# Patient Record
Sex: Female | Born: 1960
Health system: Southern US, Community
[De-identification: ages and names within clinical notes are randomized; demographics above are authoritative.]

## PROBLEM LIST (undated history)

## (undated) DIAGNOSIS — N951 Menopausal and female climacteric states: Secondary | ICD-10-CM

## (undated) DIAGNOSIS — M1990 Unspecified osteoarthritis, unspecified site: Secondary | ICD-10-CM

## (undated) DIAGNOSIS — E559 Vitamin D deficiency, unspecified: Secondary | ICD-10-CM

## (undated) DIAGNOSIS — R0609 Other forms of dyspnea: Secondary | ICD-10-CM

## (undated) DIAGNOSIS — G43909 Migraine, unspecified, not intractable, without status migrainosus: Secondary | ICD-10-CM

## (undated) DIAGNOSIS — J309 Allergic rhinitis, unspecified: Secondary | ICD-10-CM

## (undated) DIAGNOSIS — R002 Palpitations: Secondary | ICD-10-CM

## (undated) DIAGNOSIS — E669 Obesity, unspecified: Secondary | ICD-10-CM

## (undated) DIAGNOSIS — R5381 Other malaise: Secondary | ICD-10-CM

## (undated) DIAGNOSIS — D239 Other benign neoplasm of skin, unspecified: Secondary | ICD-10-CM

## (undated) DIAGNOSIS — N200 Calculus of kidney: Secondary | ICD-10-CM

## (undated) DIAGNOSIS — M76899 Other specified enthesopathies of unspecified lower limb, excluding foot: Secondary | ICD-10-CM

## (undated) DIAGNOSIS — K219 Gastro-esophageal reflux disease without esophagitis: Secondary | ICD-10-CM

## (undated) DIAGNOSIS — R42 Dizziness and giddiness: Secondary | ICD-10-CM

## (undated) DIAGNOSIS — R5383 Other fatigue: Secondary | ICD-10-CM

## (undated) DIAGNOSIS — R0989 Other specified symptoms and signs involving the circulatory and respiratory systems: Secondary | ICD-10-CM

## (undated) DIAGNOSIS — I10 Essential (primary) hypertension: Secondary | ICD-10-CM

## (undated) DIAGNOSIS — G5600 Carpal tunnel syndrome, unspecified upper limb: Secondary | ICD-10-CM

## (undated) DIAGNOSIS — M5030 Other cervical disc degeneration, unspecified cervical region: Secondary | ICD-10-CM

## (undated) DIAGNOSIS — N926 Irregular menstruation, unspecified: Secondary | ICD-10-CM

## (undated) DIAGNOSIS — N83209 Unspecified ovarian cyst, unspecified side: Secondary | ICD-10-CM

## (undated) DIAGNOSIS — Z8673 Personal history of transient ischemic attack (TIA), and cerebral infarction without residual deficits: Secondary | ICD-10-CM

## (undated) DIAGNOSIS — T7840XA Allergy, unspecified, initial encounter: Secondary | ICD-10-CM

## (undated) HISTORY — DX: Other forms of dyspnea: R06.09

## (undated) HISTORY — DX: Palpitations: R00.2

## (undated) HISTORY — DX: Carpal tunnel syndrome, unspecified upper limb: G56.00

## (undated) HISTORY — DX: Other cervical disc degeneration, unspecified cervical region: M50.30

## (undated) HISTORY — DX: Obesity, unspecified: E66.9

## (undated) HISTORY — DX: Other specified enthesopathies of unspecified lower limb, excluding foot: M76.899

## (undated) HISTORY — DX: Irregular menstruation, unspecified: N92.6

## (undated) HISTORY — DX: Menopausal and female climacteric states: N95.1

## (undated) HISTORY — DX: Dizziness and giddiness: R42

## (undated) HISTORY — DX: Other benign neoplasm of skin, unspecified: D23.9

## (undated) HISTORY — DX: Personal history of transient ischemic attack (TIA), and cerebral infarction without residual deficits: Z86.73

## (undated) HISTORY — DX: Calculus of kidney: N20.0

## (undated) HISTORY — DX: Unspecified ovarian cyst, unspecified side: N83.209

## (undated) HISTORY — DX: Other specified symptoms and signs involving the circulatory and respiratory systems: R09.89

## (undated) HISTORY — DX: Essential (primary) hypertension: I10

## (undated) HISTORY — DX: Allergy, unspecified, initial encounter: T78.40XA

## (undated) HISTORY — PX: TOOTH EXTRACTION: SUR596

## (undated) HISTORY — DX: Other fatigue: R53.83

## (undated) HISTORY — DX: Migraine, unspecified, not intractable, without status migrainosus: G43.909

## (undated) HISTORY — DX: Vitamin D deficiency, unspecified: E55.9

## (undated) HISTORY — DX: Gastro-esophageal reflux disease without esophagitis: K21.9

## (undated) HISTORY — DX: Unspecified osteoarthritis, unspecified site: M19.90

## (undated) HISTORY — DX: Allergic rhinitis, unspecified: J30.9

## (undated) HISTORY — DX: Other malaise: R53.81

---

## 1987-08-04 HISTORY — PX: TUBAL LIGATION: SHX77

## 2004-09-11 ENCOUNTER — Ambulatory Visit: Payer: Self-pay | Admitting: Internal Medicine

## 2006-02-10 ENCOUNTER — Ambulatory Visit: Payer: Self-pay | Admitting: Internal Medicine

## 2006-05-28 ENCOUNTER — Ambulatory Visit: Payer: Self-pay | Admitting: Internal Medicine

## 2007-01-26 ENCOUNTER — Ambulatory Visit: Payer: Self-pay | Admitting: Internal Medicine

## 2007-08-18 ENCOUNTER — Ambulatory Visit (HOSPITAL_COMMUNITY): Admission: RE | Admit: 2007-08-18 | Discharge: 2007-08-18 | Payer: Self-pay | Admitting: Urology

## 2008-10-30 ENCOUNTER — Ambulatory Visit: Payer: Self-pay | Admitting: Internal Medicine

## 2010-01-29 ENCOUNTER — Ambulatory Visit: Payer: Self-pay | Admitting: Internal Medicine

## 2010-11-05 ENCOUNTER — Emergency Department: Payer: Self-pay | Admitting: Unknown Physician Specialty

## 2010-11-20 ENCOUNTER — Observation Stay: Payer: Self-pay | Admitting: Internal Medicine

## 2011-06-03 ENCOUNTER — Ambulatory Visit: Payer: Self-pay | Admitting: Gastroenterology

## 2011-06-04 ENCOUNTER — Ambulatory Visit: Payer: Self-pay | Admitting: Family Medicine

## 2012-06-16 ENCOUNTER — Emergency Department: Payer: Self-pay | Admitting: Emergency Medicine

## 2012-06-16 LAB — URINALYSIS, COMPLETE
Bilirubin,UR: NEGATIVE
Ketone: NEGATIVE
Ph: 7 (ref 4.5–8.0)
Protein: NEGATIVE
Specific Gravity: 1.004 (ref 1.003–1.030)
Squamous Epithelial: 2

## 2012-06-16 LAB — CBC
HCT: 41.8 % (ref 35.0–47.0)
MCV: 85 fL (ref 80–100)
Platelet: 274 10*3/uL (ref 150–440)
RBC: 4.94 10*6/uL (ref 3.80–5.20)
RDW: 14.1 % (ref 11.5–14.5)
WBC: 7.2 10*3/uL (ref 3.6–11.0)

## 2012-06-16 LAB — BASIC METABOLIC PANEL
BUN: 9 mg/dL (ref 7–18)
Chloride: 107 mmol/L (ref 98–107)
EGFR (Non-African Amer.): >60
Glucose: 96 mg/dL (ref 65–99)
Potassium: 3.5 mmol/L (ref 3.5–5.1)
Sodium: 141 mmol/L (ref 136–145)

## 2012-06-16 LAB — CK TOTAL AND CKMB (NOT AT ARMC): CK-MB: 1.6 ng/mL (ref 0.5–3.6)

## 2012-06-16 LAB — TSH: Thyroid Stimulating Horm: 1.34 u[IU]/mL

## 2012-07-05 ENCOUNTER — Observation Stay: Payer: Self-pay | Admitting: Internal Medicine

## 2012-07-05 LAB — URINALYSIS, COMPLETE
Ketone: NEGATIVE
Nitrite: NEGATIVE
RBC,UR: 1 /HPF (ref 0–5)
Specific Gravity: 1.001 (ref 1.003–1.030)
WBC UR: 1 /HPF (ref 0–5)

## 2012-07-05 LAB — BASIC METABOLIC PANEL
Anion Gap: 7 (ref 7–16)
Calcium, Total: 8.7 mg/dL (ref 8.5–10.1)
Chloride: 106 mmol/L (ref 98–107)
Co2: 24 mmol/L (ref 21–32)
EGFR (Non-African Amer.): >60
Glucose: 87 mg/dL (ref 65–99)
Osmolality: 273 (ref 275–301)
Potassium: 3.5 mmol/L (ref 3.5–5.1)
Sodium: 137 mmol/L (ref 136–145)

## 2012-07-05 LAB — CK TOTAL AND CKMB (NOT AT ARMC)
CK, Total: 54 U/L (ref 21–215)
CK-MB: 1 ng/mL (ref 0.5–3.6)

## 2012-07-05 LAB — HEPATIC FUNCTION PANEL A (ARMC)
Albumin: 3.6 g/dL (ref 3.4–5.0)
Bilirubin, Direct: <0.1 mg/dL (ref 0.00–0.20)
Bilirubin,Total: 0.4 mg/dL (ref 0.2–1.0)
SGOT(AST): 20 U/L (ref 15–37)
Total Protein: 7.4 g/dL (ref 6.4–8.2)

## 2012-07-05 LAB — CBC
HCT: 39.7 % (ref 35.0–47.0)
HGB: 13.3 g/dL (ref 12.0–16.0)
MCH: 28.5 pg (ref 26.0–34.0)
MCHC: 33.5 g/dL (ref 32.0–36.0)
MCV: 85 fL (ref 80–100)

## 2012-07-05 LAB — TROPONIN I: Troponin-I: <0.02 ng/mL

## 2012-07-06 DIAGNOSIS — I6789 Other cerebrovascular disease: Secondary | ICD-10-CM

## 2012-07-06 LAB — BASIC METABOLIC PANEL
Anion Gap: 5 — ABNORMAL LOW (ref 7–16)
Calcium, Total: 8.5 mg/dL (ref 8.5–10.1)
Chloride: 108 mmol/L — ABNORMAL HIGH (ref 98–107)
EGFR (Non-African Amer.): >60
Glucose: 96 mg/dL (ref 65–99)
Osmolality: 278 (ref 275–301)
Potassium: 4 mmol/L (ref 3.5–5.1)

## 2012-07-06 LAB — CBC WITH DIFFERENTIAL/PLATELET
Basophil #: 0.1 10*3/uL (ref 0.0–0.1)
Basophil %: 1 %
Eosinophil #: 0.3 10*3/uL (ref 0.0–0.7)
Lymphocyte %: 24.7 %
MCH: 28.2 pg (ref 26.0–34.0)
MCHC: 33.2 g/dL (ref 32.0–36.0)
Monocyte #: 0.6 x10 3/mm (ref 0.2–0.9)
Neutrophil #: 4 10*3/uL (ref 1.4–6.5)
Neutrophil %: 60.3 %
Platelet: 220 10*3/uL (ref 150–440)
RDW: 14.1 % (ref 11.5–14.5)
WBC: 6.7 10*3/uL (ref 3.6–11.0)

## 2012-07-06 LAB — LIPASE, BLOOD: Lipase: 79 U/L (ref 73–393)

## 2012-07-06 LAB — TROPONIN I: Troponin-I: <0.02 ng/mL

## 2012-07-07 LAB — SEDIMENTATION RATE: Erythrocyte Sed Rate: 11 mm/hr (ref 0–20)

## 2012-07-14 ENCOUNTER — Encounter: Payer: Self-pay | Admitting: *Deleted

## 2012-07-15 ENCOUNTER — Ambulatory Visit (INDEPENDENT_AMBULATORY_CARE_PROVIDER_SITE_OTHER): Payer: BC Managed Care – PPO | Admitting: Cardiovascular Disease

## 2012-07-15 ENCOUNTER — Encounter: Payer: Self-pay | Admitting: Cardiovascular Disease

## 2012-07-15 VITALS — BP 160/100 | HR 63 | Ht 61.0 in | Wt 202.8 lb

## 2012-07-15 DIAGNOSIS — I10 Essential (primary) hypertension: Secondary | ICD-10-CM

## 2012-07-15 DIAGNOSIS — R002 Palpitations: Secondary | ICD-10-CM | POA: Insufficient documentation

## 2012-07-15 DIAGNOSIS — R0789 Other chest pain: Secondary | ICD-10-CM

## 2012-07-15 DIAGNOSIS — R0602 Shortness of breath: Secondary | ICD-10-CM

## 2012-07-15 DIAGNOSIS — R000 Tachycardia, unspecified: Secondary | ICD-10-CM

## 2012-07-15 MED ORDER — AMLODIPINE BESYLATE 5 MG PO TABS: 5.0000 mg | ORAL_TABLET | Freq: Every day | ORAL | Status: DC

## 2012-07-15 NOTE — Progress Notes (Signed)
HPI  This is a pleasant 51 year old Caucasian female who was referred by Dr. Carlynn Purl for evaluation of palpitations and hypertension. The patient overall has been healthy throughout her life up until April of last year when she presented to [facility] with right-sided weakness slurred speech. Neurologic evaluation was unremarkable and was discharged home. She improved but had recurrent symptoms 1 week later with suspected TIA. However, neurologic evaluation was unremarkable. She was seen by Dr. Clelia Croft and was diagnosed with basal migraine. Recently, she started having problems with elevated blood pressure. She had no previous history of hypertension. This came on suddenly with blood pressure readings as high as 210 systolic on some occasions. She was started on bisoprolol- hydrochlorothiazide. She was hospitalized recently at [facility] for recurrent symptoms of weakness. She was noted to be bradycardic and thus, bisoprolol was discontinued. She was noted to have normal blood pressure during her hospitalization. She had an MRI of the brain done which was normal. An echocardiogram showed normal LV systolic function without significant structural abnormalities. Since hospital discharge, the patient noted the increased palpitations. She had one prolonged episode of palpitation while she was at church. She felt that her heart was going extremely fast and felt dizzy. She is not aware of any previous history of cardiac arrhythmia.  Allergies  Allergen Reactions  . Codeine      Current Outpatient Prescriptions on File Prior to Visit  Medication Sig Dispense Refill  . aspirin 81 MG tablet Take 81 mg by mouth as needed.       . Cholecalciferol (VITAMIN D) 2000 UNITS tablet Take 2,000 Units by mouth as needed.       . cyclobenzaprine (FLEXERIL) 10 MG tablet Take 10 mg by mouth as needed.       . hydrochlorothiazide (HYDRODIURIL) 12.5 MG tablet Take 12.5 mg by mouth daily.      Marland Kitchen ibuprofen (ADVIL,MOTRIN) 200 MG tablet  Take 200 mg by mouth every 6 (six) hours as needed.      . nortriptyline (PAMELOR) 10 MG capsule Take 10 mg by mouth at bedtime.      Marland Kitchen omeprazole (PRILOSEC) 20 MG capsule Take 20 mg by mouth daily.      . sodium chloride (OCEAN) 0.65 % nasal spray Place 1 spray into the nose as needed.      Marland Kitchen amLODipine (NORVASC) 5 MG tablet Take 1 tablet (5 mg total) by mouth daily.  30 tablet  6     Past Medical History  Diagnosis Date  . Degeneration of cervical intervertebral disc   . Calculus of kidney   . Migraine, unspecified, without mention of intractable migraine without mention of status migrainosus   . Other dyspnea and respiratory abnormality   . Unspecified vitamin D deficiency   . Migraine with aura, without mention of intractable migraine without mention of status migrainosus   . Enthesopathy of hip region   . Other malaise and fatigue   . Allergic rhinitis, cause unspecified   . Irregular menstrual cycle   . Esophageal reflux   . Benign neoplasm of skin, site unspecified   . Carpal tunnel syndrome   . Symptomatic menopausal or female climacteric states   . Obesity, unspecified   . Transient ischemic attack (TIA), and cerebral infarction without residual deficits   . Palpitations   . Essential hypertension, malignant   . Dizziness and giddiness      Past Surgical History  Procedure Date  . Tubal ligation   . Tooth  extraction      Family History  Problem Relation Age of Onset  . Asthma Mother   . Heart disease Mother   . Hyperlipidemia Mother   . Asthma Son   . Heart attack Father      History   Social History  . Marital Status: Married    Spouse Name: N/A    Number of Children: N/A  . Years of Education: N/A   Occupational History  . Not on file.   Social History Main Topics  . Smoking status: Never Smoker   . Smokeless tobacco: Not on file  . Alcohol Use: No  . Drug Use: No  . Sexually Active:    Other Topics Concern  . Not on file   Social  History Narrative  . No narrative on file     ROS Constitutional: Negative for fever, chills, diaphoresis, activity change, appetite change . HENT: Negative for hearing loss, nosebleeds, congestion, sore throat, facial swelling, drooling, trouble swallowing, neck pain, voice change, sinus pressure and tinnitus.  Eyes: Negative for photophobia, pain, discharge and visual disturbance.  Respiratory: Negative for apnea, cough, chest tightness and wheezing.  Cardiovascular: Negative for chest pain and leg swelling.  Gastrointestinal: Negative for nausea, vomiting, abdominal pain, diarrhea, constipation, blood in stool and abdominal distention.  Genitourinary: Negative for dysuria, urgency, frequency, hematuria and decreased urine volume.  Musculoskeletal: Negative for myalgias, back pain, joint swelling, arthralgias and gait problem.  Skin: Negative for color change, pallor, rash and wound.  Neurological: Negative for dizziness, tremors, seizures, syncope, speech difficulty, weakness, light-headedness, numbness and headaches.  Psychiatric/Behavioral: Negative for suicidal ideas, hallucinations, behavioral problems and agitation. The patient is not nervous/anxious.    PHYSICAL EXAM   BP 160/100  Pulse 63  Ht 5\' 1"  (1.549 m)  Wt 202 lb 12 oz (91.967 kg)  BMI 38.31 kg/m2  Constitutional: She is oriented to person, place, and time. She appears well-developed and well-nourished. No distress.  HENT: No nasal discharge.  Head: Normocephalic and atraumatic.  Eyes: Pupils are equal and round. Right eye exhibits no discharge. Left eye exhibits no discharge.  Neck: Normal range of motion. Neck supple. No JVD present. No thyromegaly present.  Cardiovascular: Normal rate, regular rhythm, normal heart sounds. Exam reveals no gallop and no friction rub. No murmur heard.  Pulmonary/Chest: Effort normal and breath sounds normal. No stridor. No respiratory distress. She has no wheezes. She has no rales.  She exhibits no tenderness.  Abdominal: Soft. Bowel sounds are normal. She exhibits no distension. There is no tenderness. There is no rebound and no guarding.  Musculoskeletal: Normal range of motion. She exhibits no edema and no tenderness.  Neurological: She is alert and oriented to person, place, and time. Coordination normal.  Skin: Skin is warm and dry. No rash noted. She is not diaphoretic. No erythema. No pallor.  Psychiatric: She has a normal mood and affect. Her behavior is normal. Judgment and thought content normal.      EKG: Sinus  Rhythm  WITHIN NORMAL LIMITS   ASSESSMENT AND PLAN

## 2012-07-15 NOTE — Patient Instructions (Addendum)
Start Amlodipine 5 mg once daily.   Your physician has requested that you have a renal artery duplex. During this test, an ultrasound is used to evaluate blood flow to the kidneys. Allow one hour for this exam. Do not eat after midnight the day before and avoid carbonated beverages. Take your medications as you usually do.  Your physician has recommended that you wear a holter monitor. Holter monitors are medical devices that record the heart's electrical activity. Doctors most often use these monitors to diagnose arrhythmias. Arrhythmias are problems with the speed or rhythm of the heartbeat. The monitor is a small, portable device. You can wear one while you do your normal daily activities. This is usually used to diagnose what is causing palpitations/syncope (passing out).  Labs today.   Follow up after tests.

## 2012-07-15 NOTE — Assessment & Plan Note (Signed)
The patient is having increased palpitations especially after stopping Bisoprolol recently due to bradycardia. She had one prolonged episode of tachycardia of unclear etiology. Recent labs were unremarkable. Echocardiogram was normal. I recommend further evaluation with a 48-hour Holter monitor.

## 2012-07-15 NOTE — Assessment & Plan Note (Signed)
This is an unusual presentation overall with labile hypertension and occasionally very high readings. She does not have family history of hypertension at this age. I definitely think we should evaluate her for secondary hypertension. I will request Aldosterone/Renin ratio. I will also obtain a renal artery duplex ultrasound to rule out fibromuscular dysplasia. Her blood pressure is significantly elevated and thus I will add amlodipine 5 mg once daily. If she continues to have significant fluctuations of her blood pressure, the next step would be to evaluate her for pheochromocytoma with 24 hour urine collection.

## 2012-07-25 ENCOUNTER — Encounter: Payer: Self-pay | Admitting: Cardiovascular Disease

## 2012-07-28 ENCOUNTER — Telehealth: Payer: Self-pay | Admitting: *Deleted

## 2012-07-28 ENCOUNTER — Encounter (INDEPENDENT_AMBULATORY_CARE_PROVIDER_SITE_OTHER): Payer: BC Managed Care – PPO

## 2012-07-28 DIAGNOSIS — I10 Essential (primary) hypertension: Secondary | ICD-10-CM

## 2012-07-28 NOTE — Telephone Encounter (Signed)
LMTCB to inform pt that she was to have 48 hour holter monitor placed on 07/28/12 after echo. Pt left before having monitor placed. Called to see when pt can come by [facility] in [place] to have monitor placed.

## 2012-08-01 ENCOUNTER — Other Ambulatory Visit: Payer: Self-pay | Admitting: *Deleted

## 2012-08-01 ENCOUNTER — Encounter (INDEPENDENT_AMBULATORY_CARE_PROVIDER_SITE_OTHER): Payer: BC Managed Care – PPO

## 2012-08-01 DIAGNOSIS — R002 Palpitations: Secondary | ICD-10-CM

## 2012-08-01 NOTE — Progress Notes (Unsigned)
Placed 48 hour holter montior 08/01/2012 to be taken off on 08/05/2011 for Dr. Kirke Corin. Palpitations.

## 2012-08-02 ENCOUNTER — Other Ambulatory Visit: Payer: Self-pay

## 2012-08-02 DIAGNOSIS — N261 Atrophy of kidney (terminal): Secondary | ICD-10-CM

## 2012-08-05 ENCOUNTER — Ambulatory Visit (HOSPITAL_COMMUNITY)
Admission: RE | Admit: 2012-08-05 | Discharge: 2012-08-05 | Disposition: A | Discharge status: Home / Self Care | Payer: BC Managed Care – PPO | Source: Ambulatory Visit | Attending: Cardiovascular Disease | Admitting: Cardiovascular Disease

## 2012-08-05 DIAGNOSIS — N269 Renal sclerosis, unspecified: Secondary | ICD-10-CM | POA: Insufficient documentation

## 2012-08-05 DIAGNOSIS — N261 Atrophy of kidney (terminal): Secondary | ICD-10-CM

## 2012-08-12 ENCOUNTER — Ambulatory Visit: Payer: Self-pay | Admitting: Family Medicine

## 2012-08-15 ENCOUNTER — Ambulatory Visit (INDEPENDENT_AMBULATORY_CARE_PROVIDER_SITE_OTHER): Payer: BC Managed Care – PPO | Admitting: Cardiovascular Disease

## 2012-08-15 ENCOUNTER — Encounter: Payer: Self-pay | Admitting: Cardiovascular Disease

## 2012-08-15 VITALS — BP 132/80 | HR 65 | Ht 62.0 in | Wt 203.0 lb

## 2012-08-15 DIAGNOSIS — R002 Palpitations: Secondary | ICD-10-CM

## 2012-08-15 DIAGNOSIS — I10 Essential (primary) hypertension: Secondary | ICD-10-CM

## 2012-08-15 NOTE — Patient Instructions (Addendum)
Continue same medications.   Follow up as needed.  

## 2012-08-16 ENCOUNTER — Encounter: Payer: Self-pay | Admitting: Cardiovascular Disease

## 2012-08-16 NOTE — Progress Notes (Signed)
HPI  This is a pleasant 52 year old Caucasian female who is here today for a followup visit regarding palpitations and hypertension. The patient overall has been healthy throughout her life up until April of last year when she presented to [facility] with right-sided weakness slurred speech. Neurologic evaluation was unremarkable and was discharged home. She improved but had recurrent symptoms 1 week later with suspected TIA. However, neurologic evaluation was unremarkable. She was seen by Dr. Sherryll Burger and was diagnosed with basal migraine. Recently, she started having problems with elevated blood pressure. She had no previous history of hypertension. This came on suddenly with blood pressure readings as high as 210 systolic on some occasions. She was started on bisoprolol- hydrochlorothiazide. She was hospitalized recently at [facility] for recurrent symptoms of weakness. She was noted to be bradycardic and thus, bisoprolol was discontinued. She was noted to have normal blood pressure during her hospitalization. She had an MRI of the brain done which was normal. An echocardiogram showed normal LV systolic function without significant structural abnormalities. Since hospital discharge, the patient noted the increased palpitations.  Thus, a Holter monitor was done which showed only some PACs and PVCs. During her last visit, her blood pressure was elevated. I added amlodipine 5 mg once daily. Since then, her blood pressure has been reasonably controlled. The patient seems to be doing well at this time. Renal artery duplex ultrasound showed no evidence of renal artery stenosis.  Allergies  Allergen Reactions  . Codeine      Current Outpatient Prescriptions on File Prior to Visit  Medication Sig Dispense Refill  . amLODipine (NORVASC) 5 MG tablet Take 1 tablet (5 mg total) by mouth daily.  30 tablet  6  . aspirin 81 MG tablet Take 81 mg by mouth as needed.       . Cholecalciferol (VITAMIN D) 2000 UNITS tablet Take  2,000 Units by mouth as needed.       . cyclobenzaprine (FLEXERIL) 10 MG tablet Take 10 mg by mouth as needed.       . hydrochlorothiazide (HYDRODIURIL) 12.5 MG tablet Take 12.5 mg by mouth daily.      Marland Kitchen ibuprofen (ADVIL,MOTRIN) 200 MG tablet Take 200 mg by mouth every 6 (six) hours as needed.      . nortriptyline (PAMELOR) 10 MG capsule Take 10 mg by mouth at bedtime.      Marland Kitchen omeprazole (PRILOSEC) 20 MG capsule Take 20 mg by mouth daily.      . sodium chloride (OCEAN) 0.65 % nasal spray Place 1 spray into the nose as needed.         Past Medical History  Diagnosis Date  . Degeneration of cervical intervertebral disc   . Calculus of kidney   . Migraine, unspecified, without mention of intractable migraine without mention of status migrainosus   . Other dyspnea and respiratory abnormality   . Unspecified vitamin D deficiency   . Migraine with aura, without mention of intractable migraine without mention of status migrainosus   . Enthesopathy of hip region   . Other malaise and fatigue   . Allergic rhinitis, cause unspecified   . Irregular menstrual cycle   . Esophageal reflux   . Benign neoplasm of skin, site unspecified   . Carpal tunnel syndrome   . Symptomatic menopausal or female climacteric states   . Obesity, unspecified   . Transient ischemic attack (TIA), and cerebral infarction without residual deficits   . Palpitations   . Essential hypertension, malignant   .  Dizziness and giddiness   . Ovarian cyst      Past Surgical History  Procedure Date  . Tubal ligation   . Tooth extraction      Family History  Problem Relation Age of Onset  . Asthma Mother   . Heart disease Mother   . Hyperlipidemia Mother   . Asthma Son   . Heart attack Father      History   Social History  . Marital Status: Married    Spouse Name: N/A    Number of Children: N/A  . Years of Education: N/A   Occupational History  . Not on file.   Social History Main Topics  . Smoking  status: Never Smoker   . Smokeless tobacco: Not on file  . Alcohol Use: No  . Drug Use: No  . Sexually Active:    Other Topics Concern  . Not on file   Social History Narrative  . No narrative on file        PHYSICAL EXAM   BP 132/80  Pulse 65  Ht 5\' 2"  (1.575 m)  Wt 203 lb (92.08 kg)  BMI 37.13 kg/m2  Constitutional: She is oriented to person, place, and time. She appears well-developed and well-nourished. No distress.  HENT: No nasal discharge.  Head: Normocephalic and atraumatic.  Eyes: Pupils are equal and round. Right eye exhibits no discharge. Left eye exhibits no discharge.  Neck: Normal range of motion. Neck supple. No JVD present. No thyromegaly present.  Cardiovascular: Normal rate, regular rhythm, normal heart sounds. Exam reveals no gallop and no friction rub. No murmur heard.  Pulmonary/Chest: Effort normal and breath sounds normal. No stridor. No respiratory distress. She has no wheezes. She has no rales. She exhibits no tenderness.  Abdominal: Soft. Bowel sounds are normal. She exhibits no distension. There is no tenderness. There is no rebound and no guarding.  Musculoskeletal: Normal range of motion. She exhibits no edema and no tenderness.  Neurological: She is alert and oriented to person, place, and time. Coordination normal.  Skin: Skin is warm and dry. No rash noted. She is not diaphoretic. No erythema. No pallor.  Psychiatric: She has a normal mood and affect. Her behavior is normal. Judgment and thought content normal.      ASSESSMENT AND PLAN

## 2012-08-16 NOTE — Assessment & Plan Note (Signed)
Workup for secondary hypertension has been negative. There was no evidence of renal artery stenosis. Aldosterone to renin ratio was normal. Her blood pressure is now reasonably controlled on amlodipine and small dose hydrochlorothiazide. I recommend continuing both medications.

## 2012-08-16 NOTE — Assessment & Plan Note (Signed)
Holter monitor showed only mild PACs and PVCs. Echocardiogram showed no evidence of structural heart abnormalities.  Followup as needed.

## 2013-01-12 ENCOUNTER — Emergency Department: Payer: Self-pay | Admitting: Emergency Medicine

## 2013-01-12 LAB — CBC
HGB: 13.3 g/dL (ref 12.0–16.0)
MCHC: 33.5 g/dL (ref 32.0–36.0)
MCV: 83 fL (ref 80–100)
RDW: 13.6 % (ref 11.5–14.5)
WBC: 9 10*3/uL (ref 3.6–11.0)

## 2013-01-12 LAB — URINALYSIS, COMPLETE
Bilirubin,UR: NEGATIVE
Ketone: NEGATIVE
Nitrite: NEGATIVE
RBC,UR: 3598 /HPF (ref 0–5)
Specific Gravity: 1.021 (ref 1.003–1.030)
Squamous Epithelial: 5
WBC UR: 54 /HPF (ref 0–5)

## 2013-01-12 LAB — COMPREHENSIVE METABOLIC PANEL
Albumin: 3.5 g/dL (ref 3.4–5.0)
Alkaline Phosphatase: 68 U/L (ref 50–136)
Anion Gap: 10 (ref 7–16)
BUN: 14 mg/dL (ref 7–18)
Calcium, Total: 9.1 mg/dL (ref 8.5–10.1)
Chloride: 105 mmol/L (ref 98–107)
Co2: 23 mmol/L (ref 21–32)
Glucose: 159 mg/dL — ABNORMAL HIGH (ref 65–99)
Osmolality: 280 (ref 275–301)
Potassium: 3.1 mmol/L — ABNORMAL LOW (ref 3.5–5.1)
SGOT(AST): 19 U/L (ref 15–37)
SGPT (ALT): 24 U/L (ref 12–78)
Sodium: 138 mmol/L (ref 136–145)

## 2013-01-12 LAB — LIPASE, BLOOD: Lipase: 88 U/L (ref 73–393)

## 2013-01-18 ENCOUNTER — Observation Stay: Payer: Self-pay | Admitting: Urology

## 2013-01-18 LAB — BASIC METABOLIC PANEL
Anion Gap: 5 — ABNORMAL LOW (ref 7–16)
BUN: 15 mg/dL (ref 7–18)
Calcium, Total: 9 mg/dL (ref 8.5–10.1)
Chloride: 105 mmol/L (ref 98–107)
Co2: 28 mmol/L (ref 21–32)
EGFR (African American): 48 — ABNORMAL LOW
Glucose: 88 mg/dL (ref 65–99)
Osmolality: 276 (ref 275–301)
Potassium: 3.8 mmol/L (ref 3.5–5.1)

## 2013-05-05 ENCOUNTER — Emergency Department (HOSPITAL_COMMUNITY)
Admission: EM | Admit: 2013-05-05 | Discharge: 2013-05-05 | Disposition: A | Discharge status: Home / Self Care | Payer: BC Managed Care – PPO | Attending: Emergency Medicine | Admitting: Emergency Medicine

## 2013-05-05 ENCOUNTER — Encounter (HOSPITAL_COMMUNITY): Payer: Self-pay | Admitting: Emergency Medicine

## 2013-05-05 ENCOUNTER — Emergency Department (HOSPITAL_COMMUNITY): Payer: BC Managed Care – PPO

## 2013-05-05 DIAGNOSIS — Z8709 Personal history of other diseases of the respiratory system: Secondary | ICD-10-CM | POA: Insufficient documentation

## 2013-05-05 DIAGNOSIS — Z8679 Personal history of other diseases of the circulatory system: Secondary | ICD-10-CM | POA: Insufficient documentation

## 2013-05-05 DIAGNOSIS — Z8742 Personal history of other diseases of the female genital tract: Secondary | ICD-10-CM | POA: Insufficient documentation

## 2013-05-05 DIAGNOSIS — Z87442 Personal history of urinary calculi: Secondary | ICD-10-CM | POA: Insufficient documentation

## 2013-05-05 DIAGNOSIS — G43909 Migraine, unspecified, not intractable, without status migrainosus: Secondary | ICD-10-CM | POA: Insufficient documentation

## 2013-05-05 DIAGNOSIS — E669 Obesity, unspecified: Secondary | ICD-10-CM | POA: Insufficient documentation

## 2013-05-05 DIAGNOSIS — R5381 Other malaise: Secondary | ICD-10-CM | POA: Insufficient documentation

## 2013-05-05 DIAGNOSIS — R209 Unspecified disturbances of skin sensation: Secondary | ICD-10-CM

## 2013-05-05 DIAGNOSIS — Z8669 Personal history of other diseases of the nervous system and sense organs: Secondary | ICD-10-CM | POA: Insufficient documentation

## 2013-05-05 DIAGNOSIS — R51 Headache: Secondary | ICD-10-CM

## 2013-05-05 DIAGNOSIS — K219 Gastro-esophageal reflux disease without esophagitis: Secondary | ICD-10-CM | POA: Insufficient documentation

## 2013-05-05 DIAGNOSIS — Z7982 Long term (current) use of aspirin: Secondary | ICD-10-CM | POA: Insufficient documentation

## 2013-05-05 DIAGNOSIS — I10 Essential (primary) hypertension: Secondary | ICD-10-CM

## 2013-05-05 DIAGNOSIS — Z79899 Other long term (current) drug therapy: Secondary | ICD-10-CM | POA: Insufficient documentation

## 2013-05-05 DIAGNOSIS — Z862 Personal history of diseases of the blood and blood-forming organs and certain disorders involving the immune mechanism: Secondary | ICD-10-CM | POA: Insufficient documentation

## 2013-05-05 DIAGNOSIS — Z8673 Personal history of transient ischemic attack (TIA), and cerebral infarction without residual deficits: Secondary | ICD-10-CM | POA: Insufficient documentation

## 2013-05-05 DIAGNOSIS — Z8739 Personal history of other diseases of the musculoskeletal system and connective tissue: Secondary | ICD-10-CM | POA: Insufficient documentation

## 2013-05-05 DIAGNOSIS — Z8639 Personal history of other endocrine, nutritional and metabolic disease: Secondary | ICD-10-CM | POA: Insufficient documentation

## 2013-05-05 DIAGNOSIS — R079 Chest pain, unspecified: Secondary | ICD-10-CM | POA: Insufficient documentation

## 2013-05-05 LAB — CBC WITH DIFFERENTIAL/PLATELET
Basophils Relative: 1 % (ref 0–1)
Eosinophils Absolute: 0.3 10*3/uL (ref 0.0–0.7)
HCT: 38 % (ref 36.0–46.0)
Hemoglobin: 13.1 g/dL (ref 12.0–15.0)
Lymphocytes Relative: 32 % (ref 12–46)
Lymphs Abs: 2.2 10*3/uL (ref 0.7–4.0)
MCH: 28.5 pg (ref 26.0–34.0)
MCHC: 34.5 g/dL (ref 30.0–36.0)
Monocytes Relative: 9 % (ref 3–12)
Neutrophils Relative %: 54 % (ref 43–77)
Platelets: 276 10*3/uL (ref 150–400)
RBC: 4.6 MIL/uL (ref 3.87–5.11)

## 2013-05-05 LAB — URINE MICROSCOPIC-ADD ON

## 2013-05-05 LAB — URINALYSIS, ROUTINE W REFLEX MICROSCOPIC
Glucose, UA: NEGATIVE mg/dL
Nitrite: NEGATIVE
Specific Gravity, Urine: 1.005 (ref 1.005–1.030)
pH: 7.5 (ref 5.0–8.0)

## 2013-05-05 LAB — GLUCOSE, CAPILLARY: Glucose-Capillary: 80 mg/dL (ref 70–99)

## 2013-05-05 LAB — BASIC METABOLIC PANEL
BUN: 10 mg/dL (ref 6–23)
CO2: 25 mEq/L (ref 19–32)
Chloride: 103 mEq/L (ref 96–112)
GFR calc non Af Amer: >90 mL/min (ref >90)
Glucose, Bld: 86 mg/dL (ref 70–99)
Potassium: 3.6 mEq/L (ref 3.5–5.1)
Sodium: 137 mEq/L (ref 135–145)

## 2013-05-05 LAB — PROTIME-INR
INR: 0.98 (ref 0.00–1.49)
Prothrombin Time: 12.8 seconds (ref 11.6–15.2)

## 2013-05-05 MED ORDER — ASPIRIN 81 MG PO CHEW
324.0000 mg | CHEWABLE_TABLET | Freq: Once | ORAL | Status: AC
  Administered 2013-05-05: 324 mg via ORAL
  Filled 2013-05-05: qty 4

## 2013-05-05 MED ORDER — PROCHLORPERAZINE EDISYLATE 5 MG/ML IJ SOLN
10.0000 mg | Freq: Four times a day (QID) | INTRAMUSCULAR | Status: DC | PRN
  Administered 2013-05-05: 10 mg via INTRAVENOUS
  Filled 2013-05-05: qty 2

## 2013-05-05 MED ORDER — DIPHENHYDRAMINE HCL 50 MG/ML IJ SOLN
25.0000 mg | Freq: Once | INTRAMUSCULAR | Status: AC
  Administered 2013-05-05: 25 mg via INTRAVENOUS
  Filled 2013-05-05: qty 1

## 2013-05-05 MED ORDER — GADOBENATE DIMEGLUMINE 529 MG/ML IV SOLN
20.0000 mL | Freq: Once | INTRAVENOUS | Status: AC
  Administered 2013-05-05: 20 mL via INTRAVENOUS

## 2013-05-05 NOTE — ED Notes (Signed)
Pt remains alert and oriented x's 3.  Pt st's she is started to have a headache.  Skin warm and dry color appropriate.  Family remains at bedside.

## 2013-05-05 NOTE — ED Provider Notes (Signed)
CSN: [id number]     Arrival date & time 05/05/13  1218 History   First MD Initiated Contact with Patient 05/05/13 1222     No chief complaint on file.  (Consider location/radiation/quality/duration/timing/severity/associated sxs/prior Treatment) HPI Comments: She presents to the ER for evaluation of possible code stroke. Patient had sudden onset of right sided arm and leg numbness and weakness at 8 AM. She presented first to her primary care physician who called EMS to bring her to the emergency department. She presents to the ER by EMS. They report that she did have right-sided deficits on examination initially, but during transit her symptoms have not resolved. Patient is no longer feeling any numbness or tingling, weakness in her extremities. She is now feeling a slight discomfort in the center of her chest however. No shortness of breath.   Past Medical History  Diagnosis Date  . Degeneration of cervical intervertebral disc   . Calculus of kidney   . Migraine, unspecified, without mention of intractable migraine without mention of status migrainosus   . Other dyspnea and respiratory abnormality   . Unspecified vitamin D deficiency   . Migraine with aura, without mention of intractable migraine without mention of status migrainosus   . Enthesopathy of hip region   . Other malaise and fatigue   . Allergic rhinitis, cause unspecified   . Irregular menstrual cycle   . Esophageal reflux   . Benign neoplasm of skin, site unspecified   . Carpal tunnel syndrome   . Symptomatic menopausal or female climacteric states   . Obesity, unspecified   . Transient ischemic attack (TIA), and cerebral infarction without residual deficits   . Palpitations   . Essential hypertension, malignant   . Dizziness and giddiness   . Ovarian cyst    Past Surgical History  Procedure Laterality Date  . Tubal ligation    . Tooth extraction     Family History  Problem Relation Age of Onset  . Asthma Mother    . Heart disease Mother   . Hyperlipidemia Mother   . Asthma Son   . Heart attack Father    History  Substance Use Topics  . Smoking status: Never Smoker   . Smokeless tobacco: Not on file  . Alcohol Use: No   OB History   Grav Para Term Preterm Abortions TAB SAB Ect Mult Living                 Review of Systems  Respiratory: Negative for shortness of breath.   Cardiovascular: Positive for chest pain.  Neurological: Positive for weakness and numbness.  All other systems reviewed and are negative.    Allergies  Codeine  Home Medications   Current Outpatient Rx  Name  Route  Sig  Dispense  Refill  . amLODipine (NORVASC) 5 MG tablet   Oral   Take 1 tablet (5 mg total) by mouth daily.   30 tablet   6   . aspirin 81 MG tablet   Oral   Take 81 mg by mouth as needed.          . Cholecalciferol (VITAMIN D) 2000 UNITS tablet   Oral   Take 2,000 Units by mouth as needed.          . cyclobenzaprine (FLEXERIL) 10 MG tablet   Oral   Take 10 mg by mouth as needed.          . hydrochlorothiazide (HYDRODIURIL) 12.5 MG tablet   Oral  Take 12.5 mg by mouth daily.         Marland Kitchen ibuprofen (ADVIL,MOTRIN) 200 MG tablet   Oral   Take 200 mg by mouth every 6 (six) hours as needed.         . nortriptyline (PAMELOR) 10 MG capsule   Oral   Take 10 mg by mouth at bedtime.         Marland Kitchen omeprazole (PRILOSEC) 20 MG capsule   Oral   Take 20 mg by mouth daily.         . sodium chloride (OCEAN) 0.65 % nasal spray   Nasal   Place 1 spray into the nose as needed.          There were no vitals taken for this visit. Physical Exam  Constitutional: She is oriented to person, place, and time. She appears well-developed and well-nourished. No distress.  HENT:  Head: Normocephalic and atraumatic.  Right Ear: Hearing normal.  Left Ear: Hearing normal.  Nose: Nose normal.  Mouth/Throat: Oropharynx is clear and moist and mucous membranes are normal.  Eyes: Conjunctivae and  EOM are normal. Pupils are equal, round, and reactive to light.  Neck: Normal range of motion. Neck supple.  Cardiovascular: Regular rhythm, S1 normal and S2 normal.  Exam reveals no gallop and no friction rub.   No murmur heard. Pulmonary/Chest: Effort normal and breath sounds normal. No respiratory distress. She exhibits no tenderness.  Abdominal: Soft. Normal appearance and bowel sounds are normal. There is no hepatosplenomegaly. There is no tenderness. There is no rebound, no guarding, no tenderness at McBurney's point and negative Murphy's sign. No hernia.  Musculoskeletal: Normal range of motion.  Neurological: She is alert and oriented to person, place, and time. She has normal strength. No cranial nerve deficit or sensory deficit. Coordination normal. GCS eye subscore is 4. GCS verbal subscore is 5. GCS motor subscore is 6.  Skin: Skin is warm, dry and intact. No rash noted. No cyanosis.  Psychiatric: She has a normal mood and affect. Her speech is normal and behavior is normal. Thought content normal.    ED Course  Procedures (including critical care time)  EKG:  Date: 05/05/2013  Rate: 67  Rhythm: normal sinus rhythm  QRS Axis: normal  Intervals: normal  ST/T Wave abnormalities: normal  Conduction Disutrbances: none  Narrative Interpretation: unremarkable     Labs Review Labs Reviewed  URINALYSIS, ROUTINE W REFLEX MICROSCOPIC - Abnormal; Notable for the following:    Hgb urine dipstick SMALL (*)    All other components within normal limits  URINE MICROSCOPIC-ADD ON - Abnormal; Notable for the following:    Squamous Epithelial / LPF FEW (*)    All other components within normal limits  CBC WITH DIFFERENTIAL  BASIC METABOLIC PANEL  TROPONIN I  PROTIME-INR  GLUCOSE, CAPILLARY   Imaging Review Dg Chest 2 View  05/05/2013   CLINICAL DATA:  Chest pain, tightness, dizziness and weakness.  EXAM: CHEST - 2 VIEW  COMPARISON:  None  FINDINGS: The heart size and mediastinal  contours are within normal limits. Mild interstitial prominence may reflect chronic disease. There is no evidence of pulmonary edema, consolidation, pneumothorax, nodule or pleural fluid. The visualized skeletal structures are unremarkable.  IMPRESSION: No active disease. Mild interstitial prominence present which may reflect chronic lung disease.   Electronically Signed   By: Irish Lack M.D.   On: 05/05/2013 13:08   Ct Head (brain) Wo Contrast  05/05/2013   CLINICAL DATA:  And code stroke. Right-sided weakness and stumbling to the right.  EXAM: CT HEAD WITHOUT CONTRAST  TECHNIQUE: Contiguous axial images were obtained from the base of the skull through the vertex without intravenous contrast.  COMPARISON:  None available.  FINDINGS: No acute cortical infarct, hemorrhage, or mass lesion is present. The ventricles are of normal size. No significant extra-axial fluid collection is evident. The paranasal sinuses and mastoid air cells are clear. The osseous skull is intact.  IMPRESSION: Negative CT of the head.  These results were called by telephone at the time of interpretation on 05/05/2013 at 12:35 PM to Dr. Amada Jupiter, who verbally acknowledged these results.   Electronically Signed   By: Gennette Pac M.D.   On: 05/05/2013 12:37   Mr Maxine Glenn Head Wo Contrast  05/05/2013   CLINICAL DATA:  Right-sided weakness. Gait abnormality. Stroke risk factors include hypertension.  EXAM: MRI HEAD WITHOUT AND WITH CONTRAST  MRA HEAD WITHOUT CONTRAST  MRA NECK WITHOUT AND WITH CONTRAST  TECHNIQUE: Multiplanar, multiecho pulse sequences of the brain and surrounding structures were obtained without and with intravenous contrast. Angiographic images of the Circle of Willis were obtained using MRA technique without intravenous contrast. Angiographic images of the neck were obtained using MRA technique without and with intravenous contrast. Carotid stenosis measurements (when applicable) are obtained utilizing NASCET  criteria, using the distal internal carotid diameter as the denominator.  CONTRAST:  20mL MULTIHANCE GADOBENATE DIMEGLUMINE 529 MG/ML IV SOLN  COMPARISON:  CT head 05/05/2013.  FINDINGS: MRI HEAD FINDINGS  No evidence for acute infarction, hemorrhage, mass lesion, hydrocephalus, or extra-axial fluid. There is no atrophy or white matter disease. Flow voids are maintained. No focus of chronic hemorrhage. Normal-appearing calvarium, skull base, and upper cervical region. Pituitary and cerebellar tonsils unremarkable. Post infusion, no abnormal enhancement of the brain or meninges. Major dural venous sinuses patent. Negative orbits, sinuses, and mastoids.  MRA HEAD FINDINGS  The internal carotid arteries are widely patent. The basilar artery is widely patent with both vertebrals contributing to its formation. There is no intracranial stenosis observed.  Two aneurysmal outpouchings are seen from the left internal carotid artery. In the inferior cavernous segment a wide neck aneurysm measures 3 mm and points inferiorly. At the origin of the left posterior communicating artery, a 2 mm outpouching is present which could represent an infundibulum but aneurysm is not excluded.  MRA NECK FINDINGS  Conventional branching great vessels from the arch. No proximal stenosis. Transverse arch unremarkable.  Internal carotid artery bifurcations widely patent. No evidence for cervical ICA fibromuscular dysplasia or dissection.  Both vertebral arteries widely patent through the neck. Tortuosity of the proximal vertebral segments results in some signal dropout but there is no ostial stenosis.  IMPRESSION: MRI HEAD IMPRESSION  No acute stroke or hemorrhage. No abnormal postcontrast enhancement. No intracranial abnormality is detected to explain the patient's symptoms.  MRA HEAD IMPRESSION  No intracranial stenosis or dissection. 3 mm inferior cavernous LICA aneurysm. 2 mm left PCom ICA aneurysm versus infundibulum.  MRA NECK IMPRESSION   Unremarkable MRA extracranial circulation.   Electronically Signed   By: Davonna Belling M.D.   On: 05/05/2013 15:51   Mr Angiogram Neck W Wo Contrast  05/05/2013   CLINICAL DATA:  Right-sided weakness. Gait abnormality. Stroke risk factors include hypertension.  EXAM: MRI HEAD WITHOUT AND WITH CONTRAST  MRA HEAD WITHOUT CONTRAST  MRA NECK WITHOUT AND WITH CONTRAST  TECHNIQUE: Multiplanar, multiecho pulse sequences of the brain and surrounding structures were obtained  without and with intravenous contrast. Angiographic images of the Circle of Willis were obtained using MRA technique without intravenous contrast. Angiographic images of the neck were obtained using MRA technique without and with intravenous contrast. Carotid stenosis measurements (when applicable) are obtained utilizing NASCET criteria, using the distal internal carotid diameter as the denominator.  CONTRAST:  20mL MULTIHANCE GADOBENATE DIMEGLUMINE 529 MG/ML IV SOLN  COMPARISON:  CT head 05/05/2013.  FINDINGS: MRI HEAD FINDINGS  No evidence for acute infarction, hemorrhage, mass lesion, hydrocephalus, or extra-axial fluid. There is no atrophy or white matter disease. Flow voids are maintained. No focus of chronic hemorrhage. Normal-appearing calvarium, skull base, and upper cervical region. Pituitary and cerebellar tonsils unremarkable. Post infusion, no abnormal enhancement of the brain or meninges. Major dural venous sinuses patent. Negative orbits, sinuses, and mastoids.  MRA HEAD FINDINGS  The internal carotid arteries are widely patent. The basilar artery is widely patent with both vertebrals contributing to its formation. There is no intracranial stenosis observed.  Two aneurysmal outpouchings are seen from the left internal carotid artery. In the inferior cavernous segment a wide neck aneurysm measures 3 mm and points inferiorly. At the origin of the left posterior communicating artery, a 2 mm outpouching is present which could represent an  infundibulum but aneurysm is not excluded.  MRA NECK FINDINGS  Conventional branching great vessels from the arch. No proximal stenosis. Transverse arch unremarkable.  Internal carotid artery bifurcations widely patent. No evidence for cervical ICA fibromuscular dysplasia or dissection.  Both vertebral arteries widely patent through the neck. Tortuosity of the proximal vertebral segments results in some signal dropout but there is no ostial stenosis.  IMPRESSION: MRI HEAD IMPRESSION  No acute stroke or hemorrhage. No abnormal postcontrast enhancement. No intracranial abnormality is detected to explain the patient's symptoms.  MRA HEAD IMPRESSION  No intracranial stenosis or dissection. 3 mm inferior cavernous LICA aneurysm. 2 mm left PCom ICA aneurysm versus infundibulum.  MRA NECK IMPRESSION  Unremarkable MRA extracranial circulation.   Electronically Signed   By: Davonna Belling M.D.   On: 05/05/2013 15:51   Mr Laqueta Jean ZO Contrast  05/05/2013   CLINICAL DATA:  Right-sided weakness. Gait abnormality. Stroke risk factors include hypertension.  EXAM: MRI HEAD WITHOUT AND WITH CONTRAST  MRA HEAD WITHOUT CONTRAST  MRA NECK WITHOUT AND WITH CONTRAST  TECHNIQUE: Multiplanar, multiecho pulse sequences of the brain and surrounding structures were obtained without and with intravenous contrast. Angiographic images of the Circle of Willis were obtained using MRA technique without intravenous contrast. Angiographic images of the neck were obtained using MRA technique without and with intravenous contrast. Carotid stenosis measurements (when applicable) are obtained utilizing NASCET criteria, using the distal internal carotid diameter as the denominator.  CONTRAST:  20mL MULTIHANCE GADOBENATE DIMEGLUMINE 529 MG/ML IV SOLN  COMPARISON:  CT head 05/05/2013.  FINDINGS: MRI HEAD FINDINGS  No evidence for acute infarction, hemorrhage, mass lesion, hydrocephalus, or extra-axial fluid. There is no atrophy or white matter disease.  Flow voids are maintained. No focus of chronic hemorrhage. Normal-appearing calvarium, skull base, and upper cervical region. Pituitary and cerebellar tonsils unremarkable. Post infusion, no abnormal enhancement of the brain or meninges. Major dural venous sinuses patent. Negative orbits, sinuses, and mastoids.  MRA HEAD FINDINGS  The internal carotid arteries are widely patent. The basilar artery is widely patent with both vertebrals contributing to its formation. There is no intracranial stenosis observed.  Two aneurysmal outpouchings are seen from the left internal carotid artery. In the inferior  cavernous segment a wide neck aneurysm measures 3 mm and points inferiorly. At the origin of the left posterior communicating artery, a 2 mm outpouching is present which could represent an infundibulum but aneurysm is not excluded.  MRA NECK FINDINGS  Conventional branching great vessels from the arch. No proximal stenosis. Transverse arch unremarkable.  Internal carotid artery bifurcations widely patent. No evidence for cervical ICA fibromuscular dysplasia or dissection.  Both vertebral arteries widely patent through the neck. Tortuosity of the proximal vertebral segments results in some signal dropout but there is no ostial stenosis.  IMPRESSION: MRI HEAD IMPRESSION  No acute stroke or hemorrhage. No abnormal postcontrast enhancement. No intracranial abnormality is detected to explain the patient's symptoms.  MRA HEAD IMPRESSION  No intracranial stenosis or dissection. 3 mm inferior cavernous LICA aneurysm. 2 mm left PCom ICA aneurysm versus infundibulum.  MRA NECK IMPRESSION  Unremarkable MRA extracranial circulation.   Electronically Signed   By: Davonna Belling M.D.   On: 05/05/2013 15:51    MDM  No diagnosis found.  Patient arrived as a Code Stroke, but has rapidly resolving symtoms are consistent with TIA. Patient has had multiple TIAs in the past. At arrival to the ER examination reveals equal grip strength,  equal leg strength and she no longer has the sensation of numbness, tingling or weakness in the right side. Sent for immediate CT, but Code Stroke was canceled based on her rapidly improving symptoms.  Workup performed in conjunction with neurology. Patient has been seen for similar episodes in the past and is felt to be a complex migraine. MRI and MRA was performed to evaluate for vascular abnormalities and stroke. Patient does have left internal carotid artery aneurysm. Awaiting call back from neurology for further input. Case signed out to Doctor Rennis Chris to followup with neurology recommendations for treatment and disposition.  Gilda Crease, MD 05/08/13 1316

## 2013-05-05 NOTE — ED Notes (Signed)
Dr. Blinda Leatherwood in talking with pt

## 2013-05-05 NOTE — ED Notes (Signed)
Pt  Here from Md office with c/o right sided weakness. Pt awoke with a h/a took 2 Excedrin migraine . Pt then went to MD office where EMS was called after right side weakness and numbness , right side weakness is better upon arrival to the ED

## 2013-05-05 NOTE — ED Notes (Signed)
Patient transported to MRI 

## 2013-05-05 NOTE — Consult Note (Signed)
Neurology Consultation Reason for Consult: Right-sided weakness Referring Physician: Pollina, C  CC: Right-sided weakness  History is obtained from: Patient  HPI: Monique Daniels is a 52 y.o. female who presents with her fourth episode of transient right-sided weakness and numbness. She has had several these episodes over the past few years, associated with headaches. This morning, she awoke around 5 AM with severe headache and then later run a.m. noticed right-sided weakness and numbness. She also had tingling throughout her right side. Progressively got worse and therefore she sought treatment in emergency room. It has been gradually improving since that time. Though some symptoms are still present at the time of my initial exam.     ROS: A 14 point ROS was performed and is negative except as noted in the HPI.  Past Medical History  Diagnosis Date  . Degeneration of cervical intervertebral disc   . Calculus of kidney   . Migraine, unspecified, without mention of intractable migraine without mention of status migrainosus   . Other dyspnea and respiratory abnormality   . Unspecified vitamin D deficiency   . Migraine with aura, without mention of intractable migraine without mention of status migrainosus   . Enthesopathy of hip region   . Other malaise and fatigue   . Allergic rhinitis, cause unspecified   . Irregular menstrual cycle   . Esophageal reflux   . Benign neoplasm of skin, site unspecified   . Carpal tunnel syndrome   . Symptomatic menopausal or female climacteric states   . Obesity, unspecified   . Transient ischemic attack (TIA), and cerebral infarction without residual deficits(V12.54)   . Palpitations   . Essential hypertension, malignant   . Dizziness and giddiness   . Ovarian cyst     Family History: Migraines  Social History: Tob: None  Exam: Current vital signs: BP 129/73  Pulse 76  Temp(Src) 97.6 F (36.4 C) (Oral)  Resp 17  SpO2 96%  LMP  05/04/2013 Vital signs in last 24 hours: Temp:  [97.6 F (36.4 C)-98 F (36.7 C)] 97.6 F (36.4 C) (10/03 1710) Pulse Rate:  [61-76] 76 (10/03 1400) Resp:  [17-20] 17 (10/03 1710) BP: (129-166)/(73-85) 129/73 mmHg (10/03 1710) SpO2:  [96 %-100 %] 96 % (10/03 1710)  General: In bed, NAD CV: Regular rate and rhythm Mental Status: Patient is awake, alert, oriented to person, place, month, year, and situation. Immediate and remote memory are intact. Patient is able to give a clear and coherent history. No signs of aphasia or neglect Cranial Nerves: II: Visual Fields are full. Pupils are equal, round, and reactive to light.  Discs are difficult to visualize. III,IV, VI: EOMI without ptosis or diploplia.  V: Facial sensation is decreased on right to pin VII: Facial movement is symmetric.  VIII: hearing is intact to voice X: Uvula elevates symmetrically XI: Shoulder shrug is symmetric. XII: tongue is midline without atrophy or fasciculations.  Motor: Tone is normal. Bulk is normal. 5/5 strength was present thorughout.  Sensory: Sensation is decreased on the right to pin.  Deep Tendon Reflexes: 2+ and symmetric in the biceps and patellae.  Plantars: Toes are downgoing bilaterally.  Cerebellar: FNF and HKS are intact bilaterally Gait: Not assessed due to acute nature of evaluation and multiple medical monitors in ED setting.   I have reviewed labs in epic and the results pertinent to this consultation are: Normal creatinine  I have reviewed the images obtained: MRI brain, MRA head and neck. She does not have any  severe stenosis that may be concerned for recurrent hypoperfusion TIAs, and a normal MRI of her brain  Impression: 52 year old female with a history of recurrent episodes of right-sided numbness and weakness in the setting of headache. The presence of positive symptoms (tingling) and progression of symptoms coupled with the stereotypy of the episodes make me feel that  competent migraine is much more likely than TIA. Because of stereotyped episodes, an MRA was done to rule out a severe stenosis that could be contributing to recurrent similar presentations and this was not seen.  She does have some small aneurysms which could be followed up with repeat imaging.  Recommendations: 1) favor continuing ASA 2) repeat imaging in 6 months for stability of aneurysms. 3) no further neurodiagnostic testing at this time.   Ritta Slot, MD [facility] [phone]  If 7pm- 7am, please page neurology on call at [phone].

## 2013-05-05 NOTE — ED Notes (Signed)
Pt returned from MRI.  Pt alert and oriented x's 3, skin warm and dry color appropriate.  Family at bedside.

## 2013-05-05 NOTE — ED Notes (Signed)
Pt denies headache, no complaints voiced at this time.

## 2013-05-05 NOTE — ED Provider Notes (Signed)
At 16 10 PM patient complained of right-sided headache. She is alert awake Glasgow comma score 15. No facial asymmetry gait normal Romberg normal pronator drift normal. I discussed her case and MRI scan with Dr.Kirk Luisa Hart states that the patient followup with [facility] as outpatient. Patient is aware of cerebral aneurysms. Not felt to be triggering factor of today's symptoms. Dr. Amada Jupiter feels that she is suffering from complex migraine headaches. She should have a repeat scan to 6 months to check progressive aneurysms which can be arranged ago for [facility] by her primary care physician. Her headache was treated with Compazine and Benadryl, ordered by Dr.Pollina at 5:30 PM she feels much improved ready to go home.  Doug Sou, MD 05/05/13 1730

## 2013-05-05 NOTE — ED Notes (Signed)
Pt remains alert and orientec

## 2013-05-05 NOTE — Code Documentation (Signed)
52yo female arriving to [facility] at 1218 via [facility].  Patient woke up this morning around 5:30am with a headache and nausea.  She took her BP medication and Exedrin for the headache.  At 0800 patient developed right sided weakness that got progressively worse over the morning.  Patient also reports tingling in the right arm.  Patient went to work before going to the doctor where EMS was called.  En route to the ED, EMS reports that symptoms cleared.  EDP exam at 1220, stroke team arrived at 1217, neurologist arrived at 1235, patient arrived to CT at 1221, phlebotomist arrival at 1230.  NIHSS 1 on arrival for decreased sensation on the right side.  Patient has a h/o HTN, TIAs, and migraines.  Patient reports that this presentation is not typical of her migraines.  Neurologist to bedside to assess, will order MRI.  Patient agreeable with plan of care.  No acute stroke intervention at this time.  Bedside handoff completed with ED RN Lequita Halt.

## 2013-05-12 ENCOUNTER — Ambulatory Visit (INDEPENDENT_AMBULATORY_CARE_PROVIDER_SITE_OTHER): Payer: BC Managed Care – PPO | Admitting: Neurology

## 2013-05-12 ENCOUNTER — Encounter: Payer: Self-pay | Admitting: Neurology

## 2013-05-12 VITALS — BP 135/83 | HR 66 | Ht 62.0 in | Wt 215.0 lb

## 2013-05-12 DIAGNOSIS — G43409 Hemiplegic migraine, not intractable, without status migrainosus: Secondary | ICD-10-CM

## 2013-05-12 DIAGNOSIS — I671 Cerebral aneurysm, nonruptured: Secondary | ICD-10-CM | POA: Insufficient documentation

## 2013-05-12 MED ORDER — DICLOFENAC POTASSIUM(MIGRAINE) 50 MG PO PACK: 50.0000 mg | PACK | Freq: Once | ORAL | Status: DC | PRN

## 2013-05-12 MED ORDER — NORTRIPTYLINE HCL 10 MG PO CAPS: 10.0000 mg | ORAL_CAPSULE | Freq: Every day | ORAL | Status: DC

## 2013-05-12 NOTE — Patient Instructions (Signed)
Overall you are doing fairly well but I do want to suggest a few things today:   Remember to drink plenty of fluid, eat healthy meals and do not skip any meals. Try to eat protein with a every meal and eat a healthy snack such as fruit or nuts in between meals. Try to keep a regular sleep-wake schedule and try to exercise daily, particularly in the form of walking, 20-30 minutes a day, if you can.   As far as your medications are concerned, I would like to suggest starting a medication called Pamelor. Please take 10mg  nightly. Discontinue the Elavil.   We will also start a medication called Cambia. They are 50mg  packets. Please take one packet as needed for breakthrough headaches. Please limit it to 2 to 3 times per week.   As far as diagnostic testing: We will plan to repeat the MRA in 6 months  I would like to see you back in 6 months, sooner if we need to. Please call us with any interim questions, concerns, problems, updates or refill requests.   Please also call us for any test results so we can go over those with you on the phone.  My clinical assistant and will answer any of your questions and relay your messages to me and also relay most of my messages to you.   Our phone number is [phone]. We also have an after hours call service for urgent matters and there is a physician on-call for urgent questions. For any emergencies you know to call 911 or go to the nearest emergency room

## 2013-05-12 NOTE — Progress Notes (Signed)
[facility]    Provider:  Dr Hosie Poisson Referring Provider: Alba Cory, MD Primary Care Physician:  Tommy Rainwater, MD  CC:  Headache, dizziness, transient R sided weakness  HPI:  Monique Daniels is a 52 y.o. female here as a referral from Dr. Carlynn Purl for headache evaluation  Started 2 years ago,per patient diagnosed with "basilar" migraine, no vertigo, gets weakness on her R side, slurred speech. These episodes are occuring more frequently in the past few weeks. Last episode around 1 week ago. Has been trying tramadol and excedrin without much benefit. Stopped Elavil due to inability to tolerate as she was having severe fatigue. Typically having one every other month but this month having had 3 to 4 already. No new changes in life this month. +N/V, +photo and phonophobia. HA can last hours to all day. No visual changes with headache.   Patient recently evaluated in the ED for these symptoms. Had MRI/A. Imaging was reviewed, unremarkable except for 3 mm inferior cavernous LICA aneurysm and 2 mm left PCom ICA aneurysm versus infundibulum  Sister has migraines.  Patient has history of kidney stones.  Neuro Consult from ED Visit 10/03: Monique Daniels is a 52 y.o. female who presents with her fourth episode of transient right-sided weakness and numbness. She has had several these episodes over the past few years, associated with headaches. This morning, she awoke around 5 AM with severe headache and then later run a.m. noticed right-sided weakness and numbness. She also had tingling throughout her right side. Progressively got worse and therefore she sought treatment in emergency room. It has been gradually improving since that time. Though some symptoms are still present at the time of my initial exam.  Reviewed notes, labs and imaging from outside physicians, which showed positive for weight gain fatigue headache snoring Review of Systems: Out of a complete 14  system review, the patient complains of only the following symptoms, and all other reviewed systems are negative. Positive for fatigue weight gain headache  History   Social History  . Marital Status: Married    Spouse Name: N/A    Number of Children: N/A  . Years of Education: N/A   Occupational History  . Not on file.   Social History Main Topics  . Smoking status: Never Smoker   . Smokeless tobacco: Not on file  . Alcohol Use: No  . Drug Use: No  . Sexual Activity:    Other Topics Concern  . Not on file   Social History Narrative  . No narrative on file    Family History  Problem Relation Age of Onset  . Asthma Mother   . Heart disease Mother   . Hyperlipidemia Mother   . Asthma Son   . Heart attack Father     Past Medical History  Diagnosis Date  . Degeneration of cervical intervertebral disc   . Calculus of kidney   . Migraine, unspecified, without mention of intractable migraine without mention of status migrainosus   . Other dyspnea and respiratory abnormality   . Unspecified vitamin D deficiency   . Migraine with aura, without mention of intractable migraine without mention of status migrainosus   . Enthesopathy of hip region   . Other malaise and fatigue   . Allergic rhinitis, cause unspecified   . Irregular menstrual cycle   . Esophageal reflux   . Benign neoplasm of skin, site unspecified   . Carpal tunnel syndrome   . Symptomatic menopausal or female  climacteric states   . Obesity, unspecified   . Transient ischemic attack (TIA), and cerebral infarction without residual deficits(V12.54)   . Palpitations   . Essential hypertension, malignant   . Dizziness and giddiness   . Ovarian cyst     Past Surgical History  Procedure Laterality Date  . Tubal ligation    . Tooth extraction      Current Outpatient Prescriptions  Medication Sig Dispense Refill  . amLODipine (NORVASC) 2.5 MG tablet Take 2.5 mg by mouth daily.      Marland Kitchen aspirin 81 MG  tablet Take 81 mg by mouth as needed (TIA).       Marland Kitchen aspirin-acetaminophen-caffeine (EXCEDRIN MIGRAINE) 250-250-65 MG per tablet Take 2 tablets by mouth every 6 (six) hours as needed for pain.      . Cholecalciferol (VITAMIN D) 2000 UNITS tablet Take 2,000 Units by mouth as needed (energy).       . cyclobenzaprine (FLEXERIL) 10 MG tablet Take 10 mg by mouth daily as needed for muscle spasms.       . hydrochlorothiazide (HYDRODIURIL) 12.5 MG tablet Take 12.5 mg by mouth daily.      Marland Kitchen ibuprofen (ADVIL,MOTRIN) 200 MG tablet Take 400 mg by mouth every 6 (six) hours as needed for pain or headache.       . nortriptyline (PAMELOR) 10 MG capsule Take 10 mg by mouth at bedtime.      Marland Kitchen omeprazole (PRILOSEC) 20 MG capsule Take 20 mg by mouth daily.      . sodium chloride (OCEAN) 0.65 % nasal spray Place 1 spray into both nostrils daily as needed for congestion (allergies).       Marland Kitchen amitriptyline (ELAVIL) 50 MG tablet Take 25 mg by mouth at bedtime.       No current facility-administered medications for this visit.    Allergies as of 05/12/2013 - Review Complete 05/12/2013  Allergen Reaction Noted  . Codeine Other (See Comments) 07/14/2012    Vitals: BP 135/83  Pulse 66  Ht 5\' 2"  (1.575 m)  Wt 215 lb (97.523 kg)  BMI 39.31 kg/m2  LMP 05/04/2013 Last Weight:  Wt Readings from Last 1 Encounters:  05/12/13 215 lb (97.523 kg)   Last Height:   Ht Readings from Last 1 Encounters:  05/12/13 5\' 2"  (1.575 m)     Physical exam: Exam: Gen: NAD, conversant Eyes: anicteric sclerae, moist conjunctivae HENT: Atraumatic, oropharynx clear Neck: Trachea midline; supple,  Lungs: CTA, no wheezing, rales, rhonic                          CV: RRR, no MRG Abdomen: Soft, non-tender;  Extremities: No peripheral edema  Skin: Normal temperature, no rash,  Psych: Appropriate affect, pleasant  Neuro: MS: AA&Ox3, appropriately interactive, normal affect   Speech: fluent w/o paraphasic error  Memory: good  recent and remote recall  CN: PERRL, EOMI no nystagmus, no ptosis, sensation intact to LT V1-V3 bilat, face symmetric, no weakness, hearing grossly intact, palate elevates symmetrically, shoulder shrug 5/5 bilat,  tongue protrudes midline, no fasiculations noted.  Motor: normal bulk and tone Strength: 5/5  In all extremities  Coord: rapid alternating and point-to-point (FNF, HTS) movements intact.  Reflexes: symmetrical, bilat downgoing toes  Sens: LT intact in all extremities  Gait: posture, stance, stride and arm-swing normal. Tandem gait intact. Able to walk on heels and toes. Romberg absent.   Assessment:  After physical and neurologic examination, review of laboratory  studies, imaging, neurophysiology testing and pre-existing records, assessment will be reviewed on the problem list.  Plan:  Treatment plan and additional workup will be reviewed under Problem List.  1)Hemiplegic migraine 2)Cerebral aneurysm  History and symptoms are consistent with a hemiplegic migraine. Based on history, otherwise normal physical exam and MRI less likely to represent recurrent TIA or other processes such as CADASIL or Call-Fleming syndrome. Patient unable to tolerate Elavil, not a candidate for Topamax. Will switch to Pamelor 10mg  nightly. Can titrate up in the future as needed. Will try cambia prn for breakthrough headaches. If not tolerating or working would consider anti-emetic. Will repeat MRA in 6 months to check for aneurysm stability.

## 2013-05-24 ENCOUNTER — Telehealth: Payer: Self-pay | Admitting: *Deleted

## 2013-05-25 NOTE — Telephone Encounter (Signed)
Called for more clarifications on questions for doctor, lt VM message for return call back

## 2013-05-25 NOTE — Telephone Encounter (Signed)
Returned patient's call, questions answered.

## 2013-05-26 ENCOUNTER — Telehealth: Payer: Self-pay | Admitting: *Deleted

## 2013-05-26 NOTE — Telephone Encounter (Signed)
I returned call to pt.  She has not had any improvement with her medication, pamelor. She has not been able to get cambia, due to needing PA.  I set her samples up front for this.  She asked about location of aneurysms.  She is not mychart,  I faxed report to her work per her rerquest [id number], [phone].   I would relay to Dr. Hosie Poisson about her migraines, and having headaches, top back of head.

## 2013-07-02 ENCOUNTER — Telehealth: Payer: Self-pay

## 2013-07-02 NOTE — Telephone Encounter (Signed)
[employer] sent [place] a letter saying they have approved our request for coverage on Cambia effective until 06/12/2014 Case # [id number]

## 2013-08-16 ENCOUNTER — Ambulatory Visit: Payer: Self-pay | Admitting: Family Medicine

## 2013-09-25 ENCOUNTER — Other Ambulatory Visit: Payer: Self-pay | Admitting: Neurosurgery

## 2013-09-25 ENCOUNTER — Other Ambulatory Visit (HOSPITAL_COMMUNITY): Payer: Self-pay | Admitting: Interventional Radiology

## 2013-09-25 ENCOUNTER — Other Ambulatory Visit (HOSPITAL_COMMUNITY): Payer: Self-pay | Admitting: Neurosurgery

## 2013-09-25 DIAGNOSIS — I729 Aneurysm of unspecified site: Secondary | ICD-10-CM

## 2013-09-29 ENCOUNTER — Encounter (HOSPITAL_COMMUNITY): Payer: Self-pay | Admitting: Pharmacy Technician

## 2013-10-05 ENCOUNTER — Other Ambulatory Visit (HOSPITAL_COMMUNITY): Payer: Self-pay | Admitting: Neurosurgery

## 2013-10-05 ENCOUNTER — Ambulatory Visit (HOSPITAL_COMMUNITY)
Admission: RE | Admit: 2013-10-05 | Discharge: 2013-10-05 | Disposition: A | Discharge status: Home / Self Care | Payer: BC Managed Care – PPO | Source: Ambulatory Visit | Attending: Neurosurgery | Admitting: Neurosurgery

## 2013-10-05 DIAGNOSIS — I729 Aneurysm of unspecified site: Secondary | ICD-10-CM

## 2013-10-05 DIAGNOSIS — R209 Unspecified disturbances of skin sensation: Secondary | ICD-10-CM | POA: Insufficient documentation

## 2013-10-05 DIAGNOSIS — G43909 Migraine, unspecified, not intractable, without status migrainosus: Secondary | ICD-10-CM | POA: Insufficient documentation

## 2013-10-05 DIAGNOSIS — I10 Essential (primary) hypertension: Secondary | ICD-10-CM | POA: Insufficient documentation

## 2013-10-05 DIAGNOSIS — K219 Gastro-esophageal reflux disease without esophagitis: Secondary | ICD-10-CM | POA: Insufficient documentation

## 2013-10-05 DIAGNOSIS — R29898 Other symptoms and signs involving the musculoskeletal system: Secondary | ICD-10-CM | POA: Insufficient documentation

## 2013-10-05 DIAGNOSIS — Z8673 Personal history of transient ischemic attack (TIA), and cerebral infarction without residual deficits: Secondary | ICD-10-CM | POA: Insufficient documentation

## 2013-10-05 DIAGNOSIS — R4789 Other speech disturbances: Secondary | ICD-10-CM | POA: Insufficient documentation

## 2013-10-05 LAB — URINALYSIS, ROUTINE W REFLEX MICROSCOPIC
BILIRUBIN URINE: NEGATIVE
GLUCOSE, UA: NEGATIVE mg/dL
HGB URINE DIPSTICK: NEGATIVE
KETONES UR: NEGATIVE mg/dL
Leukocytes, UA: NEGATIVE
Nitrite: NEGATIVE
Protein, ur: NEGATIVE mg/dL
Specific Gravity, Urine: 1.015 (ref 1.005–1.030)
UROBILINOGEN UA: 0.2 mg/dL (ref 0.0–1.0)
pH: 6 (ref 5.0–8.0)

## 2013-10-05 LAB — BASIC METABOLIC PANEL
BUN: 9 mg/dL (ref 6–23)
CO2: 24 meq/L (ref 19–32)
CREATININE: 0.62 mg/dL (ref 0.50–1.10)
Calcium: 9 mg/dL (ref 8.4–10.5)
Chloride: 105 mEq/L (ref 96–112)
GFR calc non Af Amer: >90 mL/min (ref >90)
Glucose, Bld: 85 mg/dL (ref 70–99)
Potassium: 3.8 mEq/L (ref 3.7–5.3)
Sodium: 141 mEq/L (ref 137–147)

## 2013-10-05 LAB — CBC WITH DIFFERENTIAL/PLATELET
BASOS PCT: 1 % (ref 0–1)
Basophils Absolute: 0.1 10*3/uL (ref 0.0–0.1)
EOS PCT: 4 % (ref 0–5)
Eosinophils Absolute: 0.3 10*3/uL (ref 0.0–0.7)
HEMATOCRIT: 38.6 % (ref 36.0–46.0)
Hemoglobin: 13.1 g/dL (ref 12.0–15.0)
Lymphocytes Relative: 26 % (ref 12–46)
Lymphs Abs: 2 10*3/uL (ref 0.7–4.0)
MCH: 28.5 pg (ref 26.0–34.0)
MCHC: 33.9 g/dL (ref 30.0–36.0)
MCV: 83.9 fL (ref 78.0–100.0)
MONO ABS: 0.8 10*3/uL (ref 0.1–1.0)
Monocytes Relative: 10 % (ref 3–12)
Neutro Abs: 4.5 10*3/uL (ref 1.7–7.7)
Neutrophils Relative %: 59 % (ref 43–77)
PLATELETS: 318 10*3/uL (ref 150–400)
RBC: 4.6 MIL/uL (ref 3.87–5.11)
RDW: 13.5 % (ref 11.5–15.5)
WBC: 7.6 10*3/uL (ref 4.0–10.5)

## 2013-10-05 LAB — APTT: APTT: 32 s (ref 24–37)

## 2013-10-05 LAB — PROTIME-INR
INR: 0.96 (ref 0.00–1.49)
Prothrombin Time: 12.6 seconds (ref 11.6–15.2)

## 2013-10-05 MED ORDER — FENTANYL CITRATE 0.05 MG/ML IJ SOLN
INTRAMUSCULAR | Status: AC | PRN
  Administered 2013-10-05: 25 ug via INTRAVENOUS

## 2013-10-05 MED ORDER — MIDAZOLAM HCL 2 MG/2ML IJ SOLN
INTRAMUSCULAR | Status: AC
  Filled 2013-10-05: qty 2

## 2013-10-05 MED ORDER — IOHEXOL 300 MG/ML  SOLN
150.0000 mL | Freq: Once | INTRAMUSCULAR | Status: AC | PRN
  Administered 2013-10-05: 40 mL via INTRA_ARTERIAL

## 2013-10-05 MED ORDER — MIDAZOLAM HCL 2 MG/2ML IJ SOLN
INTRAMUSCULAR | Status: AC | PRN
  Administered 2013-10-05: 0.5 mg via INTRAVENOUS

## 2013-10-05 MED ORDER — HEPARIN SODIUM (PORCINE) 1000 UNIT/ML IJ SOLN
INTRAMUSCULAR | Status: AC | PRN
  Administered 2013-10-05: 2000 [IU] via INTRAVENOUS

## 2013-10-05 MED ORDER — FENTANYL CITRATE 0.05 MG/ML IJ SOLN
INTRAMUSCULAR | Status: DC
Start: 2013-10-05 — End: 2013-10-06
  Filled 2013-10-05: qty 2

## 2013-10-05 NOTE — H&P (Signed)
HISTORY OF PRESENT ILLNESS: 1.  aneurysm   Monique Daniels is a 53 year old woman seen for the first time in consultation in the office.  Her history begins a few years ago having experienced for episodes over this period of time during which she describes numbness on the right side of her mouth with slurred speech as well as some weakness of the right arm and right leg.  These episodes last for a proximally one hour, however afterward she does state that she has difficulty remembering and some vague cognitive complaints for several hours.  She also describes a separate episode which occurred probably 6 months ago during which she had "no control" of her neck for a few seconds, after which she felt nauseated and weak for a few hours.  She has been previously diagnosed with migraine headaches during which she gets photophobia as well as phonophobia.  She does not believe the above episodes are related to her migraines.  Because of these episodes, she has undergone workup including CT scan as well as MRI and MRA of the brain.  MRA demonstrated possible left-sided internal carotid artery aneurysms and she was therefore referred for further neurosurgical consultation.    PAST MEDICAL/SURGICAL HISTORY  (Detailed)  Disease/disorder Onset Date Management Date Comments    Kidney stone removal  CRR 09/18/2013 -    Tubal ligation  CRR 09/18/2013 -  GERD    CRR 09/18/2013 -  Hypertension      TIA    CRR 09/18/2013 -     PAST MEDICAL HISTORY, SURGICAL HISTORY, FAMILY HISTORY, SOCIAL HISTORY AND REVIEW OF SYSTEMS I have reviewed the patient's past medical, surgical, family and social history as well as the comprehensive review of systems as included on the [facility] history form dated 09/18/2013, which I have signed.  Family History  (Detailed)  Relationship Family Member Name Deceased Age at Death Condition Onset Age Cause of Death      Family history of Hypertension  N    SOCIAL HISTORY  (Detailed) Tobacco use reviewed. Preferred language is Unknown.   Smoking status: Never smoker.  SMOKING STATUS Use Status Type Smoking Status Usage Per Day Years Used Total Pack Years  no/never  Never smoker       HOME ENVIRONMENT/SAFETY The patient has not fallen in the last year.        MEDICATIONS(added, continued or stopped this visit):   Medication Dose Prescribed Else Ind Started Stopped  amlodipine 2.5 mg tablet 2.5 mg Y    Cambia 50 mg oral powder packet 50 mg Y    chorthalidone  BUCCAL  Y    escitalopram 10 mg tablet 10 mg Y    nortriptyline 10 mg capsule 10 mg Y    omeprazole 20 mg capsule,delayed release 20 mg Y     ALLERGIES:  Ingredient Reaction Medication Name Comment  CODEINE Nausea/Vomiting    TRAMADOL Sick    New allergies added during this encounter. Active list above. REVIEW OF SYSTEMS: Positive Items System Details  Constitutional Night sweats.  ENMT Wear glasses.  Respiratory Dyspnea.  Cardio Edema, HBP.  GI Nausea.  MS Neck pain, Arthritic manifestations.  GU Kidney stones.   Negative Items System Details  Constitutional Chills, fatigue, fever, malaise, weight gain and weight loss.  ENMT Ear drainage, hearing loss, nasal drainage, otalgia, sinus pressure and sore throat.  Eyes Eye discharge, eye pain and vision changes.  Respiratory Chronic cough, cough, known TB exposure and wheezing.  Cardio Chest pain, claudication and irregular heartbeat/palpitations.  GI Abdominal pain, blood in stool, change in stool pattern, constipation, decreased appetite, diarrhea, heartburn and vomiting.  Endocrine Cold intolerance, heat intolerance, polydipsia and polyphagia.  Neuro Dizziness, extremity weakness, gait disturbance, headache, memory impairment, numbness in extremities, seizures and tremors.  Psych Anxiety, depression and insomnia.  Integumentary Brittle hair, brittle nails, change in shape/size of mole(s), hair loss,  hirsutism, hives, pruritus, rash and skin lesion.  MS Back pain, joint pain, joint swelling and muscle weakness.  Hema/Lymph Easy bleeding, easy bruising and lymphadenopathy.  Allergic/Immuno Contact allergy, environmental allergies, food allergies and seasonal allergies.  Reproductive Breast discharge, breast lump(s), dysmenorrhea, dyspareunia, history of abnormal PAP smear and vaginal discharge.  GU Dysuria, hematuria, hot flashes, irregular menses, polyuria, urinary frequency, urinary incontinence and urinary retention.   Vitals Date Temp F BP Pulse Ht In Wt Lb BMI BSA Pain Score  09/18/2013  142/88 67 62 218.4 39.95  4/10     PHYSICAL EXAM General Level of Distress: no acute distress Overall Appearance: Normal    Neurological Orientation: normal Recent and Remote Memory: normal Attention Span and Concentration:   normal Language: Fluent, appropriate  Musculoskeletal Gait and Station: normal  Best boy  .Any abnormal findings will be noted below..   Right Left Deltoid: 5/5 5/5 Biceps: 5/5 5/5 Triceps: 5/5 5/5 Infraspinatus: 5/5 5/5 Wrist Extensor: 5/5 5/5 Grip: 5/5 5/5 Hip Flexor: 5/5 5/5 Knee Extensor: 5/5 5/5 Tib Anterior: 5/5 5/5 EHL: 5/5 5/5 Medial Gastroc: 5/5 5/5  Deep Tendon Reflexes  Right Left Biceps: normal normal Triceps: normal normal Brachiloradialis: normal normal Patellar: normal normal Achilles: normal normal  Sensory Sensation was tested at C2 to T1 and L1 to S1.  Cranial Nerves II. Optic Nerve/Visual Fields: bilateral normal III. Oculomotor: bilateral EOMI IV. Trochlear: bilateral EOMI V. Trigeminal: bilateral Facial sensation intact VI. Abducens: bilateral EOMI VII. Facial: bilateral normal VIII. Acoustic/Vestibular: bilateral Intact to finger rub IX. Glossopharyngeal: bilateral Palate elevates symetrically X. Vagus: bilateral Uvula midline XI. Spinal Accessory: bilateral Normal shoulder shrug XII. Hypoglossal: bilateral  Tongue midline  Motor and other Tests Rhomberg: positive Pronator drift: absent     Right Left Hoffman's: absent absent Clonus: absent absent Toe Walk: normal normal Heel Walk: normal normal    DIAGNOSTIC RESULTS MRA of the brain was reviewed demonstrating possible wide based horizontal cavernous left internal carotid artery aneurysm, although axial imaging is less conclusive for actual aneurysm.  There is also an outpouching of the distal supraclinoid left internal carotid artery which may represent small aneurysm versus infundibular origin of the posterior making artery.   IMPRESSION 53 year old woman with incidental discovery of possible left cavernous and supraclinoid internal carotid artery aneurysms, although the MRA is less than conclusive.  PLAN  - Diagnostic cerebral angiogram for definitive diagnosis and characterization of the aneurysm.  A comprehensive discussion was had with the patient and her son totaling approximately 30 minutes in the office. The natural history of cerebral aneurysms was reviewed, with a 1-2% per year risk of rupture. The general treatment options were also discussed, with the need for definitive diagnosis by catheter angiogram. The risks of the angiogram procedure were reviewed, including a 0.1% risk of stroke, and overal risk of approximately 1% including but not limited to groin hematoma, headache, contrast reaction, and nephropathy.  The patient and her son understood our discussion and are willing to proceed as above.  All their questions were answered.

## 2013-10-05 NOTE — Brief Op Note (Signed)
PREOP DX: Possible LICA aneurysms  POSTOP DX: L PCom Infundibulum  PROCEDURE: Diagnostic cerebral angiogram  SURGEON: Dr. Consuella Lose, MD  ANESTHESIA: IV Sedation with Local  EBL: Minimal  SPECIMENS: None  COMPLICATIONS: None  CONDITION: Stable to recovery  FINDINGS: 1. Infundibular origin of L Pcom, no cavernous LICA aneurysm seen 2. No other aneurysms/AVM/Fistulas seen 3. Good hemostasis with 5Fr ExoSeal closure

## 2013-10-05 NOTE — Discharge Instructions (Signed)
Angiography, Care After ° °Refer to this sheet in the next few weeks. These instructions provide you with information on caring for yourself after your procedure. Your health care provider may also give you more specific instructions. Your treatment has been planned according to current medical practices, but problems sometimes occur. Call your health care provider if you have any problems or questions after your procedure.  °WHAT TO EXPECT AFTER THE PROCEDURE °After your procedure, it is typical to have the following sensations: °· Minor discomfort or tenderness and a small bump at the catheter insertion site. The bump should usually decrease in size and tenderness within 1 to 2 weeks. °· Any bruising will usually fade within 2 to 4 weeks. °HOME CARE INSTRUCTIONS  °· You may need to keep taking blood thinners if they were prescribed for you. Only take over-the-counter or prescription medicines for pain, fever, or discomfort as directed by your health care provider. °· Do not apply powder or lotion to the site. °· Do not sit in a bathtub, swimming pool, or whirlpool for 5 to 7 days. °· You may shower 24 hours after the procedure. Remove the bandage (dressing) and gently wash the site with plain soap and water. Gently pat the site dry. °· Inspect the site at least twice daily. °· Limit your activity for the first 24 hours. Do not bend, squat, or lift anything over 10 lb (9 kg) or as directed by your health care provider. °· Do not drive home if you are discharged the day of the procedure. Have someone else drive you. Follow instructions about when you can drive or return to work. °SEEK MEDICAL CARE IF: °· You get lightheaded when standing up. °· You have drainage (other than a small amount of blood on the dressing). °· You have chills. °· You have a fever. °· You have redness, warmth, swelling, or pain at the insertion site. °SEEK IMMEDIATE MEDICAL CARE IF:  °· You develop chest pain or shortness of breath, feel  faint, or pass out. °· You have bleeding, swelling larger than a walnut, or drainage from the catheter insertion site. °· You develop pain, discoloration, coldness, or severe bruising in the leg or arm that held the catheter. °· You have heavy bleeding from the site. If this happens, hold pressure on the site. °MAKE SURE YOU: °· Understand these instructions. °· Will watch your condition. °· Will get help right away if you are not doing well or get worse. °Document Released: 02/05/2005 Document Revised: 03/22/2013 Document Reviewed: 12/12/2012 °[employer]® Patient Information ©2014 [employer]. ° °

## 2013-10-08 ENCOUNTER — Other Ambulatory Visit: Payer: Self-pay | Admitting: Neurology

## 2013-11-08 ENCOUNTER — Telehealth: Payer: Self-pay | Admitting: *Deleted

## 2013-11-08 NOTE — Telephone Encounter (Signed)
Called patient to r/s appointment, Dr Janann Colonel will not be available on Friday after 11 am, her spouse Merry Proud stated that he will have her call back to r/s

## 2013-11-09 ENCOUNTER — Telehealth: Payer: Self-pay | Admitting: *Deleted

## 2013-11-09 NOTE — Telephone Encounter (Signed)
Spoke with patient's husband Merry Proud, he stated that she was informed of her appointment being cancelled, patient will call back to r/s appointment.

## 2013-11-10 ENCOUNTER — Ambulatory Visit: Payer: BC Managed Care – PPO | Admitting: Neurology

## 2014-03-11 IMAGING — CR DG CHEST 2V
1 series · 2 of 2 positions shown · non-contrast
Comparison: none

REASON FOR EXAM: palpitations, chest discomfort
COMMENTS:

PROCEDURE:     DXR - DXR CHEST PA (OR AP) AND LATERAL  - June 16, 2012  [DATE]
RESULT:     Comparison: None

[Series 1: w chest pa · 0.14mm/px · 2 of 2 slices shown]
[im 1/2]
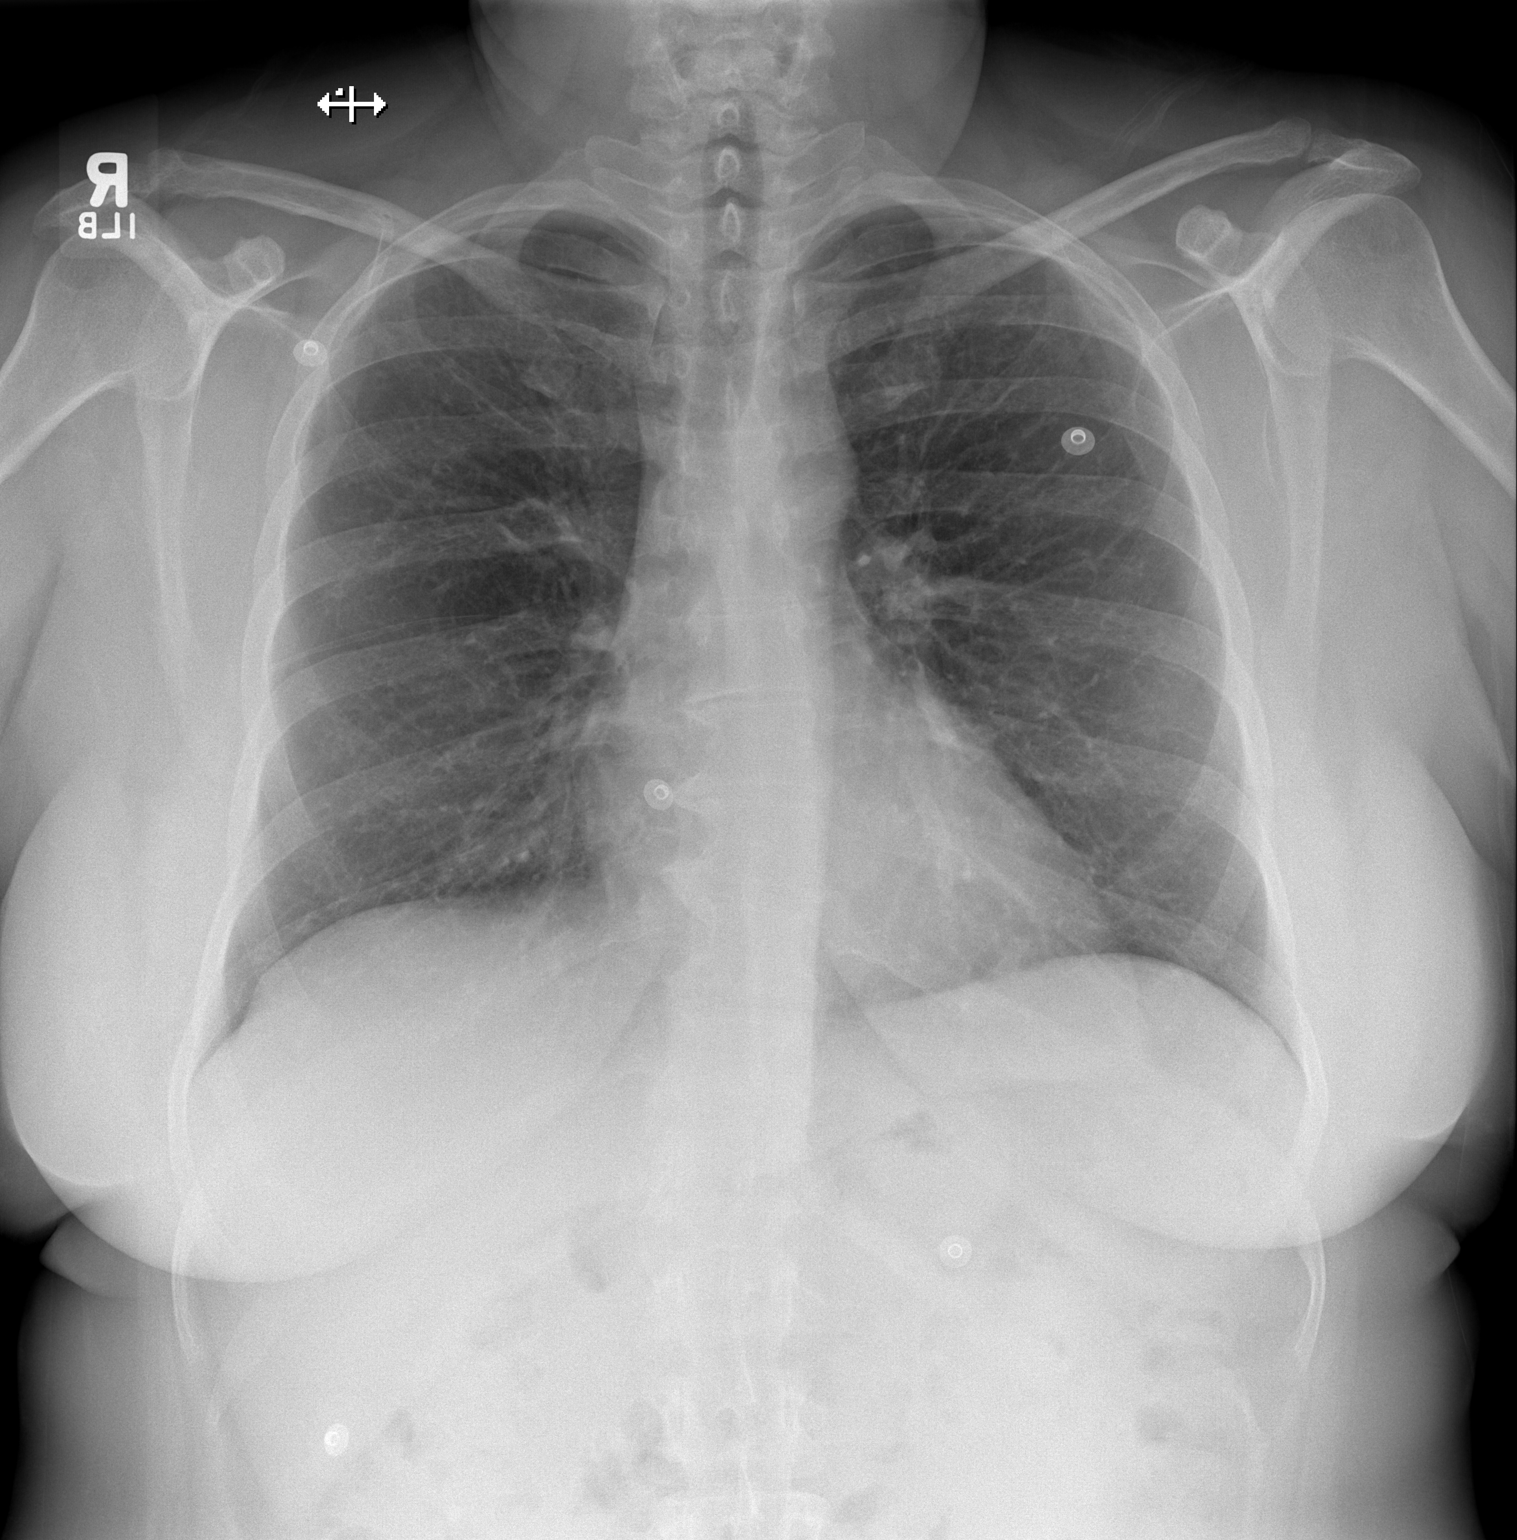
[im 2/2]
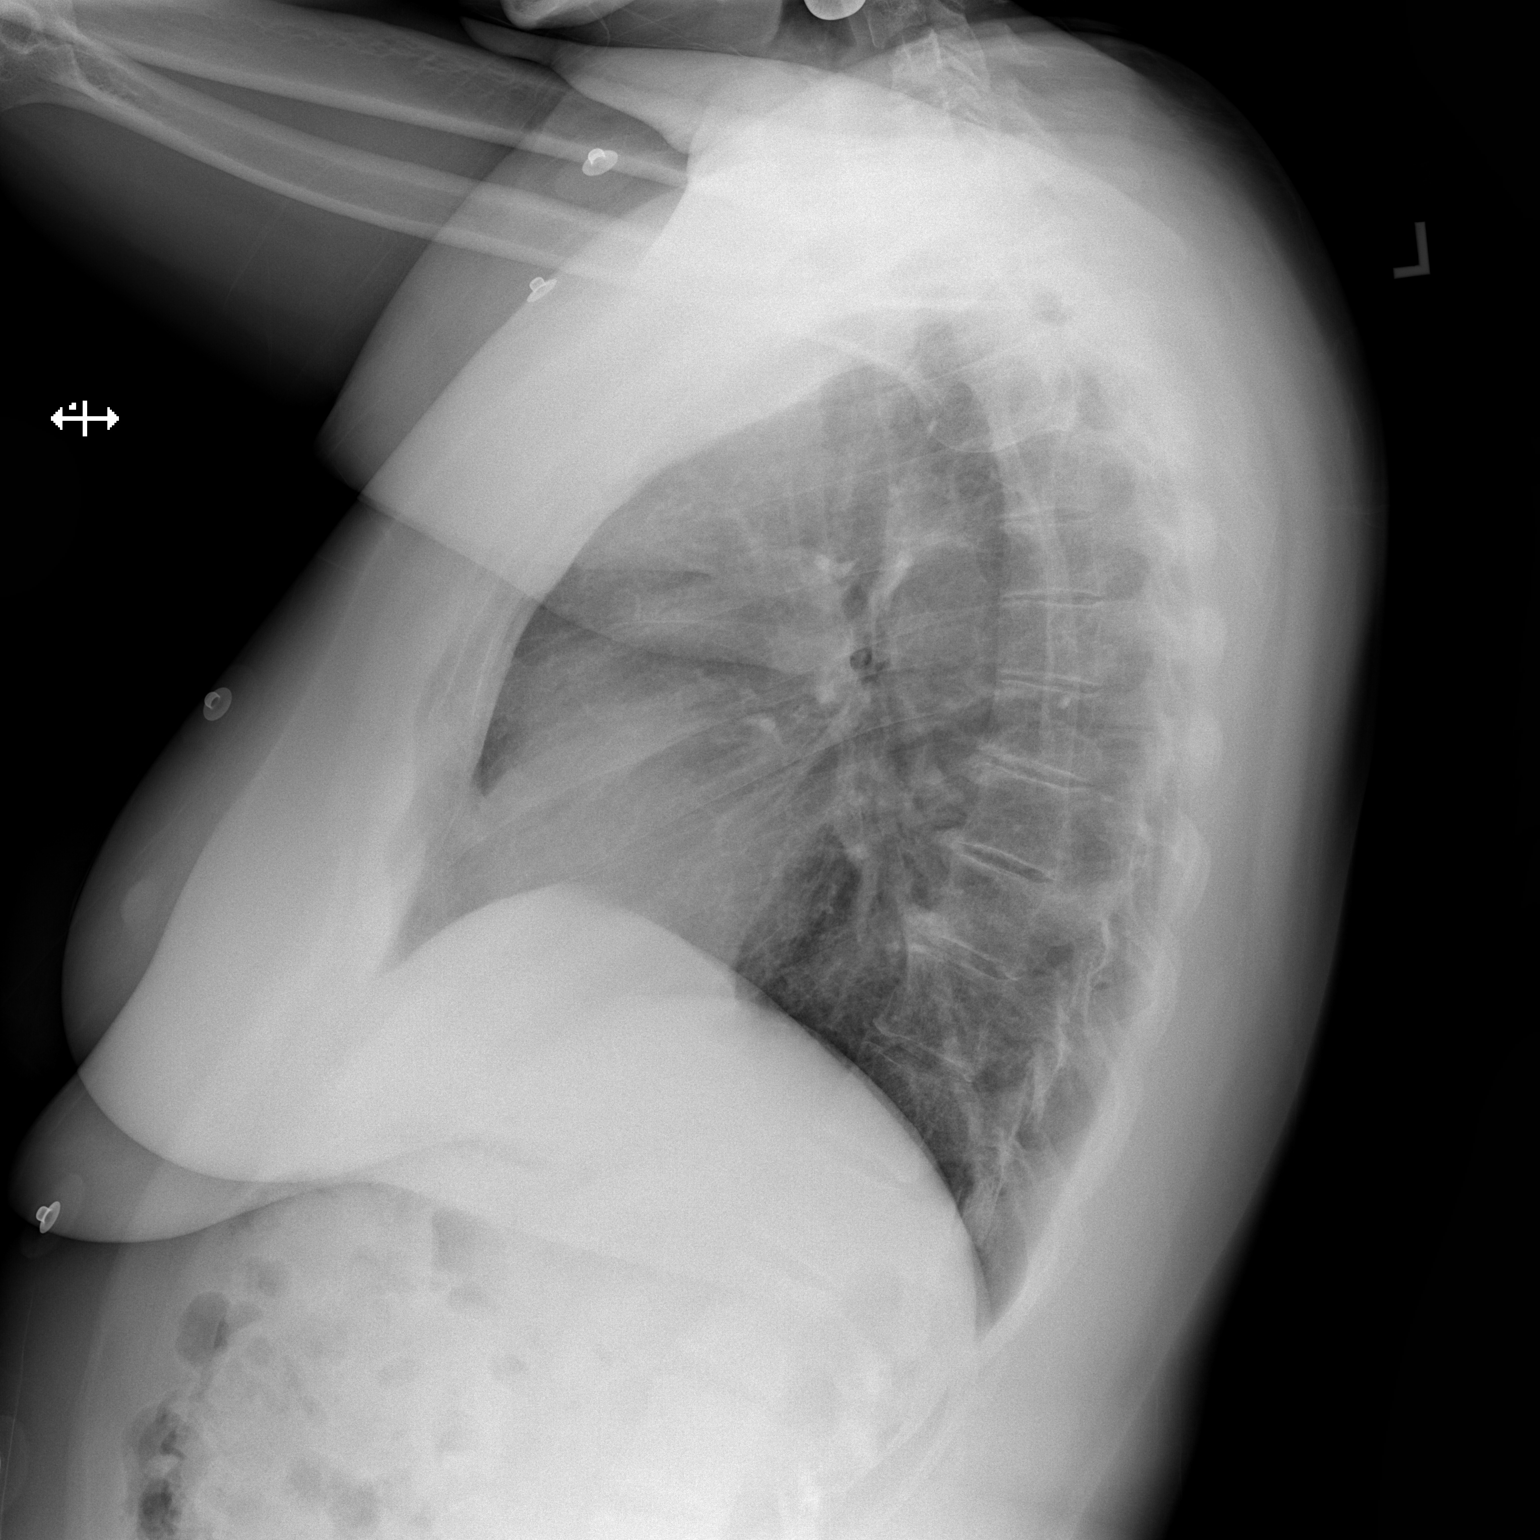

[2 of 2 positions shown; findings below may reference images not displayed]

FINDINGS: PA and lateral chest radiographs are provided.  There is no focal
parenchymal opacity, pleural effusion, or pneumothorax. The heart and
mediastinum are unremarkable.  The osseous structures are unremarkable.
IMPRESSION: No acute disease of the che[REDACTED]

## 2014-05-04 LAB — CBC AND DIFFERENTIAL
HCT: 39 % (ref 36–46)
HEMOGLOBIN: 13.3 g/dL (ref 12.0–16.0)
NEUTROS ABS: 4 /uL
Platelets: 305 10*3/uL (ref 150–399)
WBC: 7.1 10^3/mL

## 2014-05-04 LAB — LIPID PANEL
Cholesterol: 206 mg/dL — AB (ref 0–200)
HDL: 41 mg/dL (ref 35–70)
LDL Cholesterol: 140 mg/dL
LDL/HDL RATIO: 3.4
TRIGLYCERIDES: 125 mg/dL (ref 40–160)

## 2014-05-04 LAB — HEPATIC FUNCTION PANEL
ALK PHOS: 64 U/L (ref 25–125)
ALT: 23 U/L (ref 7–35)
AST: 22 U/L (ref 13–35)
Bilirubin, Total: 0.5 mg/dL

## 2014-05-04 LAB — BASIC METABOLIC PANEL
BUN: 10 mg/dL (ref 4–21)
CREATININE: 0.8 mg/dL (ref 0.5–1.1)
Glucose: 84 mg/dL
POTASSIUM: 3.4 mmol/L (ref 3.4–5.3)
Sodium: 140 mmol/L (ref 137–147)

## 2014-05-17 ENCOUNTER — Ambulatory Visit: Payer: BC Managed Care – PPO | Admitting: Cardiovascular Disease

## 2014-11-08 LAB — POCT ERYTHROCYTE SEDIMENTATION RATE, NON-AUTOMATED: Sed Rate: 10 mm

## 2014-11-20 NOTE — Consult Note (Signed)
PATIENT NAME:  Monique Daniels, Monique Daniels MR#:  [id number] DATE OF BIRTH:  09/13/1960  DATE OF CONSULTATION:  07/06/2012  REFERRING PHYSICIAN:  Dr. James Ivanoff   CONSULTING PHYSICIAN:  Aubryanna Nesheim K. Manuella Ghazi, MD  PRIMARY CARE PHYSICIAN:  Dr. Marylene Land  REASON FOR CONSULTATION:  Left-sided face with numbness and right-sided weakness.   HISTORY OF PRESENT ILLNESS: Monique Daniels is a 54 year old Caucasian female whom  I saw back in 2012 for concern of basilar migraine. The patient was started on amitriptyline and her symptoms got better, so she discontinued taking her amitriptyline. The patient had recurrence of her symptoms around mid November 2013. The patient had wisdom tooth extraction done in  August 2013 and had left lip numbness, which got better.  Around mid November 2013 the patient had an episode of numbness starting in her left lower lip going to the left upper lip with feeling of visual blurriness and increased blood pressure up to 210/110 with mild right-sided weakness, feeling of nausea and vomiting, and imbalance. She also had a spell of confusion at that time lasting for 2 to 3 days. She was evaluated in the ER at the time and she completely recovered.  The patient had a second spell around Wednesday, 06/29/2012. She had a very similar spell and had significantly elevated blood pressure. She was started on beta blockers and then developed bradycardia. The patient had a third spell on Sunday, 07/03/2012 and feels like her symptoms are very similar.   PAST MEDICAL HISTORY:  1. Hypertension. 2. History of previous basilar migraine.   PAST SURGICAL HISTORY: Bilateral tubal ligation.   SOCIAL HISTORY: The patient denies any use of alcohol, tobacco, or drug abuse. She is very active. She exercises regularly. She works here at [employer] in [place].   FAMILY HISTORY: Significant for coronary artery disease in her father.   ALLERGIES AND MEDICATIONS: I reviewed her allergies and home  medication list.    PHYSICAL EXAMINATION:  VITAL SIGNS: Temperature 97.8, pulse 50, respiratory rate 20, blood pressure 114/75, pulse oximetry 96%.   GENERAL: She is a middle-aged obese Caucasian female lying in bed surrounded by family members, wearing glasses.   LUNGS: Clear to auscultation.   HEART: S1, S2 heart sounds. Carotid exam did not reveal any bruit.   NECK: She does not have any neck pain or neck stiffness.   SKIN: Her skin does not show any rash.   MENTAL STATUS: She was alert. She knew that it is December 2013. She got the date wrong; she said the third instead of the fourth.  She knew the current President Obama. She said the previous president was [place]. She missed that the previous president was [employer]. She was able to tell me her address. She was able to follow two-step commands, but it took some time. Her husband thinks that is normal for her. She had some difficulty repeating a complex sentence.   CRANIAL NERVES:  Pupils were equal, round, and reactive. Her extraocular movements were intact but she felt it was a little bit fuzzy when she tried to track my finger.   Her visual fields seem full.  Her face was symmetric. Tongue was midline. Facial sensations were intact except she has decreased sensation to light touch in her left periorbital and left lateral nose.   Her hearing seems to be intact. Her uvula seems to be upgoing symmetrically.   She does not have any dysarthria.   On her motor exam she has normal tone and  strength of 5/5 in all extremities.   Her sensations were intact to light touch in her extremities.   Her coordination with finger-to-nose and heel-to-shin was fine.   Her Romberg was negative. Her gait was okay but slow.   LABORATORY, DIAGNOSTIC, AND RADIOLOGICAL DATA:  CT scan of the head was unremarkable.  Labs look okay.  ASSESSMENT AND PLAN:  Recurrent episode of feeling of left-sided periorbital numbness with right-sided weakness with  occasional headache with visual blurriness, feeling of lightheadedness or woozy sensation, with some autonomic changes of increased blood pressure. Might represent brainstem syndrome. First I would like to obtain MRI of the brain to make sure she does not have any brain stem ischemia. With her history of basilar migraine, this spell can represent basilar migraine. I would like to obtain a CT angiogram of the posterior circulation to make sure she does not have any focal stenosis.   I will start her on nortriptyline 10 mg at nighttime as migraine prophylaxis and hopefully that will help her as similar TCA did help her in the past.   She is not a candidate for triptans due to her diagnosis of basilar migraine.   I will follow this patient with you. Feel free to contact me with any further questions.     ____________________________ Royetta Crochet. Manuella Ghazi, MD hks:bjt D: 07/06/2012 11:09:43 ET T: 07/06/2012 11:35:32 ET JOB#: [id number]  cc: Avie Checo K. Manuella Ghazi, MD, <Dictator> Royetta Crochet [facility] MD ELECTRONICALLY SIGNED 07/19/2012 8:41

## 2014-11-20 NOTE — H&P (Signed)
PATIENT NAME:  Monique Daniels, Monique Daniels MR#:  [id number] DATE OF BIRTH:  03/06/1961  DATE OF ADMISSION:  07/05/2012  REFERRING PHYSICIAN: Marta Antu, MD   PRIMARY CARE PHYSICIAN:  Monique Land, NP  CHIEF COMPLAINT: Left face numbness, right side weakness.   HISTORY OF PRESENT ILLNESS: Monique Daniels is a very nice 54 year old female who has history of being healthy for most of her life until last year when she had an episode of what looked to be a CVA/TIA. At that moment she was admitted, observed, and work-up was done with no finding of any signs of cerebrovascular accident or TIA. The patient was discharged and she has been doing okay since then. Three weeks ago she started having feeling of dizziness and lightheadedness for what she went to see her primary care physician, came to the ER first because she could not get through right away. Her blood pressure was significantly elevated. The patient followed up with her primary care physician and was started on bisoprolol and hydralazine. She continues to have the same feelings of dizziness and lightheadedness and within the last five days she developed numbness of the left side of her face. This was intermittent and overall lasting only minutes. Right now this morning she developed the same problem and it has not gone away. She still feels like mostly her face is numb on the left side especially around the mouth. The patient also was really dizzy and lightheaded this morning worse than she was the last couple of days for what she decided to come to her work doctor who checked her blood pressure and was significantly elevated at 190's over 60's with heart rate in the 40's. The patient came over here to get evaluated and overall her neurologic exam is unremarkable other than subjective sensation of numbness of her right lower extremity and left side of her face. Her CT of the head is negative for stroke.   REVIEW OF SYSTEMS: CONSTITUTIONAL: Denies any fever,  fatigue. Positive weakness progressive for the past three weeks. Negative for weight loss or weight gain. EYES: Denies any blurry vision. No cataracts. No glaucoma. ENT: No tinnitus. No difficulty swallowing. No postnasal drip. No dysarthria. RESPIRATORY: No cough, wheezing, or hemoptysis. CARDIOVASCULAR: No chest pain, orthopnea, edema, arrhythmias, or palpitations. No syncopal episodes. GI: No nausea or vomiting. No hematemesis. No rectal bleeding. GU: Negative dysuria, hematuria, or increased frequency. GYN: No breast masses. ENDOCRINE: No polyuria, polydipsia, or polyphagia. No thyroid problems. No cold or heat intolerance. HEME/LYMPH: Negative for anemia, easy bruising, or swollen glands. SKIN: Without any significant rashes or petechiae. MUSCULOSKELETAL: No pain in joints, neck, back. No gout. NEUROLOGIC: Positive for previous episode similar to this a year ago. Negative vertigo. Negative tremor. She was diagnosed with basilar migraines the last time she was here but she states she does not have any headaches. PSYCHIATRIC: Negative for anxiety or depression.   PAST MEDICAL HISTORY:  1. Hypertension, recently diagnosed.  2. History of previous vascular headache apparently.   MEDICATIONS: Bisoprolol with hydrochlorothiazide.   SOCIAL HISTORY: The patient denies any alcohol, tobacco, or drug use. She is very active, exercises weekly. Works for a [employer].   FAMILY HISTORY: Positive for coronary artery disease in her father.   PAST SURGICAL HISTORY: Bilateral tubal ligation.   ALLERGIES: No known drug allergies.   PHYSICAL EXAMINATION:   VITAL SIGNS: Blood pressure 132/66, pulse 48, respirations 18, temperature 97.6.   GENERAL: The patient is alert, oriented x3. No acute  distress. No respiratory distress. She looks very tired.   HEENT: Her pupils are equal and reactive. Extraocular movements are intact. Mucosa slightly dry. No oral lesions. No oropharyngeal exudates.   NECK:  Supple. No JVD. No thyromegaly. No adenopathy. No carotid bruits. No tenderness to palpation of the neck or spinal processes.   CARDIOVASCULAR: Regular rate and rhythm, bradycardic. No murmurs, rubs, or gallops. No displacement of PMI.   LUNGS: Clear without any wheezing or crepitus. No dullness to percussion. No use of accessory muscles.   ABDOMEN: Soft, nontender, nondistended. No hepatosplenomegaly. No masses. Bowel sounds are positive.   GENITAL: Deferred.   EXTREMITIES: No edema, no cyanosis, no clubbing. Pulses +2.   NEUROLOGIC: Cranial nerves II through XII intact. There is no significant droop of the corner of the mouth. There is no problem with the patient smiling or blowing up her cheeks. There is no leakage of air. Her strength is 5 out of 5 in four extremities. She subjectively states that she cannot feel me touching her lower extremity on the right as she does on the left. She still has some sensation to pressure but she feels like it's numb and tingling. Babinski negative. Deep tendon reflexes +2 in four extremities. Cerebral artery testing intact.   SKIN: Without any petechiae or rashes.   PSYCHIATRIC: Negative for anxiety or agitation.   MUSCULOSKELETAL: No significant joint deformities or effusions.   LYMPHATICS: No lymphadenopathies in neck or supraclavicular areas.   LABORATORY DATA: Glucose 87, creatinine 0.7. Other electrolytes within normal limits. LFTs within normal limits. Troponin 0.2. White 13.3, platelets 247. Urinalysis negative for signs of infection. She did have a TSH on November 14th that was 1.34. Vitamin B12 982. C-reactive protein was negative. ANA was negative. Rheumatoid factor 9.2. Echo Doppler 2012 had ejection fraction of 55%, left ventricular systolic function which was normal.   ASSESSMENT AND PLAN: This is a 54 year old female with history of hypertension recently diagnosed who comes with chest pain that happened today this morning. Apparently she  started having really tight and pain went away within seconds to minutes but then started having significant dizziness, lightheadedness, and numbness of the left face and right lower extremity and upper extremity.  1. TIA. I think most of her symptoms are related to her new medications. The patient is on metoprolol and she is very bradycardic with heart rate in the 40's. I will stop this medication stat and since the patient has a suspicion for TIA I am going to go ahead and only treat her blood pressure if it is severely elevated. As per literature, treat blood pressures that are below 220 but she gets really symptomatic with high blood pressure. I do not think that this is a significant TIA or a CVA but most likely to be her medication management for what I am going to start treating her blood pressures if they are systolics above 428. We are going to keep an eye on her. We are going to do neuro checks. I do not think she requires an MRI but I am going to get an echocardiogram. She did have one a year ago but now she is bradycardic and had chest pain.  2. Hypertension. I think that she will benefit from having her medication changed. She still has significant elevation of blood pressure in the 160's for what I am going to recommend switching to amlodipine instead of bisoprolol. The secondary effect to watch for that will be swelling of the lower extremities  rather than bradycardia. She may have a little bit of reflex tachycardia instead. We are going to add aspirin to her regimen. We are going to check her lipids. I am not going to add on a lipid medication yet since the patient might not need it. We are going to consult Dr. Manuella Ghazi, neurologist, who saw the patient previously.   TIME SPENT: I spent about 45 minutes with this patient.   CODE STATUS: The patient is a FULL CODE.   She is going to be admitted for observation.   ____________________________ Placedo Sink, MD rsg:drc D: 07/05/2012  16:32:21 ET T: 07/05/2012 17:01:14 ET JOB#: [id number]  cc: [place] Sink, MD, <Dictator> Monique Land, NP Roselie Awkward America Brown MD ELECTRONICALLY SIGNED 07/06/2012 15:41

## 2014-11-20 NOTE — Discharge Summary (Signed)
PATIENT NAME:  Monique Daniels, Monique Daniels MR#:  [id number] DATE OF BIRTH:  10-25-1960  DATE OF ADMISSION:  07/05/2012 DATE OF DISCHARGE:  07/07/2012  ADMITTING PHYSICIAN: Monique Sink, MD  DISCHARGING PHYSICIAN: Monique Lighter, MD  PRIMARY CARE PHYSICIAN: Monique Land, NP  CONSULTANT: Monique Books, MD - Neurology.  DISCHARGE DIAGNOSES:  1. Left facial right arm numbness on presentation due to basilar migraine, MRI negative. 2. Bradycardia.  3. Low normal blood pressure.   DISCHARGE HOME MEDICATION: Nortriptyline 10 mg p.o. at bedtime.   NOTE: The patient is advised to stop her home medications which include: 1. Bisoprolol/HCTZ 10/6.25 mg p.o. daily. 2. HCTZ 12.5 mg p.o. daily.  DISCHARGE HOME OXYGEN: None.   DISCHARGE DIET: Low-sodium diet.   DISCHARGE ACTIVITY: As tolerated.   FOLLOWUP INSTRUCTIONS:  1. Follow up with Dr. Jennings Daniels in 2 to 3 weeks.  2. Follow up with primary care physician for blood pressure in two weeks.  3. The patient was advised to hold her blood pressure medications as mentioned above until seen by PCP.   LABS AND IMAGING STUDIES: WBC 6.7, hemoglobin 11.9, hematocrit 35.7, and platelet count 220.   Sodium 139, potassium 4.0, chloride 108, bicarbonate 26, BUN 14, creatinine 0.73, glucose 96, and calcium 8.5.   Troponin is less than 0.02. Lipase is 79.  Carotid Dopplers showed no significant atherosclerotic plaques seen in extracranial carotid arteries.   MRA of brain: Anterior and posterior circulation shows widely patent circulation. No aneurysm or vascular malformation. No stenosis noted.   MRI of brain: No focal acute abnormality.   Echo Doppler: No cardiac source of emboli identified. LV ejection fraction is greater than 55%. Trace tricuspid regurgitation is present. No evidence of any pulmonary hypertension.   BRIEF HOSPITAL COURSE: Monique Daniels is a 54 year old Caucasian female with past medical history significant for history of  migraine headaches and hypertension who was admitted to the hospital secondary to left facial numbness and also right arm numbness to rule out transient ischemic attack. Blood pressure has fluctuations with elevated blood pressure on admission.  1. Neurological symptoms including facial and right arm numbness, likely from basilar migraine. Dr. Manuella Daniels was consulted in the hospital and the patient has known history of migraine, but she is not a candidate for triptans because of her basilar migraines. MRI and MRA were negative for any acute infarcts or transient ischemic attack and CVA was ruled out. She was started on nortriptyline for migraine prophylaxis as amitriptyline helped her in the past.  2. Sinus bradycardia with low normal blood pressure. She was recently started on blood pressure medications. Her blood pressure stayed stable, within normal range, off of medications and her heart rate was in the 50 to 60 range so her HCTZ and bisoprolol were stopped at the time of discharge. Her course has been otherwise uneventful while in the hospital. Her right arm numbness has completely resolved, but the facial numbness persists and comes intermittently. She will followup with Dr. Manuella Daniels as an outpatient.      DISCHARGE CONDITION: Stable.   DISCHARGE DISPOSITION: Home.   TIME SPENT ON DISCHARGE: 45 minutes. ____________________________ Monique Lighter, MD rk:slb D: 07/07/2012 15:25:56 ET T: 07/08/2012 09:04:07 ET JOB#: [id number]  cc: Monique Lighter, MD, <Dictator> Monique Land, NP Monique K. Monique Ghazi, MD Monique Lighter MD ELECTRONICALLY SIGNED 07/10/2012 13:53

## 2014-11-23 NOTE — Op Note (Signed)
PATIENT NAME:  Monique Daniels, CROTHERS MR#:  [id number] DATE OF BIRTH:  1960-10-14  DATE OF PROCEDURE:  01/19/2013  PREOPERATIVE DIAGNOSES:  1.  Right ureteral calculus.  2.  Right renal colic.   POSTOPERATIVE DIAGNOSIS: Right distal ureteral calculus.   PROCEDURES: 1.  Right ureteroscopy with holmium laser lithotripsy/stone extraction.  2.  Placement of right ureteral stent.   SURGEON: John Giovanni, M.D.   ASSISTANT: None.   ANESTHESIA: General.   INDICATIONS: This is a 54 year old female who presented to the office on June 18, with a one-week history of severe right renal colic, nausea and poor p.o. intake. She was admitted for parenteral analgesia and pain control and after discussion of treatment options has elected ureteroscopic removal.   DESCRIPTION OF PROCEDURE: She was taken to the operating room and placed supine position and a general anesthetic was administered by anesthesia. She was then placed in the low lithotomy position and her external genitalia were prepped and draped in the usual fashion. Timeout was performed per protocol, with all in agreement. A 21 French cystoscope sheath with obturator was lubricated and passed per urethra. Panendoscopy was performed and the bladder mucosa was normal in appearance without erythema, solid or papillary lesions. The left ureteral orifice was normal-appearing with clear efflux. The right ureteral orifices were normal appearing; however, no efflux was seen. A 0.035 Glidewire was placed through the cystoscope and into the right ureteral orifice. There was slight resistance as the wire was negotiated past the stone and then a large rush of urine with particular matter was seen pouring from the orifice. A wire was passed up to the right renal pelvis under fluoroscopic guidance and the cystoscope was removed. A 6 French semirigid ureteroscope was passed per urethra. A 0.025 guidewire was placed up ureteroscope and into the right orifice and the  cystoscope was advanced over the wire. The distal ureter was slightly tight, but able to be negotiated with the scope without difficulty. In the lower portion distal ureter, a stone was identified. A 365 micron holmium laser fiber was placed with the ureteroscope. The stone was fragmented at a power of 6.4 watts. Total energy expended with 0.36 kilojoules. Stone fragments were removed with the aid of a 3 [place] nitinol basket. The ureteroscope was then passed all the way up to the renal pelvis. No other stones or stone fragments were seen. The scope was removed. A 6 French/24 cm Microvasive Contour ureteral stent was placed over the wire. There was good curl seen in the renal pelvis under fluoroscopy. Cystoscope was repassed and the distal end of the stent was well positioned in the bladder. The stent was left attached to a pull string and will be removed at a later date. A B and O suppository was placed per rectum. She was then taken to the postanesthesia care unit in stable condition. There were no complications. EBL was minimal.     ____________________________ Ronda Fairly. Bernardo Heater, MD scs:cc D: 01/19/2013 19:33:15 ET T: 01/19/2013 20:47:07 ET JOB#: [id number]  cc: Nicki Reaper C. Bernardo Heater, MD, <Dictator> Abbie Sons MD ELECTRONICALLY SIGNED 01/25/2013 13:21

## 2014-11-27 ENCOUNTER — Ambulatory Visit
Admit: 2014-11-27 | Disposition: A | Discharge status: Home / Self Care | Payer: Self-pay | Attending: Obstetrics and Gynecology | Admitting: Obstetrics and Gynecology

## 2015-01-08 ENCOUNTER — Telehealth: Payer: Self-pay

## 2015-01-08 ENCOUNTER — Other Ambulatory Visit: Payer: Self-pay | Admitting: Family Medicine

## 2015-01-08 NOTE — Telephone Encounter (Signed)
Sent refill, but she needs to schedule a follow up

## 2015-01-09 NOTE — Telephone Encounter (Signed)
APPOINTMENT HAS BEEN SCHEDULED FOR 01-28-15 @ 3:45P

## 2015-01-13 ENCOUNTER — Other Ambulatory Visit: Payer: Self-pay | Admitting: Family Medicine

## 2015-01-19 ENCOUNTER — Other Ambulatory Visit: Payer: Self-pay | Admitting: Family Medicine

## 2015-01-27 ENCOUNTER — Encounter: Payer: Self-pay | Admitting: Family Medicine

## 2015-01-27 DIAGNOSIS — G5600 Carpal tunnel syndrome, unspecified upper limb: Secondary | ICD-10-CM | POA: Insufficient documentation

## 2015-01-27 DIAGNOSIS — I10 Essential (primary) hypertension: Secondary | ICD-10-CM | POA: Insufficient documentation

## 2015-01-27 DIAGNOSIS — M5030 Other cervical disc degeneration, unspecified cervical region: Secondary | ICD-10-CM | POA: Insufficient documentation

## 2015-01-27 DIAGNOSIS — G4700 Insomnia, unspecified: Secondary | ICD-10-CM | POA: Insufficient documentation

## 2015-01-27 DIAGNOSIS — N83209 Unspecified ovarian cyst, unspecified side: Secondary | ICD-10-CM | POA: Insufficient documentation

## 2015-01-27 DIAGNOSIS — R0683 Snoring: Secondary | ICD-10-CM | POA: Insufficient documentation

## 2015-01-27 DIAGNOSIS — E559 Vitamin D deficiency, unspecified: Secondary | ICD-10-CM | POA: Insufficient documentation

## 2015-01-27 DIAGNOSIS — D229 Melanocytic nevi, unspecified: Secondary | ICD-10-CM | POA: Insufficient documentation

## 2015-01-27 DIAGNOSIS — Z8673 Personal history of transient ischemic attack (TIA), and cerebral infarction without residual deficits: Secondary | ICD-10-CM | POA: Insufficient documentation

## 2015-01-27 DIAGNOSIS — IMO0002 Reserved for concepts with insufficient information to code with codable children: Secondary | ICD-10-CM | POA: Insufficient documentation

## 2015-01-27 DIAGNOSIS — N200 Calculus of kidney: Secondary | ICD-10-CM | POA: Insufficient documentation

## 2015-01-27 DIAGNOSIS — K219 Gastro-esophageal reflux disease without esophagitis: Secondary | ICD-10-CM | POA: Insufficient documentation

## 2015-01-27 DIAGNOSIS — F410 Panic disorder [episodic paroxysmal anxiety] without agoraphobia: Secondary | ICD-10-CM | POA: Insufficient documentation

## 2015-01-27 DIAGNOSIS — G43409 Hemiplegic migraine, not intractable, without status migrainosus: Secondary | ICD-10-CM | POA: Insufficient documentation

## 2015-01-28 ENCOUNTER — Ambulatory Visit (INDEPENDENT_AMBULATORY_CARE_PROVIDER_SITE_OTHER): Payer: BLUE CROSS/BLUE SHIELD | Admitting: Family Medicine

## 2015-01-28 ENCOUNTER — Encounter: Payer: Self-pay | Admitting: Family Medicine

## 2015-01-28 VITALS — BP 148/82 | HR 96 | Temp 98.1°F | Resp 16 | Ht 63.0 in | Wt 224.5 lb

## 2015-01-28 DIAGNOSIS — M542 Cervicalgia: Secondary | ICD-10-CM

## 2015-01-28 DIAGNOSIS — G43409 Hemiplegic migraine, not intractable, without status migrainosus: Secondary | ICD-10-CM

## 2015-01-28 DIAGNOSIS — IMO0002 Reserved for concepts with insufficient information to code with codable children: Secondary | ICD-10-CM

## 2015-01-28 DIAGNOSIS — G4700 Insomnia, unspecified: Secondary | ICD-10-CM | POA: Diagnosis not present

## 2015-01-28 DIAGNOSIS — E668 Other obesity: Secondary | ICD-10-CM

## 2015-01-28 DIAGNOSIS — F418 Other specified anxiety disorders: Secondary | ICD-10-CM

## 2015-01-28 DIAGNOSIS — F32A Depression, unspecified: Secondary | ICD-10-CM

## 2015-01-28 DIAGNOSIS — F419 Anxiety disorder, unspecified: Secondary | ICD-10-CM

## 2015-01-28 DIAGNOSIS — I10 Essential (primary) hypertension: Secondary | ICD-10-CM | POA: Diagnosis not present

## 2015-01-28 DIAGNOSIS — Z8719 Personal history of other diseases of the digestive system: Secondary | ICD-10-CM | POA: Insufficient documentation

## 2015-01-28 DIAGNOSIS — F329 Major depressive disorder, single episode, unspecified: Secondary | ICD-10-CM

## 2015-01-28 DIAGNOSIS — K219 Gastro-esophageal reflux disease without esophagitis: Secondary | ICD-10-CM | POA: Diagnosis not present

## 2015-01-28 MED ORDER — HYDROCHLOROTHIAZIDE 25 MG PO TABS: 25.0000 mg | ORAL_TABLET | Freq: Two times a day (BID) | ORAL | Status: DC

## 2015-01-28 MED ORDER — TRAZODONE HCL 50 MG PO TABS: 50.0000 mg | ORAL_TABLET | Freq: Every day | ORAL | Status: DC

## 2015-01-28 MED ORDER — TOPIRAMATE 100 MG PO TABS: 100.0000 mg | ORAL_TABLET | Freq: Two times a day (BID) | ORAL | Status: DC

## 2015-01-28 MED ORDER — OMEPRAZOLE 20 MG PO CPDR: 20.0000 mg | DELAYED_RELEASE_CAPSULE | Freq: Two times a day (BID) | ORAL | Status: DC

## 2015-01-28 MED ORDER — AMLODIPINE BESYLATE 5 MG PO TABS: 5.0000 mg | ORAL_TABLET | Freq: Every day | ORAL | Status: DC

## 2015-01-28 MED ORDER — CYCLOBENZAPRINE HCL 10 MG PO TABS: 10.0000 mg | ORAL_TABLET | Freq: Every day | ORAL | Status: DC

## 2015-01-28 NOTE — Patient Instructions (Signed)
Potassium Content of Foods  Potassium is a mineral found in many foods and drinks. It helps keep fluids and minerals balanced in your body and affects how steadily your heart beats. Potassium also helps control your blood pressure and keep your muscles and nervous system healthy.  Certain health conditions and medicines may change the balance of potassium in your body. When this happens, you can help balance your level of potassium through the foods that you do or do not eat. Your health care provider or dietitian may recommend an amount of potassium that you should have each day. The following lists of foods provide the amount of potassium (in parentheses) per serving in each item.  HIGH IN POTASSIUM   The following foods and beverages have 200 mg or more of potassium per serving:  · Apricots, 2 raw or 5 dry (200 mg).  · Artichoke, 1 medium (345 mg).  · Avocado, raw,  ¼ each (245 mg).  · Banana, 1 medium (425 mg).  · Beans, lima, or baked beans, canned, ½ cup (280 mg).  · Beans, white, canned, ½ cup (595 mg).  · Beef roast, 3 oz (320 mg).  · Beef, ground, 3 oz (270 mg).  · Beets, raw or cooked, ½ cup (260 mg).  · Bran muffin, 2 oz (300 mg).  · Broccoli, ½ cup (230 mg).  · Brussels sprouts, ½ cup (250 mg).  · Cantaloupe, ½ cup (215 mg).  · Cereal, 100% bran, ½ cup (200-400 mg).  · Cheeseburger, single, fast food, 1 each (225-400 mg).  · Chicken, 3 oz (220 mg).  · Clams, canned, 3 oz (535 mg).  · Crab, 3 oz (225 mg).  · Dates, 5 each (270 mg).  · Dried beans and peas, ½ cup (300-475 mg).  · Figs, dried, 2 each (260 mg).  · Fish: halibut, tuna, cod, snapper, 3 oz (480 mg).  · Fish: salmon, haddock, swordfish, perch, 3 oz (300 mg).  · Fish, tuna, canned 3 oz (200 mg).  · French fries, fast food, 3 oz (470 mg).  · Granola with fruit and nuts, ½ cup (200 mg).  · Grapefruit juice, ½ cup (200 mg).  · Greens, beet, ½ cup (655 mg).  · Honeydew melon, ½ cup (200 mg).  · Kale, raw, 1 cup (300 mg).  · Kiwi, 1 medium (240  mg).  · Kohlrabi, rutabaga, parsnips, ½ cup (280 mg).  · Lentils, ½ cup (365 mg).  · Mango, 1 each (325 mg).  · Milk, chocolate, 1 cup (420 mg).  · Milk: nonfat, low-fat, whole, buttermilk, 1 cup (350-380 mg).  · Molasses, 1 Tbsp (295 mg).  · Mushrooms, ½ cup (280) mg.  · Nectarine, 1 each (275 mg).  · Nuts: almonds, peanuts, hazelnuts, [place], cashew, mixed, 1 oz (200 mg).  · Nuts, pistachios, 1 oz (295 mg).  · Orange, 1 each (240 mg).  · Orange juice, ½ cup (235 mg).  · Papaya, medium, ½ fruit (390 mg).  · Peanut butter, chunky, 2 Tbsp (240 mg).  · Peanut butter, smooth, 2 Tbsp (210 mg).  · Pear, 1 medium (200 mg).  · Pomegranate, 1 whole (400 mg).  · Pomegranate juice, ½ cup (215 mg).  · Pork, 3 oz (350 mg).  · Potato chips, salted, 1 oz (465 mg).  · Potato, baked with skin, 1 medium (925 mg).  · Potatoes, boiled, ½ cup (255 mg).  · Potatoes, mashed, ½ cup (330 mg).  · Prune juice, ½ cup (  370 mg).  · Prunes, 5 each (305 mg).  · Pudding, chocolate, ½ cup (230 mg).  · Pumpkin, canned, ½ cup (250 mg).  · Raisins, seedless, ¼ cup (270 mg).  · Seeds, sunflower or pumpkin, 1 oz (240 mg).  · Soy milk, 1 cup (300 mg).  · Spinach, ½ cup (420 mg).  · Spinach, canned, ½ cup (370 mg).  · Sweet potato, baked with skin, 1 medium (450 mg).  · Swiss chard, ½ cup (480 mg).  · Tomato or vegetable juice, ½ cup (275 mg).  · Tomato sauce or puree, ½ cup (400-550 mg).  · Tomato, raw, 1 medium (290 mg).  · Tomatoes, canned, ½ cup (200-300 mg).  · Turkey, 3 oz (250 mg).  · Wheat germ, 1 oz (250 mg).  · Winter squash, ½ cup (250 mg).  · Yogurt, plain or fruited, 6 oz (260-435 mg).  · Zucchini, ½ cup (220 mg).  MODERATE IN POTASSIUM  The following foods and beverages have 50-200 mg of potassium per serving:  · Apple, 1 each (150 mg).  · Apple juice, ½ cup (150 mg).  · Applesauce, ½ cup (90 mg).  · Apricot nectar, ½ cup (140 mg).  · Asparagus, small spears, ½ cup or 6 spears (155 mg).  · Bagel, cinnamon raisin, 1 each (130 mg).  · Bagel,  egg or plain, 4 in., 1 each (70 mg).  · Beans, green, ½ cup (90 mg).  · Beans, yellow, ½ cup (190 mg).  · Beer, regular, 12 oz (100 mg).  · Beets, canned, ½ cup (125 mg).  · Blackberries, ½ cup (115 mg).  · Blueberries, ½ cup (60 mg).  · Bread, whole wheat, 1 slice (70 mg).  · Broccoli, raw, ½ cup (145 mg).  · Cabbage, ½ cup (150 mg).  · Carrots, cooked or raw, ½ cup (180 mg).  · Cauliflower, raw, ½ cup (150 mg).  · Celery, raw, ½ cup (155 mg).  · Cereal, bran flakes, ½cup (120-150 mg).  · Cheese, cottage, ½ cup (110 mg).  · Cherries, 10 each (150 mg).  · Chocolate, 1½ oz bar (165 mg).  · Coffee, brewed 6 oz (90 mg).  · Corn, ½ cup or 1 ear (195 mg).  · Cucumbers, ½ cup (80 mg).  · Egg, large, 1 each (60 mg).  · Eggplant, ½ cup (60 mg).  · Endive, raw, ½cup (80 mg).  · English muffin, 1 each (65 mg).  · Fish, orange roughy, 3 oz (150 mg).  · Frankfurter, beef or pork, 1 each (75 mg).  · Fruit cocktail, ½ cup (115 mg).  · Grape juice, ½ cup (170 mg).  · Grapefruit, ½ fruit (175 mg).  · Grapes, ½ cup (155 mg).  · Greens: kale, turnip, collard, ½ cup (110-150 mg).  · Ice cream or frozen yogurt, chocolate, ½ cup (175 mg).  · Ice cream or frozen yogurt, vanilla, ½ cup (120-150 mg).  · Lemons, limes, 1 each (80 mg).  · Lettuce, all types, 1 cup (100 mg).  · Mixed vegetables, ½ cup (150 mg).  · Mushrooms, raw, ½ cup (110 mg).  · Nuts: walnuts, pecans, or macadamia, 1 oz (125 mg).  · Oatmeal, ½ cup (80 mg).  · Okra, ½ cup (110 mg).  · Onions, raw, ½ cup (120 mg).  · Peach, 1 each (185 mg).  · Peaches, canned, ½ cup (120 mg).  · Pears, canned, ½ cup (120 mg).  · Peas, green,   frozen, ½ cup (90 mg).  · Peppers, green, ½ cup (130 mg).  · Peppers, red, ½ cup (160 mg).  · Pineapple juice, ½ cup (165 mg).  · Pineapple, fresh or canned, ½ cup (100 mg).  · Plums, 1 each (105 mg).  · Pudding, vanilla, ½ cup (150 mg).  · Raspberries, ½ cup (90 mg).  · Rhubarb, ½ cup (115 mg).  · Rice, wild, ½ cup (80 mg).  · Shrimp, 3 oz (155  mg).  · Spinach, raw, 1 cup (170 mg).  · Strawberries, ½ cup (125 mg).  · Summer squash ½ cup (175-200 mg).  · Swiss chard, raw, 1 cup (135 mg).  · Tangerines, 1 each (140 mg).  · Tea, brewed, 6 oz (65 mg).  · Turnips, ½ cup (140 mg).  · Watermelon, ½ cup (85 mg).  · Wine, red, table, 5 oz (180 mg).  · Wine, white, table, 5 oz (100 mg).  LOW IN POTASSIUM  The following foods and beverages have less than 50 mg of potassium per serving.  · Bread, white, 1 slice (30 mg).  · Carbonated beverages, 12 oz (less than 5 mg).  · Cheese, 1 oz (20-30 mg).  · Cranberries, ½ cup (45 mg).  · Cranberry juice cocktail, ½ cup (20 mg).  · Fats and oils, 1 Tbsp (less than 5 mg).  · Hummus, 1 Tbsp (32 mg).  · Nectar: papaya, mango, or pear, ½ cup (35 mg).  · Rice, white or brown, ½ cup (50 mg).  · Spaghetti or macaroni, ½ cup cooked (30 mg).  · Tortilla, flour or corn, 1 each (50 mg).  · Waffle, 4 in., 1 each (50 mg).  · Water chestnuts, ½ cup (40 mg).  Document Released: 03/03/2005 Document Revised: 07/25/2013 Document Reviewed: 06/16/2013  Exit[employer]® Patient Information ©2015 [employer]. This information is not intended to replace advice given to you by your health care provider. Make sure you discuss any questions you have with your health care provider.

## 2015-01-28 NOTE — Progress Notes (Signed)
Name: Monique Daniels   MRN: [id number]    DOB: [birth year]   Date:01/28/2015       Progress Note  Subjective  Chief Complaint  Chief Complaint  Patient presents with  . Medication Refill    3 month F/U  . Anxiety    Improving since taking medication, 1/2 of pill every 3 days to control anxiety.  . Hypertension    Headaches   . Migraine    due to cervical pain  . Insomnia    Feeling tired all the time, trouble falling asleep. Feels like it maybe due to menopause     HPI  Depression/Anxiety: she is now on Alprazolam, she used to take SSRI's in the past but she is doing better and want to take only prn medication. She is able to focus at work, she still feels tired all the time.   HTN: she is only taking medication in am's stopped taking it twice daily, bp was high when she arrived but improved before she left.  Migraine: she is taking Nortriptyline every night, taking Cyclobenzaprine for neck pain prn, but is having episodes of migraine lately, about 2 times per week, over the past 3 months. She states it seems to be triggered by neck pain that is getting worse.  Migraine is associated with nausea, photophobia and phonophobia. She has seen a neurologist in the past , but she does not want to go back at this time.   GERD: feeling bloated over the past couple months, sometimes gets a wave of nausea, denies abdominal pain, but has noticed early satiety. Symptoms started after we decreased dose to once daily.    Insomnia: controlled with medication and needs refills  Cervical pain: she has DDD and symptoms are getting more frequent. Advised to take Cyclobenzaprine every night and Tylenol three times daily for one week  Patient Active Problem List   Diagnosis Date Noted  . History of esophageal stricture 01/28/2015  . Atypical nevus 01/27/2015  . Carpal tunnel syndrome 01/27/2015  . Cervical pain 01/27/2015  . Insomnia, persistent 01/27/2015  . DDD (degenerative disc disease),  cervical 01/27/2015  . Anxiety and depression 01/27/2015  . Gastric reflux 01/27/2015  . Headache, hemiplegic migraine 01/27/2015  . H/O transient cerebral ischemia 01/27/2015  . Hypertension, benign 01/27/2015  . Calculus of kidney 01/27/2015  . Adult BMI 30+ 01/27/2015  . Vitamin D deficiency 01/27/2015  . Snores 01/27/2015  . Aneurysm, cerebral, nonruptured 05/12/2013  . Palpitations 07/15/2012    Past Surgical History  Procedure Laterality Date  . Tubal ligation  1989  . Tooth extraction      Family History  Problem Relation Age of Onset  . Asthma Mother   . Heart disease Mother   . Hyperlipidemia Mother   . Asthma Son   . Heart attack Father   . Emphysema Mother   . Urolithiasis Son   . Tremor Mother   . Tremor Sister   . Tremor Brother   . Macular degeneration Mother   . Macular degeneration Maternal Grandmother     History   Social History  . Marital Status: Married    Spouse Name: Merry Proud  . Number of Children: 2  . Years of Education: 12th   Occupational History  . Medical records    Social History Main Topics  . Smoking status: Never Smoker   . Smokeless tobacco: Never Used  . Alcohol Use: No  . Drug Use: No  . Sexual Activity:  Partners: Male   Other Topics Concern  . Not on file   Social History Narrative   Patient lives at home with husband Merry Proud.    Patient works at [employer].    Patient has a high school education.    Patient has 2 children.      Current outpatient prescriptions:  .  ALPRAZolam (XANAX) 0.5 MG tablet, Take 1 tablet by mouth daily as needed., Disp: , Rfl:  .  amLODipine (NORVASC) 5 MG tablet, Take 1 tablet (5 mg total) by mouth daily., Disp: 90 tablet, Rfl: 1 .  aspirin-acetaminophen-caffeine (EXCEDRIN MIGRAINE) 250-250-65 MG per tablet, Take 2 tablets by mouth every 6 (six) hours as needed for pain., Disp: , Rfl:  .  Biotin (BIOTIN 5000) 5 MG CAPS, Take 1 tablet by mouth daily., Disp: , Rfl:  .  cetirizine  (ZYRTEC) 10 MG tablet, Take 1 tablet by mouth as needed., Disp: , Rfl:  .  Cholecalciferol (VITAMIN D) 2000 UNITS tablet, Take 2,000 Units by mouth daily as needed (for energy). , Disp: , Rfl:  .  cyclobenzaprine (FLEXERIL) 10 MG tablet, Take 1 tablet (10 mg total) by mouth at bedtime., Disp: 90 tablet, Rfl: 0 .  hydrochlorothiazide (HYDRODIURIL) 25 MG tablet, Take 1 tablet (25 mg total) by mouth 2 (two) times daily., Disp: 180 tablet, Rfl: 1 .  ibuprofen (ADVIL,MOTRIN) 200 MG tablet, Take 400 mg by mouth every 6 (six) hours as needed for pain or headache. , Disp: , Rfl:  .  nortriptyline (PAMELOR) 10 MG capsule, TAKE 1 CAPSULE BY MOUTH AT BEDTIME., Disp: 30 capsule, Rfl: 1 .  omeprazole (PRILOSEC) 20 MG capsule, Take 1 capsule (20 mg total) by mouth 2 (two) times daily., Disp: 180 capsule, Rfl: 1 .  sodium chloride (OCEAN) 0.65 % nasal spray, Place 1 spray into both nostrils daily as needed (for allergies). , Disp: , Rfl:  .  traZODone (DESYREL) 50 MG tablet, Take 1 tablet (50 mg total) by mouth at bedtime., Disp: 90 tablet, Rfl: 1  Allergies  Allergen Reactions  . Codeine Other (See Comments)    Severe headache   . Loratadine-Pseudoephedrine Er Other (See Comments)  . Pseudoephedrine Other (See Comments)  . Tramadol Nausea Only     ROS  Constitutional: Negative for fever or weight change.  Respiratory: Negative for cough and shortness of breath.   Cardiovascular: Negative for chest pain or palpitations.  Gastrointestinal: Negative for abdominal pain, no bowel changes.  Musculoskeletal: Negative for gait problem or joint swelling.  Skin: Negative for rash.  Neurological: Negative for dizziness , positive for headache No other specific complaints in a complete review of systems (except as listed in HPI above). Objective  Filed Vitals:   01/28/15 1600  BP: 148/82  Pulse: 96  Temp: 98.1 F (36.7 C)  TempSrc: Oral  Resp: 16  Height: 5\' 3"  (1.6 m)  Weight: 224 lb 8 oz (101.833  kg)  SpO2: 94%    Body mass index is 39.78 kg/(m^2).  Physical Exam  Constitutional: Patient appears well-developed and well-nourished. No distress.  Eyes:  No scleral icterus.  Neck: Normal range of motion. Neck supple. Cardiovascular: Normal rate, regular rhythm and normal heart sounds.  No murmur heard. No BLE edema. Pulmonary/Chest: Effort normal and breath sounds normal. No respiratory distress. Abdominal: Soft.  There is no tenderness. Psychiatric: Patient has a normal mood and affect. behavior is normal. Judgment and thought content normal.  Recent Results (from the past 2160 hour(s))  POCT erythrocyte sed rate, Non-automated     Status: None   Collection Time: 11/08/14 12:00 AM  Result Value Ref Range   Sed Rate 10 mm     PHQ2/9: Depression screen PHQ 2/9 01/28/2015  Decreased Interest 0  Down, Depressed, Hopeless 0  PHQ - 2 Score 0     Fall Risk: Fall Risk  01/28/2015  Falls in the past year? No    Assessment & Plan  1. Hypertension, benign  - hydrochlorothiazide (HYDRODIURIL) 25 MG tablet; Take 1 tablet (25 mg total) by mouth 2 (two) times daily.  Dispense: 180 tablet; Refill: 1 - amLODipine (NORVASC) 5 MG tablet; Take 1 tablet (5 mg total) by mouth daily.  Dispense: 90 tablet; Refill: 1  2. Anxiety and depression Stopped Lexapro, continue alprazolam prn  3. Cervical pain Discussed PT or chiropractor therapy also - cyclobenzaprine (FLEXERIL) 10 MG tablet; Take 1 tablet (10 mg total) by mouth at bedtime.  Dispense: 90 tablet; Refill: 0  4. Insomnia, persistent Doing well  - traZODone (DESYREL) 50 MG tablet; Take 1 tablet (50 mg total) by mouth at bedtime.  Dispense: 90 tablet; Refill: 1  5. Adult BMI 30+ Discussed diet and exercise  6. Gastric reflux We will increase dose to twice daily and if no improvement refer back to GI - omeprazole (PRILOSEC) 20 MG capsule; Take 1 capsule (20 mg total) by mouth 2 (two) times daily.  Dispense: 180 capsule;  Refill: 1  7. Hemiplegic migraine without status migrainosus, not intractable We will try topamax, frequency has gone up to twice weekly  - topiramate (TOPAMAX) 100 MG tablet; Take 1 tablet (100 mg total) by mouth 2 (two) times daily.  Dispense: 60 tablet; Refill: 0

## 2015-01-30 ENCOUNTER — Encounter
Admission: RE | Admit: 2015-01-30 | Discharge: 2015-01-30 | Disposition: A | Discharge status: Home / Self Care | Payer: BLUE CROSS/BLUE SHIELD | Source: Ambulatory Visit | Attending: Obstetrics and Gynecology | Admitting: Obstetrics and Gynecology

## 2015-01-30 DIAGNOSIS — Z01812 Encounter for preprocedural laboratory examination: Secondary | ICD-10-CM | POA: Diagnosis present

## 2015-01-30 LAB — COMPREHENSIVE METABOLIC PANEL
ALK PHOS: 58 U/L (ref 38–126)
ALT: 20 U/L (ref 14–54)
ANION GAP: 8 (ref 5–15)
AST: 20 U/L (ref 15–41)
Albumin: 3.8 g/dL (ref 3.5–5.0)
BILIRUBIN TOTAL: 0.6 mg/dL (ref 0.3–1.2)
BUN: 11 mg/dL (ref 6–20)
CO2: 28 mmol/L (ref 22–32)
CREATININE: 0.75 mg/dL (ref 0.44–1.00)
Calcium: 9.2 mg/dL (ref 8.9–10.3)
Chloride: 103 mmol/L (ref 101–111)
GFR calc non Af Amer: >60 mL/min (ref >60)
Glucose, Bld: 94 mg/dL (ref 65–99)
Potassium: 3.2 mmol/L — ABNORMAL LOW (ref 3.5–5.1)
Sodium: 139 mmol/L (ref 135–145)
TOTAL PROTEIN: 7.3 g/dL (ref 6.5–8.1)

## 2015-01-30 LAB — TYPE AND SCREEN
ABO/RH(D): O POS
ANTIBODY SCREEN: NEGATIVE

## 2015-01-30 LAB — CBC
HCT: 41.9 % (ref 35.0–47.0)
Hemoglobin: 13.8 g/dL (ref 12.0–16.0)
MCH: 27.3 pg (ref 26.0–34.0)
MCHC: 33.1 g/dL (ref 32.0–36.0)
MCV: 82.7 fL (ref 80.0–100.0)
Platelets: 274 10*3/uL (ref 150–440)
RBC: 5.06 MIL/uL (ref 3.80–5.20)
RDW: 14 % (ref 11.5–14.5)
WBC: 9.1 10*3/uL (ref 3.6–11.0)

## 2015-01-30 NOTE — Patient Instructions (Signed)
  Your procedure is scheduled on: Thursday February 07, 2015 Report to Same Day Surgery. To find out your arrival time please call [phone] between 1PM - 3PM on Wednesday February 06, 2015.  Remember: Instructions that are not followed completely may result in serious medical risk, up to and including death, or upon the discretion of your surgeon and anesthesiologist your surgery may need to be rescheduled.    __x__ 1. Do not eat food or drink liquids after midnight. No gum chewing or hard candies.     ____ 2. No Alcohol for 24 hours before or after surgery.   ____ 3. Bring all medications with you on the day of surgery if instructed.    __x__ 4. Notify your doctor if there is any change in your medical condition     (cold, fever, infections).     Do not wear jewelry, make-up, hairpins, clips or nail polish.  Do not wear lotions, powders, or perfumes. You may wear deodorant.  Do not shave 48 hours prior to surgery. Men may shave face and neck.  Do not bring valuables to the hospital.    [facility] is not responsible for any belongings or valuables.               Contacts, dentures or bridgework may not be worn into surgery.  Leave your suitcase in the car. After surgery it may be brought to your room.  For patients admitted to the hospital, discharge time is determined by your treatment team.   Patients discharged the day of surgery will not be allowed to drive home.    Please read over the following fact sheets that you were given:    _x_ Take these medicines the morning of surgery with A SIP OF WATER:    1. amLODipine (NORVASC)   2. omeprazole (PRILOSEC)  3. topiramate (TOPAMAX) if needed.  ____ Fleet Enema (as directed)   ____ Use CHG Soap as directed  ____ Use inhalers on the day of surgery  ____ Stop metformin 2 days prior to surgery    ____ Take 1/2 of usual insulin dose the night before surgery and none on the morning of surgery.   ____ Stop Coumadin/Plavix/aspirin  on does not apply  __x__ Stop Anti-inflammatories (Advil) on now. Tylenol may be taken for pain.  _x___ Stop supplements until after surgery.  Biotin  ____ Bring C-Pap to the hospital.

## 2015-01-31 LAB — ABO/RH: ABO/RH(D): O POS

## 2015-02-04 ENCOUNTER — Other Ambulatory Visit: Payer: Self-pay | Admitting: Family Medicine

## 2015-02-07 ENCOUNTER — Ambulatory Visit: Payer: BLUE CROSS/BLUE SHIELD | Admitting: Anesthesiology

## 2015-02-07 ENCOUNTER — Encounter
Admission: RE | Disposition: A | Discharge status: Home / Self Care | Payer: Self-pay | Source: Ambulatory Visit | Attending: Obstetrics and Gynecology

## 2015-02-07 ENCOUNTER — Ambulatory Visit
Admission: RE | Admit: 2015-02-07 | Discharge: 2015-02-07 | Disposition: A | Discharge status: Home / Self Care | Payer: BLUE CROSS/BLUE SHIELD | Source: Ambulatory Visit | Attending: Obstetrics and Gynecology | Admitting: Obstetrics and Gynecology

## 2015-02-07 ENCOUNTER — Encounter: Payer: Self-pay | Admitting: *Deleted

## 2015-02-07 DIAGNOSIS — R51 Headache: Secondary | ICD-10-CM | POA: Diagnosis not present

## 2015-02-07 DIAGNOSIS — I10 Essential (primary) hypertension: Secondary | ICD-10-CM | POA: Insufficient documentation

## 2015-02-07 DIAGNOSIS — Z885 Allergy status to narcotic agent status: Secondary | ICD-10-CM | POA: Insufficient documentation

## 2015-02-07 DIAGNOSIS — Z8673 Personal history of transient ischemic attack (TIA), and cerebral infarction without residual deficits: Secondary | ICD-10-CM | POA: Insufficient documentation

## 2015-02-07 DIAGNOSIS — Z79899 Other long term (current) drug therapy: Secondary | ICD-10-CM | POA: Insufficient documentation

## 2015-02-07 DIAGNOSIS — F419 Anxiety disorder, unspecified: Secondary | ICD-10-CM | POA: Diagnosis not present

## 2015-02-07 DIAGNOSIS — R002 Palpitations: Secondary | ICD-10-CM | POA: Diagnosis not present

## 2015-02-07 DIAGNOSIS — Z800 Family history of malignant neoplasm of digestive organs: Secondary | ICD-10-CM | POA: Diagnosis not present

## 2015-02-07 DIAGNOSIS — Z8041 Family history of malignant neoplasm of ovary: Secondary | ICD-10-CM | POA: Insufficient documentation

## 2015-02-07 DIAGNOSIS — N920 Excessive and frequent menstruation with regular cycle: Secondary | ICD-10-CM | POA: Insufficient documentation

## 2015-02-07 HISTORY — PX: HYSTEROSCOPY WITH D & C: SHX1775

## 2015-02-07 HISTORY — PX: HYSTEROSCOPY WITH NOVASURE: SHX5574

## 2015-02-07 LAB — POCT I-STAT 3, (NA,K, HGB,HCT)
HCT: 37
HGB: 12.6
Potassium: 3.2 mmol/L
Sodium: 139

## 2015-02-07 LAB — POCT PREGNANCY, URINE: Preg Test, Ur: NEGATIVE

## 2015-02-07 SURGERY — HYSTEROSCOPY WITH NOVASURE
Anesthesia: General

## 2015-02-07 MED ORDER — FENTANYL CITRATE (PF) 100 MCG/2ML IJ SOLN
INTRAMUSCULAR | Status: DC | PRN
  Administered 2015-02-07: 50 ug via INTRAVENOUS

## 2015-02-07 MED ORDER — SUCCINYLCHOLINE CHLORIDE 20 MG/ML IJ SOLN
INTRAMUSCULAR | Status: DC | PRN
  Administered 2015-02-07: 100 mg via INTRAVENOUS

## 2015-02-07 MED ORDER — MIDAZOLAM HCL 2 MG/2ML IJ SOLN
INTRAMUSCULAR | Status: DC | PRN
  Administered 2015-02-07: 2 mg via INTRAVENOUS

## 2015-02-07 MED ORDER — FAMOTIDINE 20 MG PO TABS
ORAL_TABLET | ORAL | Status: AC
  Administered 2015-02-07: 20 mg via ORAL
  Filled 2015-02-07: qty 1

## 2015-02-07 MED ORDER — PROPOFOL 10 MG/ML IV BOLUS
INTRAVENOUS | Status: DC | PRN
Start: 2015-02-07 — End: 2015-02-07
  Administered 2015-02-07: 200 mg via INTRAVENOUS

## 2015-02-07 MED ORDER — IBUPROFEN 600 MG PO TABS: 600.0000 mg | ORAL_TABLET | Freq: Four times a day (QID) | ORAL | Status: DC | PRN

## 2015-02-07 MED ORDER — LACTATED RINGERS IV SOLN
INTRAVENOUS | Status: DC
  Administered 2015-02-07 (×2): via INTRAVENOUS

## 2015-02-07 MED ORDER — ACETAMINOPHEN 10 MG/ML IV SOLN
INTRAVENOUS | Status: AC
  Filled 2015-02-07: qty 100

## 2015-02-07 MED ORDER — FENTANYL CITRATE (PF) 100 MCG/2ML IJ SOLN
25.0000 ug | INTRAMUSCULAR | Status: DC | PRN
  Administered 2015-02-07 (×4): 25 ug via INTRAVENOUS

## 2015-02-07 MED ORDER — HYDROCODONE-ACETAMINOPHEN 5-325 MG PO TABS: 1.0000 | ORAL_TABLET | Freq: Four times a day (QID) | ORAL | Status: DC | PRN

## 2015-02-07 MED ORDER — ACETAMINOPHEN 10 MG/ML IV SOLN
INTRAVENOUS | Status: DC | PRN
  Administered 2015-02-07: 1000 mg via INTRAVENOUS

## 2015-02-07 MED ORDER — ONDANSETRON HCL 4 MG/2ML IJ SOLN: 4.0000 mg | Freq: Once | INTRAMUSCULAR | Status: DC | PRN

## 2015-02-07 MED ORDER — FAMOTIDINE 20 MG PO TABS
20.0000 mg | ORAL_TABLET | Freq: Once | ORAL | Status: AC
  Administered 2015-02-07: 20 mg via ORAL

## 2015-02-07 MED ORDER — FENTANYL CITRATE (PF) 100 MCG/2ML IJ SOLN
INTRAMUSCULAR | Status: AC
  Administered 2015-02-07: 25 ug via INTRAVENOUS
  Filled 2015-02-07: qty 2

## 2015-02-07 SURGICAL SUPPLY — 21 items
CANISTER SUCT 3000ML (MISCELLANEOUS) ×2 IMPLANT
CATH ROBINSON RED A/P 16FR (CATHETERS) ×2 IMPLANT
ELECT RESECT POWERBALL 24F (MISCELLANEOUS) IMPLANT
GLOVE BIO SURGEON STRL SZ7 (GLOVE) ×4 IMPLANT
GLOVE BIOGEL PI IND STRL 7.5 (GLOVE) ×2 IMPLANT
GLOVE BIOGEL PI INDICATOR 7.5 (GLOVE) ×2
GLOVE SURG LX 7.5 STRW (GLOVE) ×2
GLOVE SURG LX STRL 7.5 STRW (GLOVE) ×2 IMPLANT
GOWN STRL REUS W/ TWL LRG LVL3 (GOWN DISPOSABLE) ×2 IMPLANT
GOWN STRL REUS W/TWL LRG LVL3 (GOWN DISPOSABLE) ×2
IV LACTATED RINGERS 1000ML (IV SOLUTION) ×2 IMPLANT
KIT RM TURNOVER CYSTO AR (KITS) ×2 IMPLANT
LOOP CUT RT ANGL 28F (MISCELLANEOUS) IMPLANT
NOVASURE ENDOMETRIAL ABLATION (MISCELLANEOUS) ×2 IMPLANT
NS IRRIG 500ML POUR BTL (IV SOLUTION) ×2 IMPLANT
PACK DNC HYST (MISCELLANEOUS) ×2 IMPLANT
PAD GROUND ADULT SPLIT (MISCELLANEOUS) ×2 IMPLANT
PAD OB MATERNITY 4.3X12.25 (PERSONAL CARE ITEMS) ×2 IMPLANT
PAD PREP 24X41 OB/GYN DISP (PERSONAL CARE ITEMS) ×2 IMPLANT
TOWEL OR 17X26 4PK STRL BLUE (TOWEL DISPOSABLE) ×2 IMPLANT
TUBING CONNECTING 10 (TUBING) ×2 IMPLANT

## 2015-02-07 NOTE — Anesthesia Preprocedure Evaluation (Signed)
Anesthesia Evaluation  Patient identified by MRN, date of birth, ID band Patient awake    Reviewed: Allergy & Precautions, NPO status , Patient's Chart, lab work & pertinent test results  Airway Mallampati: II  TM Distance: >3 FB Neck ROM: Full    Dental  (+) Caps, Chipped   Pulmonary shortness of breath and with exertion,  breath sounds clear to auscultation  Pulmonary exam normal       Cardiovascular hypertension, + Peripheral Vascular Disease Normal cardiovascular exam    Neuro/Psych  Headaches, Anxiety Depression Carpal tunnel  Neuromuscular disease    GI/Hepatic GERD-  Medicated and Controlled,  Endo/Other    Renal/GU Kidney stone  negative genitourinary   Musculoskeletal  (+) Arthritis -, Osteoarthritis,    Abdominal Normal abdominal exam  (+)   Peds negative pediatric ROS (+)  Hematology negative hematology ROS (+)   Anesthesia Other Findings   Reproductive/Obstetrics negative OB ROS                             Anesthesia Physical Anesthesia Plan  ASA: III  Anesthesia Plan: General   Post-op Pain Management:    Induction: Intravenous and Rapid sequence  Airway Management Planned: Oral ETT  Additional Equipment:   Intra-op Plan:   Post-operative Plan: Extubation in OR  Informed Consent: I have reviewed the patients History and Physical, chart, labs and discussed the procedure including the risks, benefits and alternatives for the proposed anesthesia with the patient or authorized representative who has indicated his/her understanding and acceptance.   Dental advisory given  Plan Discussed with: CRNA and Surgeon  Anesthesia Plan Comments:         Anesthesia Quick Evaluation

## 2015-02-07 NOTE — Op Note (Signed)
Operative Note   02/07/2015  PRE-OP DIAGNOSIS: menorrhagia   POST-OP DIAGNOSIS: menorrhagia  SURGEON: Surgeon(s) and Role:    * Will Bonnet, MD - Primary  PROCEDURE: Procedure(s): 1) HYSTEROSCOPY WITH NOVASURE 2) DILATATION AND CURETTAGE /HYSTEROSCOPY   ANESTHESIA: General ET  ESTIMATED BLOOD LOSS: 5 mL  DRAINS: None   TOTAL IV FLUIDS: 700 mL  SPECIMENS:  Endometrial curettings  VTE PROPHYLAXIS: SCDs to the bilateral lower extremities  ANTIBIOTICS: None indicated  COMPLICATIONS: None  DISPOSITION: PACU - hemodynamically stable.  CONDITION: stable  FINDINGS: Exam under anesthesia revealed small, mobile mid-plane uterus with no masses and bilateral adnexa without masses or fullness. Hysteroscopy revealed a grossly normal appearing uterine cavity with bilateral tubal ostia and normal appearing endocervical canal.  During procedure a small defect was created in the fundal region that did not completely traverse the myometrium.   PROCEDURE IN DETAIL:  After informed consent was obtained, the patient was taken to the operating room where anesthesia was obtained without difficulty. The patient was positioned in the dorsal lithotomy position in candy cane stirrups. The patient's bladder was catheterized with an in-and-out catheter.  The patient was examined under anesthesia, with the above noted findings.  The bi-valved speculum was placed inside the patient's vagina, and the the anterior lip of the cervix was seen and grasped with the tenaculum.  The uterine cavity was sounded to 9 cm, and then the cervix was progressively dilated to 29mm with a Hegar dilator.  The hysteroscope was introduced, with the above noted findings. The hystersocope was removed and the uterine cavity was curetted.   Cavity assessment revealed measurements of 5 cm for cervical length 9 cm for a total length yielding a cavity depth of 4 centimeters. Cavity width assessment was 3.5 cm.  The MyoSure device was  introduced and cavity assessment was performed and passed. Note that prior to cavity width assessment the device could not fully be deployed so was removed. The hysteroscope was reintroduced with the above-noted defect in the fundal region. The MyoSure device was again gently reintroduced into the cervix and opened prior to reaching the fundus. The width was then able to be correctly assessed. The MyoSure device was then deployed for a total of 100 seconds.  The MyoSure  device was removed and the hysteroscope was reintroduced. It was noted that a global ablation had occurred without inclusion of the defect in the fundus. Again the fundal defect was examined and was found to not completely traverse the wall of the uterus. The hysteroscope was removed. The tenaculum was removed and hemostasis was noted. All instruments were removed, with excellent hemostasis noted throughout.  She was then taken out of dorsal lithotomy.  The patient tolerated the procedure well.  Sponge, lap and needle counts were correct x2.  The patient was taken to recovery room in excellent condition.  Will Bonnet, MD, [employer] 02/07/2015 11:15 AM

## 2015-02-07 NOTE — H&P (Signed)
History and Physical Interval Note:  Monique Daniels  has presented today for surgery, with the diagnosis of MENORRHAGIA  The various methods of treatment have been discussed with the patient and family. After consideration of risks, benefits and other options for treatment, the patient has consented to  Procedure(s): HYSTEROSCOPY WITH NOVASURE (N/A) DILATATION AND CURETTAGE /HYSTEROSCOPY (N/A) as a surgical intervention .  The patient's history has been reviewed, patient examined, no change in status, stable for surgery.  I have reviewed the patient's chart and labs.  Questions were answered to the patient's satisfaction.    The patient does not take a beta blocker and one is not indicated for this surgery.  Will Bonnet, MD, [place] 02/07/2015 9:46 AM

## 2015-02-07 NOTE — Transfer of Care (Signed)
Immediate Anesthesia Transfer of Care Note  Patient: Monique Daniels  Procedure(s) Performed: Procedure(s): HYSTEROSCOPY WITH NOVASURE (N/A) DILATATION AND CURETTAGE /HYSTEROSCOPY (N/A)  Patient Location: PACU  Anesthesia Type:General  Level of Consciousness: sedated and unresponsive  Airway & Oxygen Therapy: Patient Spontanous Breathing and Patient connected to face mask oxygen  Post-op Assessment: Report given to RN and Post -op Vital signs reviewed and stable  Post vital signs: Reviewed and stable  Last Vitals:  Filed Vitals:   02/07/15 1119  BP: 142/86  Pulse: 90  Temp: 36.8 C  Resp: 14    Complications: No apparent anesthesia complications

## 2015-02-07 NOTE — Anesthesia Procedure Notes (Signed)
Procedure Name: Intubation Date/Time: 02/07/2015 10:20 AM Performed by: Jonna Clark Pre-anesthesia Checklist: Patient identified, Patient being monitored, Timeout performed, Emergency Drugs available and Suction available Patient Re-evaluated:Patient Re-evaluated prior to inductionOxygen Delivery Method: Circle system utilized Preoxygenation: Pre-oxygenation with 100% oxygen Intubation Type: IV induction and Rapid sequence Ventilation: Mask ventilation without difficulty Laryngoscope Size: Mac and 3 Grade View: Grade I Tube type: Oral Tube size: 7.0 mm Number of attempts: 1 Placement Confirmation: ETT inserted through vocal cords under direct vision,  positive ETCO2 and breath sounds checked- equal and bilateral Secured at: 21 cm Tube secured with: Tape Dental Injury: Teeth and Oropharynx as per pre-operative assessment

## 2015-02-07 NOTE — Discharge Instructions (Signed)

## 2015-02-07 NOTE — Anesthesia Postprocedure Evaluation (Signed)
  Anesthesia Post-op Note  Patient: Monique Daniels  Procedure(s) Performed: Procedure(s): HYSTEROSCOPY WITH NOVASURE (N/A) DILATATION AND CURETTAGE /HYSTEROSCOPY (N/A)  Anesthesia type:General  Patient location: PACU  Post pain: Pain level controlled  Post assessment: Post-op Vital signs reviewed, Patient's Cardiovascular Status Stable, Respiratory Function Stable, Patent Airway and No signs of Nausea or vomiting  Post vital signs: Reviewed and stable  Last Vitals:  Filed Vitals:   02/07/15 1226  BP: 135/70  Pulse: 74  Temp:   Resp: 16    Level of consciousness: awake, alert  and patient cooperative  Complications: No apparent anesthesia complications

## 2015-02-08 LAB — SURGICAL PATHOLOGY

## 2015-04-07 ENCOUNTER — Other Ambulatory Visit: Payer: Self-pay | Admitting: Family Medicine

## 2015-04-28 ENCOUNTER — Other Ambulatory Visit: Payer: Self-pay | Admitting: Family Medicine

## 2015-04-29 ENCOUNTER — Encounter: Payer: Self-pay | Admitting: Family Medicine

## 2015-04-29 ENCOUNTER — Ambulatory Visit (INDEPENDENT_AMBULATORY_CARE_PROVIDER_SITE_OTHER): Payer: BLUE CROSS/BLUE SHIELD | Admitting: Family Medicine

## 2015-04-29 VITALS — BP 122/80 | HR 80 | Temp 97.9°F | Resp 18 | Ht 62.0 in | Wt 218.8 lb

## 2015-04-29 DIAGNOSIS — I10 Essential (primary) hypertension: Secondary | ICD-10-CM

## 2015-04-29 DIAGNOSIS — K219 Gastro-esophageal reflux disease without esophagitis: Secondary | ICD-10-CM | POA: Diagnosis not present

## 2015-04-29 DIAGNOSIS — F418 Other specified anxiety disorders: Secondary | ICD-10-CM | POA: Diagnosis not present

## 2015-04-29 DIAGNOSIS — Z23 Encounter for immunization: Secondary | ICD-10-CM

## 2015-04-29 DIAGNOSIS — J302 Other seasonal allergic rhinitis: Secondary | ICD-10-CM | POA: Diagnosis not present

## 2015-04-29 DIAGNOSIS — F329 Major depressive disorder, single episode, unspecified: Secondary | ICD-10-CM

## 2015-04-29 DIAGNOSIS — G4700 Insomnia, unspecified: Secondary | ICD-10-CM

## 2015-04-29 DIAGNOSIS — E8881 Metabolic syndrome: Secondary | ICD-10-CM

## 2015-04-29 DIAGNOSIS — F419 Anxiety disorder, unspecified: Secondary | ICD-10-CM

## 2015-04-29 DIAGNOSIS — G43409 Hemiplegic migraine, not intractable, without status migrainosus: Secondary | ICD-10-CM

## 2015-04-29 DIAGNOSIS — M5030 Other cervical disc degeneration, unspecified cervical region: Secondary | ICD-10-CM | POA: Diagnosis not present

## 2015-04-29 DIAGNOSIS — F32A Depression, unspecified: Secondary | ICD-10-CM

## 2015-04-29 MED ORDER — TOPIRAMATE 100 MG PO TABS: 100.0000 mg | ORAL_TABLET | Freq: Two times a day (BID) | ORAL | Status: DC

## 2015-04-29 MED ORDER — ALPRAZOLAM 0.5 MG PO TABS: 0.5000 mg | ORAL_TABLET | Freq: Every day | ORAL | Status: DC | PRN

## 2015-04-29 NOTE — Patient Instructions (Signed)
Metabolic Syndrome, Adult Metabolic syndrome describes a group of risk factors for heart disease and diabetes. This syndrome has other names including Insulin Resistance Syndrome. The more risk factors you have, the higher your risk of having a heart attack, stroke, or developing diabetes. These risk factors include:  High blood sugar.  High blood triglyceride (a fat found in the blood) level.  High blood pressure.  Abdominal obesity (your extra weight is around your waist instead of your hips).  Low levels of high-density lipoprotein, HDL (good blood cholesterol). If you have any three of these risk factors, you have metabolic syndrome. If you have even one of these factors, you should make lifestyle changes to improve your health in order to prevent serious health diseases.  In people with metabolic syndrome, the cells do not respond properly to insulin. This can lead to high levels of glucose in the blood, which can interfere with normal body processes. Eventually, this can cause high blood pressure and higher fat levels in the blood, and inflammation of your blood vessels. The result can be heart disease and stroke.  CAUSES   Eating a diet rich in calories and saturated fat.  Too little physical activity.  Being overweight. Other underlying causes are:  Family history (genetics).  Ethnicity (South Asians are at a higher risk).  Older age (your chances of developing metabolic syndrome are higher as you grow older).  Insulin resistance. SYMPTOMS  By itself, metabolic syndrome has no symptoms. However, you might have symptoms of diabetes (high blood sugar) or high blood pressure, such as:  Increased thirst, urination, and tiredness.  Dizzy spells.  Dull headaches that are unusual for you.  Blurred vision.  Nosebleeds. DIAGNOSIS  Your caregiver may make a diagnosis of metabolic syndrome if you have at least three of these factors:  If you are overweight mostly around the  waist. This means a waistline greater than 40" in men and more than 35" in women. The waistline limits are 31 to 35 inches for women and 37 to 39 inches for men. In those who have certain genetic risk factors, such as having a family history of diabetes or being of Asian descent.  If you have a blood pressure of 130/85 mm Hg or more, or if you are being treated for high blood pressure.  If your blood triglyceride level is 150 mg/dL or more, or you are being treated for high levels of triglyceride.  If the level of HDL in your blood is below 40 mg/dL in men, less than 50 mg/dL in women, or you are receiving treatment for low levels of HDL.  If the level of sugar in your blood is high with fasting blood sugar level of 110 mg/dL or more, or you are under treatment for diabetes. TREATMENT  Your caregiver may have you make lifestyle changes, which may include:  Exercise.  Losing weight.  Maintaining a healthy diet.  Quitting smoking. The lifestyle changes listed above are key in reducing your risk for heart disease and stroke. Medicines may also be prescribed to help your body respond to insulin better and to reduce your blood pressure and blood fat levels. Aspirin may be recommended to reduce risks of heart disease or stroke.  HOME CARE INSTRUCTIONS   Exercise.  Measure your waist at regular intervals just above the hipbones after you have breathed out.  Maintain a healthy diet.  Eat fruits, such as apples, oranges, and pears.  Eat vegetables.  Eat legumes, such as kidney   beans, peas, and lentils.  Eat food rich in soluble fiber, such as whole grain cereal, oatmeal, and oat bran.  Use olive or safflower oils and avoid saturated fats.  Eat nuts.  Limit the amount of salt you eat or add to food.  Limit the amount of alcohol you drink.  Include fish in your diet, if possible.  Stop smoking if you are a smoker.  Maintain regular follow-up appointments.  Follow your  caregiver's advice. SEEK MEDICAL CARE IF:   You feel very tired or fatigued.  You develop excessive thirst.  You pass large quantities of urine.  You are putting on weight around your waist rather than losing weight.  You develop headaches over and over again.  You have off-and-on dizzy spells. SEEK IMMEDIATE MEDICAL CARE IF:   You develop nosebleeds.  You develop sudden blurred vision.  You develop sudden dizzy spells.  You develop chest pains, trouble breathing, or feel an abnormal or irregular heart beat.  You have a fainting episode.  You develop any sudden trouble speaking and/or swallowing.  You develop sudden weakness in one arm and/or one leg. MAKE SURE YOU:   Understand these instructions.  Will watch your condition.  Will get help right away if you are not doing well or get worse. Document Released: 10/27/2007 Document Revised: 12/04/2013 Document Reviewed: 10/27/2007 ExitCare Patient Information 2015 [employer]. This information is not intended to replace advice given to you by your health care provider. Make sure you discuss any questions you have with your health care provider.  

## 2015-04-29 NOTE — Progress Notes (Signed)
**Note De-Identified Torence Palmeri Obfuscation** Name: Monique Daniels   MRN: [id number]    DOB: 1961-02-10   Date:04/29/2015       Progress Note  Subjective  Chief Complaint  Chief Complaint  Patient presents with  . Medication Refill    3 month F/U   . Allergic Rhinitis     Started this morning having scratchy throat, dry coughing. Patient is taking Cough Drops to help.    Marland Kitchen Hypertension    Well Controlled, drinking lots of water to help.   . Gastrophageal Reflux    Well controlled with taking medication regularly.   . Migraine    Well controlled with taking combination of medication daily.    HPI   HTN: taking medication daily, denies side effects. No chest pain, or palpitation  AR: seasonal, started with rhinorrhea and a dry cough today, no SOB , no wheezing, no itchy eyes, she will resume otc Allegra  GERD: under control with medication, no heartburn, no regurgitation.   Migraine: hemiplegic, she ran out of Topamax, back on Pamelor at night, symptoms are not as frequent now. She has a lot of nuchal pain secondary to DDD cervical spine. She did well on Topamax, and would like to resume medication  Metabolic Syndrome: discussed life style modification, she has htn, increase in abdominal girth and low HDL.   Depression/Anxiety: doing well at this time, losing weight, with life style modification and is feeling better emotionally. Less stress at home.    Patient Active Problem List   Diagnosis Date Noted  . History of esophageal stricture 01/28/2015  . Atypical nevus 01/27/2015  . Carpal tunnel syndrome 01/27/2015  . Insomnia, persistent 01/27/2015  . DDD (degenerative disc disease), cervical 01/27/2015  . Anxiety and depression 01/27/2015  . Gastric reflux 01/27/2015  . Headache, hemiplegic migraine 01/27/2015  . H/O transient cerebral ischemia 01/27/2015  . Hypertension, benign 01/27/2015  . Calculus of kidney 01/27/2015  . Adult BMI 30+ 01/27/2015  . Vitamin D deficiency 01/27/2015  . Snores 01/27/2015  .  Aneurysm, cerebral, nonruptured 05/12/2013  . Palpitations 07/15/2012    Past Surgical History  Procedure Laterality Date  . Tubal ligation  1989  . Tooth extraction    . Hysteroscopy with novasure N/A 02/07/2015    Procedure: HYSTEROSCOPY WITH NOVASURE;  Surgeon: Will Bonnet, MD;  Location: [facility] ORS;  Service: Gynecology;  Laterality: N/A;  . Hysteroscopy w/d&c N/A 02/07/2015    Procedure: DILATATION AND CURETTAGE /HYSTEROSCOPY;  Surgeon: Will Bonnet, MD;  Location: [facility] ORS;  Service: Gynecology;  Laterality: N/A;    Family History  Problem Relation Age of Onset  . Asthma Mother   . Heart disease Mother   . Hyperlipidemia Mother   . Asthma Son   . Heart attack Father   . Emphysema Mother   . Urolithiasis Son   . Tremor Mother   . Tremor Sister   . Tremor Brother   . Macular degeneration Mother   . Macular degeneration Maternal Grandmother     Social History   Social History  . Marital Status: Married    Spouse Name: Merry Proud  . Number of Children: 2  . Years of Education: 12th   Occupational History  . Medical records    Social History Main Topics  . Smoking status: Never Smoker   . Smokeless tobacco: Never Used  . Alcohol Use: No  . Drug Use: No  . Sexual Activity:    Partners: Male     Comment: BTL **Note De-Identified Shaunae Sieloff Obfuscation** Other Topics Concern  . Not on file   Social History Narrative   Patient lives at home with husband Merry Proud.    Patient works at [employer].    Patient has a high school education.    Patient has 2 children.      Current outpatient prescriptions:  .  ALPRAZolam (XANAX) 0.5 MG tablet, Take 1 tablet (0.5 mg total) by mouth daily as needed for anxiety (05-1 tablet)., Disp: 30 tablet, Rfl: 0 .  amLODipine (NORVASC) 5 MG tablet, Take 1 tablet (5 mg total) by mouth daily. (Patient taking differently: Take 5 mg by mouth every morning. Pt preference), Disp: 90 tablet, Rfl: 1 .  aspirin-acetaminophen-caffeine (EXCEDRIN MIGRAINE) 726-203-55 MG per  tablet, Take 2 tablets by mouth every 6 (six) hours as needed for pain., Disp: , Rfl:  .  Biotin (BIOTIN 5000) 5 MG CAPS, Take 1 tablet by mouth every morning. , Disp: , Rfl:  .  cetirizine (ZYRTEC) 10 MG tablet, Take 1 tablet by mouth as needed., Disp: , Rfl:  .  Cholecalciferol (VITAMIN D) 2000 UNITS tablet, Take 2,000 Units by mouth. 3-4 times a week for energy., Disp: , Rfl:  .  cyclobenzaprine (FLEXERIL) 10 MG tablet, Take 1 tablet (10 mg total) by mouth at bedtime. (Patient taking differently: Take 10 mg by mouth at bedtime as needed. ), Disp: 90 tablet, Rfl: 0 .  hydrochlorothiazide (HYDRODIURIL) 25 MG tablet, Take 1 tablet (25 mg total) by mouth 2 (two) times daily., Disp: 180 tablet, Rfl: 1 .  ibuprofen (ADVIL,MOTRIN) 600 MG tablet, Take 1 tablet (600 mg total) by mouth every 6 (six) hours as needed for mild pain or cramping., Disp: 30 tablet, Rfl: 0 .  omeprazole (PRILOSEC) 20 MG capsule, Take 1 capsule (20 mg total) by mouth 2 (two) times daily., Disp: 180 capsule, Rfl: 1 .  sodium chloride (OCEAN) 0.65 % nasal spray, Place 1 spray into both nostrils daily as needed (for allergies). , Disp: , Rfl:  .  topiramate (TOPAMAX) 100 MG tablet, Take 1 tablet (100 mg total) by mouth 2 (two) times daily., Disp: 180 tablet, Rfl: 1 .  traZODone (DESYREL) 50 MG tablet, Take 1 tablet (50 mg total) by mouth at bedtime., Disp: 90 tablet, Rfl: 1  Allergies  Allergen Reactions  . Codeine Other (See Comments)    Severe headache   . Claritin  [Loratadine] Other (See Comments)  . Entex Lq  [Phenylephrine-Guaifenesin] Other (See Comments)  . Fexofenadine Other (See Comments)  . Loratadine-Pseudoephedrine Er Other (See Comments)  . Pseudoephedrine Other (See Comments)  . Tramadol Nausea Only     ROS  Constitutional: Negative for fever but has weight change.  Respiratory: Positive  for cough no  shortness of breath.   Cardiovascular: Negative for chest pain or palpitations.  Gastrointestinal:  Negative for abdominal pain, no bowel changes.  Musculoskeletal: Negative for gait problem or joint swelling.  Skin: Negative for rash.  Neurological: Negative for dizziness positive for  headache.  No other specific complaints in a complete review of systems (except as listed in HPI above).  Objective  Filed Vitals:   04/29/15 1610  BP: 122/80  Pulse: 80  Temp: 97.9 F (36.6 C)  TempSrc: Oral  Resp: 18  Height: _0  (1.575 m)  Weight: 218 lb 12.8 oz (99.247 kg)  SpO2: 96%    Body mass index is 40.01 kg/(m^2).  Physical Exam  Constitutional: Patient appears well-developed and well-nourished. ObeseNo distress.  HEENT: head atraumatic, **Note De-Identified Abran Gavigan Obfuscation** normocephalic, pupils equal and reactive to light, ears normal TM, neck supple, throat within normal limits Cardiovascular: Normal rate, regular rhythm and normal heart sounds.  No murmur heard. BLE edema 1plus. Pulmonary/Chest: Effort normal and breath sounds normal. No respiratory distress. Abdominal: Soft.  There is no tenderness. Psychiatric: Patient has a normal mood and affect. behavior is normal. Judgment and thought content normal. Neck: normal ROM, but tender to touch posteriorly  Recent Results (from the past 2160 hour(s))  CBC     Status: None   Collection Time: 01/30/15  1:51 PM  Result Value Ref Range   WBC 9.1 3.6 - 11.0 K/uL   RBC 5.06 3.80 - 5.20 MIL/uL   Hemoglobin 13.8 12.0 - 16.0 g/dL   HCT 41.9 35.0 - 47.0 %   MCV 82.7 80.0 - 100.0 fL   MCH 27.3 26.0 - 34.0 pg   MCHC 33.1 32.0 - 36.0 g/dL   RDW 14.0 11.5 - 14.5 %   Platelets 274 150 - 440 K/uL  Comprehensive metabolic panel     Status: Abnormal   Collection Time: 01/30/15  1:51 PM  Result Value Ref Range   Sodium 139 135 - 145 mmol/L   Potassium 3.2 (L) 3.5 - 5.1 mmol/L   Chloride 103 101 - 111 mmol/L   CO2 28 22 - 32 mmol/L   Glucose, Bld 94 65 - 99 mg/dL   BUN 11 6 - 20 mg/dL   Creatinine, Ser 0.75 0.44 - 1.00 mg/dL   Calcium 9.2 8.9 - 10.3 mg/dL   Total  Protein 7.3 6.5 - 8.1 g/dL   Albumin 3.8 3.5 - 5.0 g/dL   AST 20 15 - 41 U/L   ALT 20 14 - 54 U/L   Alkaline Phosphatase 58 38 - 126 U/L   Total Bilirubin 0.6 0.3 - 1.2 mg/dL   GFR calc non Af Amer >60 >60 mL/min   GFR calc Af Amer >60 >60 mL/min    Comment: (NOTE) The eGFR has been calculated using the CKD EPI equation. This calculation has not been validated in all clinical situations. eGFR's persistently <60 mL/min signify possible Chronic Kidney Disease.    Anion gap 8 5 - 15  Type and screen     Status: None   Collection Time: 01/30/15  1:51 PM  Result Value Ref Range   ABO/RH(D) O POS    Antibody Screen NEG    Sample Expiration 02/13/2015   ABO/Rh     Status: None   Collection Time: 01/30/15  1:52 PM  Result Value Ref Range   ABO/RH(D) O POS   Pregnancy, urine POC     Status: None   Collection Time: 02/07/15  8:15 AM  Result Value Ref Range   Preg Test, Ur NEGATIVE NEGATIVE    Comment:        THE SENSITIVITY OF THIS METHODOLOGY IS >24 mIU/mL   I-STAT 3, (NA,K, HGB,HCT)     Status: None   Collection Time: 02/07/15  9:05 AM  Result Value Ref Range   Potassium 3.2 mmol/L   Sodium 139    HCT 37    HGB 12.6   Surgical pathology     Status: None   Collection Time: 02/07/15 10:53 AM  Result Value Ref Range   SURGICAL PATHOLOGY      Surgical Pathology CASE: [id number] PATIENT: [facility]     SPECIMEN SUBMITTED: A. Endometrial currettings  CLINICAL HISTORY: None provided  PRE-OPERATIVE DIAGNOSIS: Menorrhagia  POST-OPERATIVE **Note De-Identified Latressa Harries Obfuscation** DIAGNOSIS: None provided     DIAGNOSIS: A. ENDOMETRIUM; CURETTAGE: - FRAGMENTS OF PROLIFERATIVE ENDOMETRIUM WITH STROMAL BREAKDOWN CHANGES. - NEGATIVE FOR HYPERPLASIA AND CARCINOMA.   GROSS DESCRIPTION:  A. Labeled: endometrial curetting Tissue Fragment(s): multiple Measurement: aggregate, 1.7 x 1.5 x 0.1 cm Comment: blood clot and pink fragment  Entirely submitted in cassette(s):  1   Final Diagnosis performed by Delorse Lek, MD.  Electronically signed 02/08/2015 12:37:51PM    The electronic signature indicates that the named Attending Pathologist has evaluated the specimen  Technical component performed at [facility], [address] [place], [place] [address] Lab: [phone] Dir: Darrick Penna. Evette Doffing, MD  Professional c omponent performed at [facility], [place], [place], [place] [address] Lab: [phone] Dir: Dellia Nims. Reuel Derby, MD      PHQ2/9: Depression screen [facility] 2/9 04/29/2015 01/28/2015  Decreased Interest 0 0  Down, Depressed, Hopeless 0 0  PHQ - 2 Score 0 0     Fall Risk: Fall Risk  04/29/2015 01/28/2015  Falls in the past year? No No     Functional Status Survey: Is the patient deaf or have difficulty hearing?: No Does the patient have difficulty seeing, even when wearing glasses/contacts?: Yes (glasses) Does the patient have difficulty concentrating, remembering, or making decisions?: No Does the patient have difficulty walking or climbing stairs?: No Does the patient have difficulty dressing or bathing?: No Does the patient have difficulty doing errands alone such as visiting a doctor's office or shopping?: No   Assessment & Plan  1. Hypertension, benign   doing well, continue medication   2. Needs flu shot  - Flu Vaccine QUAD 36+ mos PF IM (Fluarix & Fluzone Quad PF)  3. Gastric reflux  Under control   4. Insomnia, persistent  Taking medication prn now, still has hot flashes, tosses and turns all night but does not like taking pills daily   5. Hemiplegic migraine without status migrainosus, not intractable   resume Topamax  - topiramate (TOPAMAX) 100 MG tablet; Take 1 tablet (100 mg total) by mouth 2 (two) times daily.  Dispense: 180 tablet; Refill: 1  6. DDD (degenerative disc disease), cervical   She takes Cyclobenzaprine and it helps with symtpoms  7. Anxiety and depression   she  refuses to take SSRI's - has a controlling husband, but denies physical abuse  - ALPRAZolam (XANAX) 0.5 MG tablet; Take 1 tablet (0.5 mg total) by mouth daily as needed for anxiety (05-1 tablet).  Dispense: 30 tablet; Refill: 0  8. Metabolic syndrome   Discussed diet and exercise

## 2015-06-06 ENCOUNTER — Encounter: Payer: Self-pay | Admitting: Family Medicine

## 2015-06-06 ENCOUNTER — Ambulatory Visit (INDEPENDENT_AMBULATORY_CARE_PROVIDER_SITE_OTHER): Payer: BLUE CROSS/BLUE SHIELD | Admitting: Family Medicine

## 2015-06-06 DIAGNOSIS — J069 Acute upper respiratory infection, unspecified: Secondary | ICD-10-CM | POA: Insufficient documentation

## 2015-06-06 DIAGNOSIS — B9789 Other viral agents as the cause of diseases classified elsewhere: Principal | ICD-10-CM

## 2015-06-06 DIAGNOSIS — R058 Other specified cough: Secondary | ICD-10-CM | POA: Insufficient documentation

## 2015-06-06 DIAGNOSIS — R05 Cough: Secondary | ICD-10-CM | POA: Insufficient documentation

## 2015-06-06 LAB — POCT INFLUENZA A/B
Influenza A, POC: NEGATIVE
Influenza B, POC: NEGATIVE

## 2015-06-06 NOTE — Progress Notes (Signed)
Name: Monique Daniels   MRN: [id number]    DOB: 1961/02/09   Date:06/06/2015       Progress Note  Subjective  Chief Complaint  Chief Complaint  Patient presents with  . Cough    Yellow Phlem  . Sore Throat    x2 days   Sore Throat  This is a new problem. The current episode started yesterday. There has been no fever. Associated symptoms include coughing and shortness of breath. Pertinent negatives include no diarrhea, headaches or swollen glands. She has had no exposure to strep or mono.    Past Medical History  Diagnosis Date  . Migraine, unspecified, without mention of intractable migraine without mention of status migrainosus   . Other dyspnea and respiratory abnormality   . Unspecified vitamin D deficiency   . Enthesopathy of hip region   . Other malaise and fatigue   . Allergic rhinitis, cause unspecified   . Irregular menstrual cycle   . Esophageal reflux   . Benign neoplasm of skin, site unspecified   . Carpal tunnel syndrome   . Symptomatic menopausal or female climacteric states   . Obesity, unspecified   . Transient ischemic attack (TIA), and cerebral infarction without residual deficits   . Palpitations   . Essential hypertension, malignant   . Dizziness and giddiness   . Ovarian cyst   . Calculus of kidney     stones  . Degeneration of cervical intervertebral disc     Past Surgical History  Procedure Laterality Date  . Tubal ligation  1989  . Tooth extraction    . Hysteroscopy with novasure N/A 02/07/2015    Procedure: HYSTEROSCOPY WITH NOVASURE;  Surgeon: Will Bonnet, MD;  Location: [facility];  Service: Gynecology;  Laterality: N/A;  . Hysteroscopy w/d&c N/A 02/07/2015    Procedure: DILATATION AND CURETTAGE /HYSTEROSCOPY;  Surgeon: Will Bonnet, MD;  Location: [facility];  Service: Gynecology;  Laterality: N/A;    Family History  Problem Relation Age of Onset  . Asthma Mother   . Heart disease Mother   . Hyperlipidemia Mother   . Asthma Son    . Heart attack Father   . Emphysema Mother   . Urolithiasis Son   . Tremor Mother   . Tremor Sister   . Tremor Brother   . Macular degeneration Mother   . Macular degeneration Maternal Grandmother     Social History   Social History  . Marital Status: Married    Spouse Name: Merry Proud  . Number of Children: 2  . Years of Education: 12th   Occupational History  . Medical records    Social History Main Topics  . Smoking status: Never Smoker   . Smokeless tobacco: Never Used  . Alcohol Use: No  . Drug Use: No  . Sexual Activity:    Partners: Male     Comment: BTL   Other Topics Concern  . Not on file   Social History Narrative   Patient lives at home with husband Merry Proud.    Patient works at [employer].    Patient has a high school education.    Patient has 2 children.     Current outpatient prescriptions:  .  ALPRAZolam (XANAX) 0.5 MG tablet, Take 1 tablet (0.5 mg total) by mouth daily as needed for anxiety (05-1 tablet)., Disp: 30 tablet, Rfl: 0 .  amLODipine (NORVASC) 5 MG tablet, Take 1 tablet (5 mg total) by mouth daily. (Patient taking differently: Take 5  mg by mouth every morning. Pt preference), Disp: 90 tablet, Rfl: 1 .  aspirin-acetaminophen-caffeine (EXCEDRIN MIGRAINE) 768-115-72 MG per tablet, Take 2 tablets by mouth every 6 (six) hours as needed for pain., Disp: , Rfl:  .  cyclobenzaprine (FLEXERIL) 10 MG tablet, Take 1 tablet (10 mg total) by mouth at bedtime. (Patient taking differently: Take 10 mg by mouth at bedtime as needed. ), Disp: 90 tablet, Rfl: 0 .  hydrochlorothiazide (HYDRODIURIL) 25 MG tablet, Take 1 tablet (25 mg total) by mouth 2 (two) times daily., Disp: 180 tablet, Rfl: 1 .  ibuprofen (ADVIL,MOTRIN) 600 MG tablet, Take 1 tablet (600 mg total) by mouth every 6 (six) hours as needed for mild pain or cramping., Disp: 30 tablet, Rfl: 0 .  nortriptyline (PAMELOR) 10 MG capsule, TAKE ONE CAPSULE BY MOUTH EVERY EVENING FOR MIGRAINE PREVENTION,  Disp: , Rfl: 2 .  omeprazole (PRILOSEC) 20 MG capsule, Take 1 capsule (20 mg total) by mouth 2 (two) times daily., Disp: 180 capsule, Rfl: 1 .  sodium chloride (OCEAN) 0.65 % nasal spray, Place 1 spray into both nostrils daily as needed (for allergies). , Disp: , Rfl:  .  traZODone (DESYREL) 50 MG tablet, Take 1 tablet (50 mg total) by mouth at bedtime., Disp: 90 tablet, Rfl: 1  Allergies  Allergen Reactions  . Codeine Other (See Comments)    Severe headache   . Claritin  [Loratadine] Other (See Comments)  . Entex Lq  [Phenylephrine-Guaifenesin] Other (See Comments)  . Fexofenadine Other (See Comments)  . Loratadine-Pseudoephedrine Er Other (See Comments)  . Pseudoephedrine Other (See Comments)  . Tramadol Nausea Only    Review of Systems  Constitutional: Positive for chills. Negative for fever.  HENT: Positive for sore throat.   Respiratory: Positive for cough, sputum production (yellowish mucus) and shortness of breath.   Cardiovascular: Positive for chest pain (chest hurts when she coughs).  Gastrointestinal: Negative for diarrhea.  Neurological: Negative for headaches.   Objective  Filed Vitals:   06/06/15 1518  BP: 118/75  Pulse: 95  Temp: 98.7 F (37.1 C)  TempSrc: Oral  Resp: 19  Height: 5\' 2"  (1.575 m)  Weight: 212 lb 1.6 oz (96.208 kg)  SpO2: 96%    Physical Exam  Constitutional: She is oriented to person, place, and time and well-developed, well-nourished, and in no distress.  HENT:  Right Ear: Tympanic membrane and ear canal normal.  Left Ear: Tympanic membrane and ear canal normal.  Mouth/Throat: Posterior oropharyngeal erythema present. No oropharyngeal exudate or posterior oropharyngeal edema.  Mild pharyngeal erythema  Neck: Neck supple.  Cardiovascular: Normal rate, regular rhythm and normal heart sounds.   No murmur heard. Pulmonary/Chest: Effort normal and breath sounds normal. She has no wheezes.  Neurological: She is alert and oriented to person,  place, and time.  Nursing note and vitals reviewed.    Assessment & Plan  1. Viral URI with cough Flu a and B testing is negative. Likely viral URI. Advised on conservative measures including hydration, OTC antitussives. RTC if no clinical improvement within 5 days. - Influenza a and b - POCT Influenza A/B    Norie Latendresse [facility] 06/06/2015 3:39 PM

## 2015-07-19 ENCOUNTER — Encounter: Payer: BLUE CROSS/BLUE SHIELD | Admitting: Family Medicine

## 2015-07-30 ENCOUNTER — Ambulatory Visit: Payer: BLUE CROSS/BLUE SHIELD | Admitting: Family Medicine

## 2015-08-10 ENCOUNTER — Other Ambulatory Visit: Payer: Self-pay | Admitting: Family Medicine

## 2015-09-24 ENCOUNTER — Other Ambulatory Visit: Payer: Self-pay | Admitting: Family Medicine

## 2015-09-24 ENCOUNTER — Telehealth: Payer: Self-pay

## 2015-09-24 DIAGNOSIS — Z1239 Encounter for other screening for malignant neoplasm of breast: Secondary | ICD-10-CM

## 2015-09-24 NOTE — Telephone Encounter (Signed)
Pt has scheduled an appt for her CPE and a FU on her BP. Pt wishes to schedule an appt for her Mammogram as well.

## 2015-09-24 NOTE — Telephone Encounter (Signed)
[employer] faxed [place] a patient highlight form stating the patient has only filled her medication 20% of the time in the previous 6 months-Amlodipine. Spoke with patient and she states it makes her tired, informed patient she would need to come in to discuss this with Dr. Ancil Boozer so she could switch medication. Also Insurance informed us her last breast exam was 08/16/13 and is due for this, please schedule patient a appointment. Thanks

## 2015-09-25 NOTE — Progress Notes (Signed)
Patient notified and will call to schedule appointment.

## 2015-10-03 ENCOUNTER — Encounter: Payer: Self-pay | Admitting: Family Medicine

## 2015-10-03 ENCOUNTER — Ambulatory Visit (INDEPENDENT_AMBULATORY_CARE_PROVIDER_SITE_OTHER): Payer: BLUE CROSS/BLUE SHIELD | Admitting: Family Medicine

## 2015-10-03 VITALS — BP 118/78 | HR 79 | Temp 97.9°F | Resp 14 | Ht 62.0 in | Wt 212.4 lb

## 2015-10-03 DIAGNOSIS — I10 Essential (primary) hypertension: Secondary | ICD-10-CM | POA: Diagnosis not present

## 2015-10-03 DIAGNOSIS — E8881 Metabolic syndrome: Secondary | ICD-10-CM

## 2015-10-03 DIAGNOSIS — L719 Rosacea, unspecified: Secondary | ICD-10-CM | POA: Diagnosis not present

## 2015-10-03 DIAGNOSIS — E785 Hyperlipidemia, unspecified: Secondary | ICD-10-CM

## 2015-10-03 DIAGNOSIS — G43409 Hemiplegic migraine, not intractable, without status migrainosus: Secondary | ICD-10-CM

## 2015-10-03 DIAGNOSIS — E668 Other obesity: Secondary | ICD-10-CM

## 2015-10-03 DIAGNOSIS — E559 Vitamin D deficiency, unspecified: Secondary | ICD-10-CM

## 2015-10-03 DIAGNOSIS — K219 Gastro-esophageal reflux disease without esophagitis: Secondary | ICD-10-CM

## 2015-10-03 DIAGNOSIS — F410 Panic disorder [episodic paroxysmal anxiety] without agoraphobia: Secondary | ICD-10-CM

## 2015-10-03 DIAGNOSIS — G4700 Insomnia, unspecified: Secondary | ICD-10-CM

## 2015-10-03 DIAGNOSIS — IMO0002 Reserved for concepts with insufficient information to code with codable children: Secondary | ICD-10-CM

## 2015-10-03 MED ORDER — METRONIDAZOLE 1 % EX GEL: Freq: Every day | CUTANEOUS | Status: DC

## 2015-10-03 MED ORDER — ALPRAZOLAM 0.5 MG PO TABS: 0.5000 mg | ORAL_TABLET | Freq: Every day | ORAL | Status: DC | PRN

## 2015-10-03 MED ORDER — OMEPRAZOLE 20 MG PO CPDR: 20.0000 mg | DELAYED_RELEASE_CAPSULE | Freq: Every day | ORAL | Status: DC

## 2015-10-03 MED ORDER — NORTRIPTYLINE HCL 10 MG PO CAPS: 10.0000 mg | ORAL_CAPSULE | Freq: Every day | ORAL | Status: DC

## 2015-10-03 MED ORDER — HYDROCHLOROTHIAZIDE 25 MG PO TABS: 25.0000 mg | ORAL_TABLET | Freq: Every day | ORAL | Status: DC

## 2015-10-03 NOTE — Progress Notes (Signed)
Name: Monique Daniels   MRN: [id number]    DOB: [birth year]   Date:10/03/2015       Progress Note  Subjective  Chief Complaint  Chief Complaint  Patient presents with  . Medication Refill    4 month F/U and needing refills.   . Hypertension    Patient states the BP medication was making her feel tired and giving her heart palpations, so she stopped the medication on her own.   . Gastroesophageal Reflux    Well controlled with medication  . Migraine    Well controlled due to medication  . Allergic Rhinitis     Symptoms have been worsen due to weather changes,sneezing, watery eyes, runny nose  . Anxiety    Takes medication as needed.  . Insomnia    Takes medication as needed when trouble staying asleep    HPI  HTN: patient is not  taking medication daily, denies side effects. No chest pain, or palpitation. Her bp has been good without medication lately, we will decrease dose to once daily and monitor  AR: seasonal, started with rhinorrhea , no cough, no SOB , no wheezing, no itchy eyes, she will resume otc Allegra or Zyrtec otc   GERD: under control with medication, no heartburn, no regurgitation since esophageal dilation 2016, down to one Prilosec daily   Migraine: hemiplegic, she ran out of Topamax, back on Pamelor at night, symptoms are not as frequent now, last episodes was 2 months ago. She has a lot of nuchal pain secondary to DDD cervical spine. She states she was having too much tingling on Topamax and she is no longer taking it.   Metabolic Syndrome: discussed life style modification, she has htn, increase in abdominal girth and low HDL. , she is willing to have labs done again  Panic attacks: doing well at this time, no longer feels depressed, but has anxiety at times and panic attack with palpitation, feeling nervous.   Insomnia: taking Trazodone prn and it works well for well  Skin outbreak: she states that over the past 6 months she has facial break out, bumpy,  redness and dry, itchy over nose and cheeks. Tried changing her cleansers, always have used the same make up, and no improvement   Patient Active Problem List   Diagnosis Date Noted  . Dyslipidemia 10/03/2015  . Seasonal allergic rhinitis 04/29/2015  . History of esophageal stricture 01/28/2015  . Atypical nevus 01/27/2015  . Carpal tunnel syndrome 01/27/2015  . Insomnia, persistent 01/27/2015  . DDD (degenerative disc disease), cervical 01/27/2015  . Panic attacks 01/27/2015  . Gastric reflux 01/27/2015  . Headache, hemiplegic migraine 01/27/2015  . H/O transient cerebral ischemia 01/27/2015  . Hypertension, benign 01/27/2015  . Calculus of kidney 01/27/2015  . Adult BMI 30+ 01/27/2015  . Vitamin D deficiency 01/27/2015  . Snores 01/27/2015  . Aneurysm, cerebral, nonruptured 05/12/2013    Past Surgical History  Procedure Laterality Date  . Tubal ligation  1989  . Tooth extraction    . Hysteroscopy with novasure N/A 02/07/2015    Procedure: HYSTEROSCOPY WITH NOVASURE;  Surgeon: Will Bonnet, MD;  Location: [facility];  Service: Gynecology;  Laterality: N/A;  . Hysteroscopy w/d&c N/A 02/07/2015    Procedure: DILATATION AND CURETTAGE /HYSTEROSCOPY;  Surgeon: Will Bonnet, MD;  Location: [facility];  Service: Gynecology;  Laterality: N/A;    Family History  Problem Relation Age of Onset  . Asthma Mother   . Heart disease Mother   .  Hyperlipidemia Mother   . Asthma Son   . Heart attack Father   . Emphysema Mother   . Urolithiasis Son   . Tremor Mother   . Tremor Sister   . Tremor Brother   . Macular degeneration Mother   . Macular degeneration Maternal Grandmother     Social History   Social History  . Marital Status: Married    Spouse Name: Merry Proud  . Number of Children: 2  . Years of Education: 12th   Occupational History  . Medical records    Social History Main Topics  . Smoking status: Never Smoker   . Smokeless tobacco: Never Used  . Alcohol Use: No   . Drug Use: No  . Sexual Activity:    Partners: Male     Comment: BTL   Other Topics Concern  . Not on file   Social History Narrative   Patient lives at home with husband Merry Proud.    Patient works at [employer].    Patient has a high school education.    Patient has 2 children.      Current outpatient prescriptions:  .  ALPRAZolam (XANAX) 0.5 MG tablet, Take 1 tablet (0.5 mg total) by mouth daily as needed for anxiety (05-1 tablet)., Disp: 30 tablet, Rfl: 0 .  aspirin-acetaminophen-caffeine (EXCEDRIN MIGRAINE) 250-250-65 MG per tablet, Take 2 tablets by mouth every 6 (six) hours as needed for pain., Disp: , Rfl:  .  cyclobenzaprine (FLEXERIL) 10 MG tablet, Take 1 tablet (10 mg total) by mouth at bedtime. (Patient taking differently: Take 10 mg by mouth at bedtime as needed. ), Disp: 90 tablet, Rfl: 0 .  hydrochlorothiazide (HYDRODIURIL) 25 MG tablet, Take 1 tablet (25 mg total) by mouth daily., Disp: 90 tablet, Rfl: 1 .  metroNIDAZOLE (METROGEL) 1 % gel, Apply topically daily., Disp: 60 g, Rfl: 2 .  nortriptyline (PAMELOR) 10 MG capsule, Take 1 capsule (10 mg total) by mouth at bedtime., Disp: 90 capsule, Rfl: 1 .  omeprazole (PRILOSEC) 20 MG capsule, Take 1 capsule (20 mg total) by mouth daily., Disp: 90 capsule, Rfl: 1 .  sodium chloride (OCEAN) 0.65 % nasal spray, Place 1 spray into both nostrils daily as needed (for allergies). , Disp: , Rfl:  .  traZODone (DESYREL) 50 MG tablet, Take 1 tablet (50 mg total) by mouth at bedtime., Disp: 90 tablet, Rfl: 1  Allergies  Allergen Reactions  . Codeine Other (See Comments)    Severe headache   . Claritin  [Loratadine] Other (See Comments)  . Entex Lq  [Phenylephrine-Guaifenesin] Other (See Comments)  . Fexofenadine Other (See Comments)  . Loratadine-Pseudoephedrine Er Other (See Comments)  . Pseudoephedrine Other (See Comments)  . Tramadol Nausea Only     ROS  Constitutional: Negative for fever or weight change.   Respiratory: Negative for cough and shortness of breath.   Cardiovascular: Negative for chest pain or palpitations.  Gastrointestinal: Negative for abdominal pain, no bowel changes.  Musculoskeletal: Negative for gait problem or joint swelling.  Skin: Positive  for rash.  Neurological: Negative for dizziness or headache.  No other specific complaints in a complete review of systems (except as listed in HPI above).  Objective  Filed Vitals:   10/03/15 1558  BP: 118/78  Pulse: 79  Temp: 97.9 F (36.6 C)  TempSrc: Oral  Resp: 14  Height: 5\' 2"  (1.575 m)  Weight: 212 lb 6.4 oz (96.344 kg)  SpO2: 96%    Body mass index is 38.84  kg/(m^2).  Physical Exam  Constitutional: Patient appears well-developed and well-nourished. Obese  No distress.  HEENT: head atraumatic, normocephalic, pupils equal and reactive to light, neck supple, throat within normal limits Cardiovascular: Normal rate, regular rhythm and normal heart sounds.  No murmur heard. Trace  BLE edema. Pulmonary/Chest: Effort normal and breath sounds normal. No respiratory distress. Abdominal: Soft.  There is no tenderness. Psychiatric: Patient has a normal mood and affect. behavior is normal. Judgment and thought content normal.  PHQ2/9: Depression screen [facility] 2/9 10/03/2015 06/06/2015 04/29/2015 01/28/2015  Decreased Interest 0 0 0 0  Down, Depressed, Hopeless 0 0 0 0  PHQ - 2 Score 0 0 0 0    Fall Risk: Fall Risk  10/03/2015 06/06/2015 04/29/2015 01/28/2015  Falls in the past year? No No No No     Functional Status Survey: Is the patient deaf or have difficulty hearing?: No Does the patient have difficulty seeing, even when wearing glasses/contacts?: No Does the patient have difficulty concentrating, remembering, or making decisions?: No Does the patient have difficulty walking or climbing stairs?: No Does the patient have difficulty dressing or bathing?: No Does the patient have difficulty doing errands alone such as  visiting a doctor's office or shopping?: No    Assessment & Plan  1. Gastric reflux  - omeprazole (PRILOSEC) 20 MG capsule; Take 1 capsule (20 mg total) by mouth daily.  Dispense: 90 capsule; Refill: 1  2. Hypertension, benign  - hydrochlorothiazide (HYDRODIURIL) 25 MG tablet; Take 1 tablet (25 mg total) by mouth daily.  Dispense: 90 tablet; Refill: 1 - Comprehensive metabolic panel  3. Panic attacks  - ALPRAZolam (XANAX) 0.5 MG tablet; Take 1 tablet (0.5 mg total) by mouth daily as needed for anxiety (05-1 tablet).  Dispense: 30 tablet; Refill: 0  4. Hemiplegic migraine without status migrainosus, not intractable  - nortriptyline (PAMELOR) 10 MG capsule; Take 1 capsule (10 mg total) by mouth at bedtime.  Dispense: 90 capsule; Refill: 1  5. Adult BMI 30+  Discussed with the patient the risk posed by an increased BMI. Discussed importance of portion control, calorie counting and at least 150 minutes of physical activity weekly. Avoid sweet beverages and drink more water. Eat at least 6 servings of fruit and vegetables daily   6. Insomnia, persistent  Continue prn Trazodone  7. Metabolic syndrome  - Hemoglobin A1c  8. Vitamin D deficiency  - VITAMIN D 25 Hydroxy (Vit-D Deficiency, Fractures)  9. Dyslipidemia  - Lipid panel  10. Rosacea  Call back if no improvement for referral to Dermatologist - metroNIDAZOLE (METROGEL) 1 % gel; Apply topically daily.  Dispense: 60 g; Refill: 2

## 2015-10-16 ENCOUNTER — Ambulatory Visit
Admission: RE | Admit: 2015-10-16 | Discharge: 2015-10-16 | Disposition: A | Discharge status: Home / Self Care | Payer: BLUE CROSS/BLUE SHIELD | Source: Ambulatory Visit | Attending: Family Medicine | Admitting: Family Medicine

## 2015-10-16 DIAGNOSIS — Z1239 Encounter for other screening for malignant neoplasm of breast: Secondary | ICD-10-CM

## 2015-10-16 DIAGNOSIS — Z1231 Encounter for screening mammogram for malignant neoplasm of breast: Secondary | ICD-10-CM | POA: Insufficient documentation

## 2015-11-05 DIAGNOSIS — E8881 Metabolic syndrome: Secondary | ICD-10-CM | POA: Diagnosis not present

## 2015-11-05 DIAGNOSIS — E785 Hyperlipidemia, unspecified: Secondary | ICD-10-CM | POA: Diagnosis not present

## 2015-11-05 DIAGNOSIS — I10 Essential (primary) hypertension: Secondary | ICD-10-CM | POA: Diagnosis not present

## 2015-11-05 DIAGNOSIS — E559 Vitamin D deficiency, unspecified: Secondary | ICD-10-CM | POA: Diagnosis not present

## 2015-11-06 LAB — COMPREHENSIVE METABOLIC PANEL
ALT: 17 IU/L (ref 0–32)
AST: 14 IU/L (ref 0–40)
Albumin/Globulin Ratio: 1.5 (ref 1.2–2.2)
Albumin: 4.1 g/dL (ref 3.5–5.5)
Alkaline Phosphatase: 75 IU/L (ref 39–117)
BILIRUBIN TOTAL: 0.4 mg/dL (ref 0.0–1.2)
BUN/Creatinine Ratio: 13 (ref 9–23)
BUN: 10 mg/dL (ref 6–24)
CO2: 25 mmol/L (ref 18–29)
Calcium: 9.5 mg/dL (ref 8.7–10.2)
Chloride: 100 mmol/L (ref 96–106)
Creatinine, Ser: 0.78 mg/dL (ref 0.57–1.00)
GFR calc non Af Amer: 87 mL/min/{1.73_m2} (ref >59)
GFR, EST AFRICAN AMERICAN: 100 mL/min/{1.73_m2} (ref >59)
Globulin, Total: 2.8 g/dL (ref 1.5–4.5)
Glucose: 101 mg/dL — ABNORMAL HIGH (ref 65–99)
Potassium: 4.5 mmol/L (ref 3.5–5.2)
Sodium: 140 mmol/L (ref 134–144)
TOTAL PROTEIN: 6.9 g/dL (ref 6.0–8.5)

## 2015-11-06 LAB — LIPID PANEL
CHOL/HDL RATIO: 4 ratio (ref 0.0–4.4)
CHOLESTEROL TOTAL: 206 mg/dL — AB (ref 100–199)
HDL: 51 mg/dL (ref >39)
LDL Calculated: 132 mg/dL — ABNORMAL HIGH (ref 0–99)
Triglycerides: 115 mg/dL (ref 0–149)
VLDL CHOLESTEROL CAL: 23 mg/dL (ref 5–40)

## 2015-11-06 LAB — HEMOGLOBIN A1C
Est. average glucose Bld gHb Est-mCnc: 123 mg/dL
HEMOGLOBIN A1C: 5.9 % — AB (ref 4.8–5.6)

## 2015-11-06 LAB — VITAMIN D 25 HYDROXY (VIT D DEFICIENCY, FRACTURES): Vit D, 25-Hydroxy: 11.7 ng/mL — ABNORMAL LOW (ref 30.0–100.0)

## 2015-11-11 ENCOUNTER — Other Ambulatory Visit: Payer: Self-pay | Admitting: Family Medicine

## 2015-11-11 MED ORDER — VITAMIN D (ERGOCALCIFEROL) 1.25 MG (50000 UNIT) PO CAPS: 50000.0000 [IU] | ORAL_CAPSULE | ORAL | Status: DC

## 2015-11-21 ENCOUNTER — Ambulatory Visit (INDEPENDENT_AMBULATORY_CARE_PROVIDER_SITE_OTHER): Payer: BLUE CROSS/BLUE SHIELD | Admitting: Family Medicine

## 2015-11-21 ENCOUNTER — Encounter: Payer: Self-pay | Admitting: Family Medicine

## 2015-11-21 VITALS — BP 118/74 | HR 89 | Temp 98.2°F | Resp 16 | Ht 62.0 in | Wt 205.8 lb

## 2015-11-21 DIAGNOSIS — L989 Disorder of the skin and subcutaneous tissue, unspecified: Secondary | ICD-10-CM | POA: Diagnosis not present

## 2015-11-21 DIAGNOSIS — Z01419 Encounter for gynecological examination (general) (routine) without abnormal findings: Secondary | ICD-10-CM

## 2015-11-21 DIAGNOSIS — Z124 Encounter for screening for malignant neoplasm of cervix: Secondary | ICD-10-CM | POA: Diagnosis not present

## 2015-11-21 DIAGNOSIS — Z0000 Encounter for general adult medical examination without abnormal findings: Secondary | ICD-10-CM | POA: Diagnosis not present

## 2015-11-21 DIAGNOSIS — E668 Other obesity: Secondary | ICD-10-CM | POA: Diagnosis not present

## 2015-11-21 DIAGNOSIS — IMO0002 Reserved for concepts with insufficient information to code with codable children: Secondary | ICD-10-CM

## 2015-11-21 NOTE — Progress Notes (Addendum)
Name: Monique Daniels   MRN: [id number]    DOB: 12/29/1960   Date:11/21/2015       Progress Note  Subjective  Chief Complaint  Chief Complaint  Patient presents with  . Annual Exam    HPI  Well Woman: she is married past 55 yo, she has low sex drive, occasionally has dyspaurenia occasionally, but states not a problem at this time. She has some stress incontinences - that is getting worse.  Discussed recently labs with patient.   Obesity: she has not been to the gym because her husband gets jealous, but she has decreased sweet intake, only drinking water, cooking more at home, eating out less, and she has lost over 6 lbs since last visit.    Patient Active Problem List   Diagnosis Date Noted  . Dyslipidemia 10/03/2015  . Seasonal allergic rhinitis 04/29/2015  . History of esophageal stricture 01/28/2015  . Atypical nevus 01/27/2015  . Carpal tunnel syndrome 01/27/2015  . Insomnia, persistent 01/27/2015  . DDD (degenerative disc disease), cervical 01/27/2015  . Panic attacks 01/27/2015  . Gastric reflux 01/27/2015  . Headache, hemiplegic migraine 01/27/2015  . H/O transient cerebral ischemia 01/27/2015  . Hypertension, benign 01/27/2015  . Calculus of kidney 01/27/2015  . Adult BMI 30+ 01/27/2015  . Vitamin D deficiency 01/27/2015  . Snores 01/27/2015  . Aneurysm, cerebral, nonruptured 05/12/2013    Past Surgical History  Procedure Laterality Date  . Tubal ligation  1989  . Tooth extraction    . Hysteroscopy with novasure N/A 02/07/2015    Procedure: HYSTEROSCOPY WITH NOVASURE;  Surgeon: Will Bonnet, MD;  Location: [facility] ORS;  Service: Gynecology;  Laterality: N/A;  . Hysteroscopy w/d&c N/A 02/07/2015    Procedure: DILATATION AND CURETTAGE /HYSTEROSCOPY;  Surgeon: Will Bonnet, MD;  Location: [facility] OR[facility];  Service: Gynecology;  Laterality: N/A;    Family History  Problem Relation Age of Onset  . Asthma Mother   . Heart disease Mother   . Hyperlipidemia Mother    . Emphysema Mother   . Tremor Mother   . Macular degeneration Mother   . Asthma Son   . Heart attack Father   . Urolithiasis Son   . Tremor Sister   . Tremor Brother   . Macular degeneration Maternal Grandmother   . Breast cancer Neg Hx     Social History   Social History  . Marital Status: Married    Spouse Name: Merry Proud  . Number of Children: 2  . Years of Education: 12th   Occupational History  . Medical records    Social History Main Topics  . Smoking status: Never Smoker   . Smokeless tobacco: Never Used  . Alcohol Use: No  . Drug Use: No  . Sexual Activity:    Partners: Male     Comment: BTL   Other Topics Concern  . Not on file   Social History Narrative   Patient lives at home with husband Merry Proud.    Patient works at [employer].    Patient has a high school education.    Patient has 2 children.      Current outpatient prescriptions:  .  ALPRAZolam (XANAX) 0.5 MG tablet, Take 1 tablet (0.5 mg total) by mouth daily as needed for anxiety (05-1 tablet)., Disp: 30 tablet, Rfl: 0 .  aspirin-acetaminophen-caffeine (EXCEDRIN MIGRAINE) [id number] MG per tablet, Take 2 tablets by mouth every 6 (six) hours as needed for pain., Disp: , Rfl:  .  cyclobenzaprine (FLEXERIL) 10 MG tablet, Take 1 tablet (10 mg total) by mouth at bedtime. (Patient taking differently: Take 10 mg by mouth at bedtime as needed. ), Disp: 90 tablet, Rfl: 0 .  hydrochlorothiazide (HYDRODIURIL) 25 MG tablet, Take 1 tablet (25 mg total) by mouth daily., Disp: 90 tablet, Rfl: 1 .  metroNIDAZOLE (METROGEL) 1 % gel, Apply topically daily., Disp: 60 g, Rfl: 2 .  Multiple Vitamins-Minerals (CENTRUM SILVER ADULT 50+ PO), Take 1 tablet by mouth daily., Disp: , Rfl:  .  nortriptyline (PAMELOR) 10 MG capsule, Take 1 capsule (10 mg total) by mouth at bedtime., Disp: 90 capsule, Rfl: 1 .  omeprazole (PRILOSEC) 20 MG capsule, Take 1 capsule (20 mg total) by mouth daily., Disp: 90 capsule, Rfl: 1 .   sodium chloride (OCEAN) 0.65 % nasal spray, Place 1 spray into both nostrils daily as needed (for allergies). , Disp: , Rfl:  .  traZODone (DESYREL) 50 MG tablet, Take 1 tablet (50 mg total) by mouth at bedtime., Disp: 90 tablet, Rfl: 1 .  Vitamin D, Ergocalciferol, (DRISDOL) 50000 units CAPS capsule, Take 1 capsule (50,000 Units total) by mouth every 7 (seven) days., Disp: 12 capsule, Rfl: 0  Allergies  Allergen Reactions  . Codeine Other (See Comments)    Severe headache   . Claritin  [Loratadine] Other (See Comments)  . Entex Lq  [Phenylephrine-Guaifenesin] Other (See Comments)  . Fexofenadine Other (See Comments)  . Loratadine-Pseudoephedrine Er Other (See Comments)  . Pseudoephedrine Other (See Comments)  . Tramadol Nausea Only     ROS  Constitutional: Negative for fever, positive for weight change.  Respiratory: Negative for cough and shortness of breath.   Cardiovascular: Negative for chest pain or palpitations.  Gastrointestinal: Negative for abdominal pain, no bowel changes.  Musculoskeletal: Negative for gait problem or joint swelling.  Skin: Negative for rash.  Neurological: Negative for dizziness or headache.  No other specific complaints in a complete review of systems (except as listed in HPI above).  Objective  Filed Vitals:   11/21/15 0822  BP: 118/74  Pulse: 89  Temp: 98.2 F (36.8 C)  TempSrc: Oral  Resp: 16  Height: 5\' 2"  (1.575 m)  Weight: 205 lb 12.8 oz (93.35 kg)  SpO2: 98%    Body mass index is 37.63 kg/(m^2).  Physical Exam  Constitutional: Patient appears well-developed and obese. No distress.  HENT: Head: Normocephalic and atraumatic. Ears: B TMs ok, no erythema or effusion; Nose: Nose normal. Mouth/Throat: Oropharynx is clear and moist. No oropharyngeal exudate.  Eyes: Conjunctivae and EOM are normal. Pupils are equal, round, and reactive to light. No scleral icterus.  Neck: Normal range of motion. Neck supple. No JVD present. No  thyromegaly present.  Cardiovascular: Normal rate, regular rhythm and normal heart sounds.  No murmur heard. No BLE edema. Pulmonary/Chest: Effort normal and breath sounds normal. No respiratory distress. Abdominal: Soft. Bowel sounds are normal, no distension. There is no tenderness. no masses Breast: no lumps or masses, no nipple discharge or rashes FEMALE GENITALIA:  External genitalia normal External urethra normal Vaginal vault normal without discharge or lesions Cervix normal without discharge or lesions Bimanual exam normal without masses RECTAL: not done Musculoskeletal: Normal range of motion, no joint effusions. No gross deformities Neurological: he is alert and oriented to person, place, and time. No cranial nerve deficit. Coordination, balance, strength, speech and gait are normal.  Skin: Skin is warm and dry. No rash noted. No erythema.  Multiple atypical moles - large  mole on right upper back  Psychiatric: Patient has a normal mood and affect. behavior is normal. Judgment and thought content normal.  Recent Results (from the past 2160 hour(s))  Lipid panel     Status: Abnormal   Collection Time: 11/05/15  8:51 AM  Result Value Ref Range   Cholesterol, Total 206 (H) 100 - 199 mg/dL   Triglycerides 115 0 - 149 mg/dL   HDL 51 >39 mg/dL   VLDL Cholesterol Cal 23 5 - 40 mg/dL   LDL Calculated 132 (H) 0 - 99 mg/dL   Chol/HDL Ratio 4.0 0.0 - 4.4 ratio units    Comment:                                   T. Chol/HDL Ratio                                             Men  Women                               1/2 Avg.Risk  3.4    3.3                                   Avg.Risk  5.0    4.4                                2X Avg.Risk  9.6    7.1                                3X Avg.Risk 23.4   11.0   Hemoglobin A1c     Status: Abnormal   Collection Time: 11/05/15  8:51 AM  Result Value Ref Range   Hgb A1c MFr Bld 5.9 (H) 4.8 - 5.6 %    Comment:          Pre-diabetes: 5.7 - 6.4           Diabetes: >6.4          Glycemic control for adults with diabetes: <7.0    Est. average glucose Bld gHb Est-mCnc 123 mg/dL  Comprehensive metabolic panel     Status: Abnormal   Collection Time: 11/05/15  8:51 AM  Result Value Ref Range   Glucose 101 (H) 65 - 99 mg/dL   BUN 10 6 - 24 mg/dL   Creatinine, Ser 0.78 0.57 - 1.00 mg/dL   GFR calc non Af Amer 87 >59 mL/min/1.73   GFR calc Af Amer 100 >59 mL/min/1.73   BUN/Creatinine Ratio 13 9 - 23    Comment:               **Please note reference interval change**   Sodium 140 134 - 144 mmol/L   Potassium 4.5 3.5 - 5.2 mmol/L   Chloride 100 96 - 106 mmol/L   CO2 25 18 - 29 mmol/L   Calcium 9.5 8.7 - 10.2 mg/dL   Total Protein 6.9 6.0 - 8.5 g/dL   Albumin 4.1 3.5 - 5.5 g/dL   Globulin, Total  2.8 1.5 - 4.5 g/dL   Albumin/Globulin Ratio 1.5 1.2 - 2.2    Comment:               **Please note reference interval change**   Bilirubin Total 0.4 0.0 - 1.2 mg/dL   Alkaline Phosphatase 75 39 - 117 IU/L   AST 14 0 - 40 IU/L   ALT 17 0 - 32 IU/L  VITAMIN D 25 Hydroxy (Vit-D Deficiency, Fractures)     Status: Abnormal   Collection Time: 11/05/15  8:51 AM  Result Value Ref Range   Vit D, 25-Hydroxy 11.7 (L) 30.0 - 100.0 ng/mL    Comment: Vitamin D deficiency has been defined by the [place] practice guideline as a level of serum 25-OH vitamin D less than 20 ng/mL (1,2). The Endocrine Society went on to further define vitamin D insufficiency as a level between 21 and 29 ng/mL (2). 1. IOM (Institute of Medicine). 2010. Dietary reference    intakes for calcium and D. [place]: The    [employer]. 2. Holick MF, Binkley [place], Bischoff-Ferrari HA, et al.    Evaluation, treatment, and prevention of vitamin D    deficiency: an Endocrine Society clinical practice    guideline. JCEM. 2011 Jul; 96(7):1911-30.      PHQ2/9: Depression screen [facility] 2/9 10/03/2015 06/06/2015 04/29/2015 01/28/2015   Decreased Interest 0 0 0 0  Down, Depressed, Hopeless 0 0 0 0  PHQ - 2 Score 0 0 0 0     Fall Risk: Fall Risk  10/03/2015 06/06/2015 04/29/2015 01/28/2015  Falls in the past year? No No No No     Assessment & Plan  1. Well woman exam  Discussed importance of 150 minutes of physical activity weekly, eat two servings of fish weekly, eat one serving of tree nuts ( cashews, pistachios, pecans, almonds.Marland Kitchen) every other day, eat 6 servings of fruit/vegetables daily and drink plenty of water and avoid sweet beverages.   2. Cervical cancer screening  - PapLb, HPV, rfx16/18  3. Adult BMI 30+  Doing great with life style modification, lose weight and needs to increase physical activity.   4. Skin lesion  - Ambulatory referral to Dermatology

## 2015-11-21 NOTE — Addendum Note (Signed)
Addended by: Steele Sizer F on: 11/21/2015 12:04 PM   Modules accepted: Orders, SmartSet

## 2015-11-28 LAB — PAPLB, HPV, RFX16/18
HPV, high-risk: NEGATIVE
PAP SMEAR COMMENT: 0

## 2015-12-05 DIAGNOSIS — D485 Neoplasm of uncertain behavior of skin: Secondary | ICD-10-CM | POA: Diagnosis not present

## 2015-12-05 DIAGNOSIS — D229 Melanocytic nevi, unspecified: Secondary | ICD-10-CM | POA: Diagnosis not present

## 2015-12-05 DIAGNOSIS — L578 Other skin changes due to chronic exposure to nonionizing radiation: Secondary | ICD-10-CM | POA: Diagnosis not present

## 2015-12-05 DIAGNOSIS — D225 Melanocytic nevi of trunk: Secondary | ICD-10-CM | POA: Diagnosis not present

## 2015-12-05 DIAGNOSIS — L812 Freckles: Secondary | ICD-10-CM | POA: Diagnosis not present

## 2016-01-09 ENCOUNTER — Ambulatory Visit (INDEPENDENT_AMBULATORY_CARE_PROVIDER_SITE_OTHER): Payer: BLUE CROSS/BLUE SHIELD | Admitting: Family Medicine

## 2016-01-09 ENCOUNTER — Encounter: Payer: Self-pay | Admitting: Family Medicine

## 2016-01-09 VITALS — BP 130/78 | HR 77 | Temp 97.8°F | Resp 16 | Wt 193.6 lb

## 2016-01-09 DIAGNOSIS — M7062 Trochanteric bursitis, left hip: Secondary | ICD-10-CM

## 2016-01-09 DIAGNOSIS — R7303 Prediabetes: Secondary | ICD-10-CM | POA: Insufficient documentation

## 2016-01-09 MED ORDER — LIDOCAINE HCL (PF) 1 % IJ SOLN
2.0000 mL | Freq: Once | INTRAMUSCULAR | Status: AC
  Administered 2016-01-09: 2 mL via INTRADERMAL

## 2016-01-09 MED ORDER — TRIAMCINOLONE ACETONIDE 40 MG/ML IJ SUSP
40.0000 mg | Freq: Once | INTRAMUSCULAR | Status: AC
  Administered 2016-01-09: 40 mg via INTRAMUSCULAR

## 2016-01-09 NOTE — Progress Notes (Signed)
Name: Monique Daniels   MRN: [id number]    DOB: 1961/01/08   Date:01/09/2016       Progress Note  Subjective  Chief Complaint  Chief Complaint  Patient presents with  . Hip Pain    left hip pain. patient presents with left hip pain that is penetrating. she stated that it radiates down her leg when she lays on it. she has tried tkaing ibuprofen.    HPI   Trochanteric bursitis: she had similar episode last year and did well after steroid injection. She states symptoms returned about 2 months ago but is getting progressively worse and is keeping her up at night. No problems with rom of back , she has some burning going down left lateral leg when on left lateral decubitus only  Pre-diabetes: glucose and hgbA1C elevated on her last visit, she denies polyphagia, polydipsia or polyuria, and has lost weight since labs were done in April. She has change her diet and glucose at home has been between 87- 103  Patient Active Problem List   Diagnosis Date Noted  . Dyslipidemia 10/03/2015  . Seasonal allergic rhinitis 04/29/2015  . History of esophageal stricture 01/28/2015  . Atypical nevus 01/27/2015  . Carpal tunnel syndrome 01/27/2015  . Insomnia, persistent 01/27/2015  . DDD (degenerative disc disease), cervical 01/27/2015  . Panic attacks 01/27/2015  . Gastric reflux 01/27/2015  . Headache, hemiplegic migraine 01/27/2015  . H/O transient cerebral ischemia 01/27/2015  . Hypertension, benign 01/27/2015  . Calculus of kidney 01/27/2015  . Adult BMI 30+ 01/27/2015  . Vitamin D deficiency 01/27/2015  . Snores 01/27/2015  . Aneurysm, cerebral, nonruptured 05/12/2013    Past Surgical History  Procedure Laterality Date  . Tubal ligation  1989  . Tooth extraction    . Hysteroscopy with novasure N/A 02/07/2015    Procedure: HYSTEROSCOPY WITH NOVASURE;  Surgeon: Will Bonnet, MD;  Location: [facility] ORS;  Service: Gynecology;  Laterality: N/A;  . Hysteroscopy w/d&c N/A 02/07/2015   Procedure: DILATATION AND CURETTAGE /HYSTEROSCOPY;  Surgeon: Will Bonnet, MD;  Location: [facility] ORS;  Service: Gynecology;  Laterality: N/A;    Family History  Problem Relation Age of Onset  . Asthma Mother   . Heart disease Mother   . Hyperlipidemia Mother   . Emphysema Mother   . Tremor Mother   . Macular degeneration Mother   . Asthma Son   . Heart attack Father   . Urolithiasis Son   . Tremor Sister   . Tremor Brother   . Macular degeneration Maternal Grandmother   . Breast cancer Neg Hx     Social History   Social History  . Marital Status: Married    Spouse Name: Merry Proud  . Number of Children: 2  . Years of Education: 12th   Occupational History  . Medical records    Social History Main Topics  . Smoking status: Never Smoker   . Smokeless tobacco: Never Used  . Alcohol Use: No  . Drug Use: No  . Sexual Activity:    Partners: Male     Comment: BTL   Other Topics Concern  . Not on file   Social History Narrative   Patient lives at home with husband Merry Proud.    Patient works at [employer].    Patient has a high school education.    Patient has 2 children.      Current outpatient prescriptions:  .  ALPRAZolam (XANAX) 0.5 MG tablet, Take 1 tablet (0.5  mg total) by mouth daily as needed for anxiety (05-1 tablet)., Disp: 30 tablet, Rfl: 0 .  aspirin-acetaminophen-caffeine (EXCEDRIN MIGRAINE) 250-250-65 MG per tablet, Take 2 tablets by mouth every 6 (six) hours as needed for pain., Disp: , Rfl:  .  cyclobenzaprine (FLEXERIL) 10 MG tablet, Take 1 tablet (10 mg total) by mouth at bedtime. (Patient taking differently: Take 10 mg by mouth at bedtime as needed. ), Disp: 90 tablet, Rfl: 0 .  hydrochlorothiazide (HYDRODIURIL) 25 MG tablet, Take 1 tablet (25 mg total) by mouth daily., Disp: 90 tablet, Rfl: 1 .  metroNIDAZOLE (METROGEL) 1 % gel, Apply topically daily., Disp: 60 g, Rfl: 2 .  Multiple Vitamins-Minerals (CENTRUM SILVER ADULT 50+ PO), Take 1 tablet  by mouth daily., Disp: , Rfl:  .  nortriptyline (PAMELOR) 10 MG capsule, Take 1 capsule (10 mg total) by mouth at bedtime., Disp: 90 capsule, Rfl: 1 .  omeprazole (PRILOSEC) 20 MG capsule, Take 1 capsule (20 mg total) by mouth daily., Disp: 90 capsule, Rfl: 1 .  sodium chloride (OCEAN) 0.65 % nasal spray, Place 1 spray into both nostrils daily as needed (for allergies). , Disp: , Rfl:  .  traZODone (DESYREL) 50 MG tablet, Take 1 tablet (50 mg total) by mouth at bedtime., Disp: 90 tablet, Rfl: 1 .  Vitamin D, Ergocalciferol, (DRISDOL) 50000 units CAPS capsule, Take 1 capsule (50,000 Units total) by mouth every 7 (seven) days., Disp: 12 capsule, Rfl: 0  Current facility-administered medications:  .  lidocaine (PF) (XYLOCAINE) 1 % injection 2 mL, 2 mL, Intradermal, Once, Steele Sizer, MD .  triamcinolone acetonide (KENALOG-40) injection 40 mg, 40 mg, Intramuscular, Once, Steele Sizer, MD  Allergies  Allergen Reactions  . Codeine Other (See Comments)    Severe headache   . Claritin  [Loratadine] Other (See Comments)  . Entex Lq  [Phenylephrine-Guaifenesin] Other (See Comments)  . Fexofenadine Other (See Comments)  . Loratadine-Pseudoephedrine Er Other (See Comments)  . Pseudoephedrine Other (See Comments)  . Tramadol Nausea Only     ROS  Ten systems reviewed and is negative except as mentioned in HPI   Objective  Filed Vitals:   01/09/16 1225  BP: 130/78  Pulse: 77  Temp: 97.8 F (36.6 C)  TempSrc: Oral  Resp: 16  Weight: 193 lb 9.6 oz (87.816 kg)  SpO2: 96%    Body mass index is 35.4 kg/(m^2).  Physical Exam  Constitutional: Patient appears well-developed and well-nourished. Obese  No distress.  HEENT: head atraumatic, normocephalic, pupils equal and reactive to light,neck supple, throat within normal limits Cardiovascular: Normal rate, regular rhythm and normal heart sounds.  No murmur heard. No BLE edema. Pulmonary/Chest: Effort normal and breath sounds normal. No  respiratory distress. Abdominal: Soft.  There is no tenderness. Psychiatric: Patient has a normal mood and affect. behavior is normal. Judgment and thought content normal. Muscular Skeletal: pain during palpation of left trochanteric bursa, negative straight leg raise, normal lumbar spine exam  Recent Results (from the past 2160 hour(s))  Lipid panel     Status: Abnormal   Collection Time: 11/05/15  8:51 AM  Result Value Ref Range   Cholesterol, Total 206 (H) 100 - 199 mg/dL   Triglycerides 115 0 - 149 mg/dL   HDL 51 >39 mg/dL   VLDL Cholesterol Cal 23 5 - 40 mg/dL   LDL Calculated 132 (H) 0 - 99 mg/dL   Chol/HDL Ratio 4.0 0.0 - 4.4 ratio units    Comment:  T. Chol/HDL Ratio                                             Men  Women                               1/2 Avg.Risk  3.4    3.3                                   Avg.Risk  5.0    4.4                                2X Avg.Risk  9.6    7.1                                3X Avg.Risk 23.4   11.0   Hemoglobin A1c     Status: Abnormal   Collection Time: 11/05/15  8:51 AM  Result Value Ref Range   Hgb A1c MFr Bld 5.9 (H) 4.8 - 5.6 %    Comment:          Pre-diabetes: 5.7 - 6.4          Diabetes: >6.4          Glycemic control for adults with diabetes: <7.0    Est. average glucose Bld gHb Est-mCnc 123 mg/dL  Comprehensive metabolic panel     Status: Abnormal   Collection Time: 11/05/15  8:51 AM  Result Value Ref Range   Glucose 101 (H) 65 - 99 mg/dL   BUN 10 6 - 24 mg/dL   Creatinine, Ser 0.78 0.57 - 1.00 mg/dL   GFR calc non Af Amer 87 >59 mL/min/1.73   GFR calc Af Amer 100 >59 mL/min/1.73   BUN/Creatinine Ratio 13 9 - 23    Comment:               **Please note reference interval change**   Sodium 140 134 - 144 mmol/L   Potassium 4.5 3.5 - 5.2 mmol/L   Chloride 100 96 - 106 mmol/L   CO2 25 18 - 29 mmol/L   Calcium 9.5 8.7 - 10.2 mg/dL   Total Protein 6.9 6.0 - 8.5 g/dL   Albumin 4.1 3.5 -  5.5 g/dL   Globulin, Total 2.8 1.5 - 4.5 g/dL   Albumin/Globulin Ratio 1.5 1.2 - 2.2    Comment:               **Please note reference interval change**   Bilirubin Total 0.4 0.0 - 1.2 mg/dL   Alkaline Phosphatase 75 39 - 117 IU/L   AST 14 0 - 40 IU/L   ALT 17 0 - 32 IU/L  VITAMIN D 25 Hydroxy (Vit-D Deficiency, Fractures)     Status: Abnormal   Collection Time: 11/05/15  8:51 AM  Result Value Ref Range   Vit D, 25-Hydroxy 11.7 (L) 30.0 - 100.0 ng/mL    Comment: Vitamin D deficiency has been defined by the [place] and an Endocrine Society practice guideline as a level of serum 25-OH vitamin D less than 20 ng/mL (1,2). The Endocrine  Society went on to further define vitamin D insufficiency as a level between 21 and 29 ng/mL (2). 1. IOM (Institute of Medicine). 2010. Dietary reference    intakes for calcium and D. [place]: The    [employer]. 2. Holick MF, Binkley [place], Bischoff-Ferrari HA, et al.    Evaluation, treatment, and prevention of vitamin D    deficiency: an Endocrine Society clinical practice    guideline. JCEM. 2011 Jul; 96(7):1911-30.   PapLb, HPV, rfx16/18     Status: None   Collection Time: 11/21/15 12:00 AM  Result Value Ref Range   DIAGNOSIS: Comment     Comment: NEGATIVE FOR INTRAEPITHELIAL LESION AND MALIGNANCY.   Specimen adequacy: Comment     Comment: Satisfactory for evaluation. Endocervical and/or squamous metaplastic cells (endocervical component) are present.    CLINICIAN PROVIDED ICD10: Comment     Comment: Z12.4   Performed by: Comment     Comment: Joneen Caraway, Cytotechnologist (ASCP)   QC reviewed by: Comment     Comment: Ranell Patrick, Cytotechnologist (ASCP)   PAP SMEAR COMMENT .    Note: Comment     Comment: The Pap smear is a screening test designed to aid in the detection of premalignant and malignant conditions of the uterine cervix.  It is not a diagnostic procedure and should not be used as the sole means  of detecting cervical cancer.  Both false-positive and false-negative reports do occur.    HPV, high-risk Negative Negative    Comment: This high-risk HPV test detects thirteen high-risk types (16/18/31/33/35/39/45/51/52/56/58/59/68) without differentiation.     PHQ2/9: Depression screen [facility] 2/9 01/09/2016 10/03/2015 06/06/2015 04/29/2015 01/28/2015  Decreased Interest 0 0 0 0 0  Down, Depressed, Hopeless 0 0 0 0 0  PHQ - 2 Score 0 0 0 0 0     Fall Risk: Fall Risk  01/09/2016 10/03/2015 06/06/2015 04/29/2015 01/28/2015  Falls in the past year? No No No No No     Functional Status Survey: Is the patient deaf or have difficulty hearing?: No Does the patient have difficulty seeing, even when wearing glasses/contacts?: No Does the patient have difficulty concentrating, remembering, or making decisions?: No Does the patient have difficulty walking or climbing stairs?: Yes (due to left hip pain) Does the patient have difficulty dressing or bathing?: No Does the patient have difficulty doing errands alone such as visiting a doctor's office or shopping?: No    Assessment & Plan  1. Trochanteric bursitis of left hip  - lidocaine (PF) (XYLOCAINE) 1 % injection 2 mL; Inject 2 mLs into the skin once. - triamcinolone acetonide (KENALOG-40) injection 40 mg; Inject 1 mL (40 mg total) into the muscle once. Consent form signed Localized bursa - left trochanteric Area prepped with alcohol  Injection with lidocaine 1% and Kenalog 40mg /1 ml on bursa sack Patient tolerated procedure well No side effects   2. Prediabetes  Doing great. Continue life style modification

## 2016-01-30 ENCOUNTER — Other Ambulatory Visit: Payer: Self-pay | Admitting: Family Medicine

## 2016-01-30 NOTE — Telephone Encounter (Signed)
Patient requesting refill. 

## 2016-03-28 ENCOUNTER — Other Ambulatory Visit: Payer: Self-pay | Admitting: Family Medicine

## 2016-03-28 DIAGNOSIS — I10 Essential (primary) hypertension: Secondary | ICD-10-CM

## 2016-03-28 DIAGNOSIS — K219 Gastro-esophageal reflux disease without esophagitis: Secondary | ICD-10-CM

## 2016-04-08 ENCOUNTER — Encounter: Payer: Self-pay | Admitting: Family Medicine

## 2016-04-08 ENCOUNTER — Emergency Department: Payer: BLUE CROSS/BLUE SHIELD

## 2016-04-08 ENCOUNTER — Ambulatory Visit (INDEPENDENT_AMBULATORY_CARE_PROVIDER_SITE_OTHER): Payer: BLUE CROSS/BLUE SHIELD | Admitting: Family Medicine

## 2016-04-08 ENCOUNTER — Emergency Department
Admission: EM | Admit: 2016-04-08 | Discharge: 2016-04-08 | Disposition: A | Discharge status: Home / Self Care | Payer: BLUE CROSS/BLUE SHIELD | Attending: Emergency Medicine | Admitting: Emergency Medicine

## 2016-04-08 ENCOUNTER — Ambulatory Visit: Payer: BLUE CROSS/BLUE SHIELD | Admitting: Family Medicine

## 2016-04-08 VITALS — BP 144/86 | HR 71 | Temp 98.0°F | Resp 18 | Ht 62.0 in | Wt 191.2 lb

## 2016-04-08 DIAGNOSIS — R200 Anesthesia of skin: Secondary | ICD-10-CM

## 2016-04-08 DIAGNOSIS — R51 Headache: Secondary | ICD-10-CM | POA: Insufficient documentation

## 2016-04-08 DIAGNOSIS — R42 Dizziness and giddiness: Secondary | ICD-10-CM | POA: Diagnosis not present

## 2016-04-08 DIAGNOSIS — R269 Unspecified abnormalities of gait and mobility: Secondary | ICD-10-CM

## 2016-04-08 DIAGNOSIS — Z7982 Long term (current) use of aspirin: Secondary | ICD-10-CM | POA: Insufficient documentation

## 2016-04-08 DIAGNOSIS — R202 Paresthesia of skin: Secondary | ICD-10-CM | POA: Insufficient documentation

## 2016-04-08 DIAGNOSIS — M4802 Spinal stenosis, cervical region: Secondary | ICD-10-CM | POA: Insufficient documentation

## 2016-04-08 DIAGNOSIS — M542 Cervicalgia: Secondary | ICD-10-CM | POA: Diagnosis not present

## 2016-04-08 DIAGNOSIS — Z8673 Personal history of transient ischemic attack (TIA), and cerebral infarction without residual deficits: Secondary | ICD-10-CM | POA: Diagnosis not present

## 2016-04-08 DIAGNOSIS — R110 Nausea: Secondary | ICD-10-CM

## 2016-04-08 DIAGNOSIS — Z79899 Other long term (current) drug therapy: Secondary | ICD-10-CM | POA: Insufficient documentation

## 2016-04-08 DIAGNOSIS — R260 Ataxic gait: Secondary | ICD-10-CM | POA: Diagnosis not present

## 2016-04-08 DIAGNOSIS — R270 Ataxia, unspecified: Secondary | ICD-10-CM

## 2016-04-08 DIAGNOSIS — I10 Essential (primary) hypertension: Secondary | ICD-10-CM | POA: Diagnosis not present

## 2016-04-08 DIAGNOSIS — R208 Other disturbances of skin sensation: Secondary | ICD-10-CM

## 2016-04-08 LAB — URINALYSIS COMPLETE WITH MICROSCOPIC (ARMC ONLY)
BILIRUBIN URINE: NEGATIVE
GLUCOSE, UA: NEGATIVE mg/dL
HGB URINE DIPSTICK: NEGATIVE
KETONES UR: NEGATIVE mg/dL
Leukocytes, UA: NEGATIVE
NITRITE: NEGATIVE
Protein, ur: NEGATIVE mg/dL
SPECIFIC GRAVITY, URINE: 1.012 (ref 1.005–1.030)
pH: 6 (ref 5.0–8.0)

## 2016-04-08 LAB — BASIC METABOLIC PANEL
ANION GAP: 6 (ref 5–15)
BUN: 11 mg/dL (ref 6–20)
CALCIUM: 8.9 mg/dL (ref 8.9–10.3)
CO2: 28 mmol/L (ref 22–32)
Chloride: 104 mmol/L (ref 101–111)
Creatinine, Ser: 0.82 mg/dL (ref 0.44–1.00)
GFR calc Af Amer: >60 mL/min (ref >60)
GLUCOSE: 93 mg/dL (ref 65–99)
Potassium: 3.6 mmol/L (ref 3.5–5.1)
Sodium: 138 mmol/L (ref 135–145)

## 2016-04-08 LAB — DIFFERENTIAL
Basophils Absolute: 0.1 10*3/uL (ref 0–0.1)
Basophils Relative: 1 %
EOS PCT: 9 %
Eosinophils Absolute: 0.5 10*3/uL (ref 0–0.7)
LYMPHS ABS: 1.9 10*3/uL (ref 1.0–3.6)
LYMPHS PCT: 31 %
MONO ABS: 0.5 10*3/uL (ref 0.2–0.9)
Monocytes Relative: 9 %
Neutro Abs: 3.1 10*3/uL (ref 1.4–6.5)
Neutrophils Relative %: 50 %

## 2016-04-08 LAB — HEPATIC FUNCTION PANEL
ALBUMIN: 3.8 g/dL (ref 3.5–5.0)
ALK PHOS: 57 U/L (ref 38–126)
ALT: 17 U/L (ref 14–54)
AST: 21 U/L (ref 15–41)
Bilirubin, Direct: <0.1 mg/dL — ABNORMAL LOW (ref 0.1–0.5)
Total Protein: 7.3 g/dL (ref 6.5–8.1)

## 2016-04-08 LAB — CBC
HCT: 41.9 % (ref 35.0–47.0)
Hemoglobin: 14.7 g/dL (ref 12.0–16.0)
MCH: 30.1 pg (ref 26.0–34.0)
MCHC: 35.2 g/dL (ref 32.0–36.0)
MCV: 85.4 fL (ref 80.0–100.0)
Platelets: 251 10*3/uL (ref 150–440)
RBC: 4.9 MIL/uL (ref 3.80–5.20)
RDW: 13.7 % (ref 11.5–14.5)
WBC: 6.5 10*3/uL (ref 3.6–11.0)

## 2016-04-08 LAB — TROPONIN I: Troponin I: <0.03 ng/mL (ref <0.03)

## 2016-04-08 MED ORDER — GADOBENATE DIMEGLUMINE 529 MG/ML IV SOLN
20.0000 mL | Freq: Once | INTRAVENOUS | Status: AC | PRN
  Administered 2016-04-08: 18 mL via INTRAVENOUS

## 2016-04-08 MED ORDER — PREDNISONE 10 MG (48) PO TBPK: ORAL_TABLET | Freq: Every day | ORAL | 0 refills | Status: DC

## 2016-04-08 NOTE — Progress Notes (Addendum)
Name: Monique Daniels   MRN: [id number]    DOB: 1961/05/11   Date:04/08/2016       Progress Note  Subjective  Chief Complaint  Chief Complaint  Patient presents with  . Neck Pain    Patient states when trying to pull her keys out of her hair yesterday she over extended her neck to the left. Since then she has had a burning sensation down her neck, swelling, tenderness in the back of her neck, dizziness, lightheadness, motion sickness and her equilibrium is off. Patient has taking medication for a migraine that happened immediately afterwards that relieved her migraine some.    HPI  Acute neck pain: she states she developed acute pain and spasm on nuchal area that radiated to neck when she pulled her tag from right side of her shirt and it got caught in her hair. Initially she had tingling on both hands, followed by nausea, dizziness and loss of balance. She has a history of hemiplegic migraine. She states she developed a dull headache yesterday afternoon, but it was after the onset of nausea, vomiting and dizziness. She had difficulty driving in the afternoon, continues to have difficulty with balance, and has neck pain. No longer has tingling on hands. Never had weakness associated with symptoms. No bowel or bladder incontinence.   Patient Active Problem List   Diagnosis Date Noted  . Prediabetes 01/09/2016  . Dyslipidemia 10/03/2015  . Seasonal allergic rhinitis 04/29/2015  . History of esophageal stricture 01/28/2015  . Atypical nevus 01/27/2015  . Carpal tunnel syndrome 01/27/2015  . Insomnia, persistent 01/27/2015  . DDD (degenerative disc disease), cervical 01/27/2015  . Panic attacks 01/27/2015  . Gastric reflux 01/27/2015  . Headache, hemiplegic migraine 01/27/2015  . H/O transient cerebral ischemia 01/27/2015  . Hypertension, benign 01/27/2015  . Calculus of kidney 01/27/2015  . Adult BMI 30+ 01/27/2015  . Vitamin D deficiency 01/27/2015  . Snores 01/27/2015  . Aneurysm,  cerebral, nonruptured 05/12/2013    Past Surgical History:  Procedure Laterality Date  . HYSTEROSCOPY W/D&C N/A 02/07/2015   Procedure: DILATATION AND CURETTAGE /HYSTEROSCOPY;  Surgeon: Will Bonnet, MD;  Location: [facility] ORS;  Service: Gynecology;  Laterality: N/A;  . HYSTEROSCOPY WITH NOVASURE N/A 02/07/2015   Procedure: HYSTEROSCOPY WITH NOVASURE;  Surgeon: Will Bonnet, MD;  Location: [facility] ORS;  Service: Gynecology;  Laterality: N/A;  . TOOTH EXTRACTION    . TUBAL LIGATION  1989    Family History  Problem Relation Age of Onset  . Asthma Mother   . Heart disease Mother   . Hyperlipidemia Mother   . Emphysema Mother   . Tremor Mother   . Macular degeneration Mother   . Asthma Son   . Heart attack Father   . Urolithiasis Son   . Tremor Sister   . Tremor Brother   . Macular degeneration Maternal Grandmother   . Breast cancer Neg Hx     Social History   Social History  . Marital status: Married    Spouse name: Merry Proud  . Number of children: 2  . Years of education: 12th   Occupational History  . Medical records [employer]   Social History Main Topics  . Smoking status: Never Smoker  . Smokeless tobacco: Never Used  . Alcohol use No  . Drug use: No  . Sexual activity: Yes    Partners: Male     Comment: BTL   Other Topics Concern  . Not on file  Social History Narrative   Patient lives at home with husband Merry Proud.    Patient works at [employer].    Patient has a high school education.    Patient has 2 children.      Current Outpatient Prescriptions:  .  ALPRAZolam (XANAX) 0.5 MG tablet, Take 1 tablet (0.5 mg total) by mouth daily as needed for anxiety (05-1 tablet)., Disp: 30 tablet, Rfl: 0 .  aspirin-acetaminophen-caffeine (EXCEDRIN MIGRAINE) 250-250-65 MG per tablet, Take 2 tablets by mouth every 6 (six) hours as needed for pain., Disp: , Rfl:  .  cyclobenzaprine (FLEXERIL) 10 MG tablet, Take 1 tablet (10 mg total) by mouth at  bedtime. (Patient taking differently: Take 10 mg by mouth at bedtime as needed. ), Disp: 90 tablet, Rfl: 0 .  hydrochlorothiazide (HYDRODIURIL) 25 MG tablet, TAKE 1 TABLET (25 MG TOTAL) BY MOUTH DAILY., Disp: 90 tablet, Rfl: 1 .  Multiple Vitamins-Minerals (CENTRUM SILVER ADULT 50+ PO), Take 1 tablet by mouth daily., Disp: , Rfl:  .  nortriptyline (PAMELOR) 10 MG capsule, Take 1 capsule (10 mg total) by mouth at bedtime., Disp: 90 capsule, Rfl: 1 .  omeprazole (PRILOSEC) 20 MG capsule, TAKE 1 CAPSULE (20 MG TOTAL) BY MOUTH DAILY., Disp: 90 capsule, Rfl: 1 .  sodium chloride (OCEAN) 0.65 % nasal spray, Place 1 spray into both nostrils daily as needed (for allergies). , Disp: , Rfl:  .  traZODone (DESYREL) 50 MG tablet, Take 1 tablet (50 mg total) by mouth at bedtime., Disp: 90 tablet, Rfl: 1 .  Vitamin D, Ergocalciferol, (DRISDOL) 50000 units CAPS capsule, TAKE 1 CAPSULE (50,000 UNITS TOTAL) BY MOUTH EVERY 7 (SEVEN) DAYS., Disp: 12 capsule, Rfl: 0  Allergies  Allergen Reactions  . Claritin  [Loratadine] Other (See Comments)  . Codeine Other (See Comments)    Severe headache   . Entex Lq  [Phenylephrine-Guaifenesin] Other (See Comments)  . Fexofenadine Other (See Comments)  . Loratadine-Pseudoephedrine Er Other (See Comments)  . Pseudoephedrine Other (See Comments)  . Tramadol Nausea Only     ROS  Ten systems reviewed and is negative except as mentioned in HPI   Objective  Vitals:   04/08/16 0844  BP: (!) 144/86  Pulse: 71  Resp: 18  Temp: 98 F (36.7 C)  TempSrc: Oral  SpO2: 97%  Weight: 191 lb 3.2 oz (86.7 kg)  Height: 5\' 2"  (1.575 m)    Body mass index is 34.97 kg/m.  Physical Exam  Constitutional: Patient appears well-developed and well-nourished. Obese  No distress.  HEENT: head atraumatic, normocephalic, pupils equal and reactive to light, ears  Normal TM bilaterally  neck supple, throat within normal limits Cardiovascular: Normal rate, regular rhythm and normal  heart sounds.  No murmur heard. No BLE edema. Pulmonary/Chest: Effort normal and breath sounds normal. No respiratory distress. Abdominal: Soft.  There is no tenderness. Psychiatric: Patient has a normal mood and affect. behavior is normal. Judgment and thought content normal. Neurological: no nystagmus, unable to walk tandem and off balance even when walking, needs to hold on to the wall, or with a wide base gait, normal grip, normal sensation Muscular skeletal: pain during palpation of nuchal area and trapezium muscle  PHQ2/9: Depression screen [facility] 2/9 04/08/2016 01/09/2016 10/03/2015 06/06/2015 04/29/2015  Decreased Interest 0 0 0 0 0  Down, Depressed, Hopeless 0 0 0 0 0  PHQ - 2 Score 0 0 0 0 0     Fall Risk: Fall Risk  04/08/2016 01/09/2016 10/03/2015 06/06/2015  04/29/2015  Falls in the past year? No No No No No     Functional Status Survey: Is the patient deaf or have difficulty hearing?: No Does the patient have difficulty seeing, even when wearing glasses/contacts?: No Does the patient have difficulty concentrating, remembering, or making decisions?: No Does the patient have difficulty walking or climbing stairs?: No Does the patient have difficulty dressing or bathing?: No Does the patient have difficulty doing errands alone such as visiting a doctor's office or shopping?: No    Assessment & Plan   1. Arm numbness  - MR Brain Wo Contrast; Future - MR Cervical Spine Wo Contrast; Future  2. Nausea  It may be related to pain  3. Gait disturbance  - MR Brain Wo Contrast; Future - MR Cervical Spine Wo Contrast; Future  4. Loss of equilibrium  - MR Brain Wo Contrast; Future - MR Cervical Spine Wo Contrast; Future  5. History of TIA (transient ischemic attack)  - MR Brain Wo Contrast; Future - MR Cervical Spine Wo Contrast; Future  6. Neck pain  - MR Brain Wo Contrast; Future - MR Cervical Spine Wo Contrast; Future  We tried scheduling MRI for today/STAT but not  openings until Friday at [facility], [facility] or [facility]. Advised to go to [facility] for further evaluation. She agreed in going to [facility] right now. Her daughter-in-law will transport her by private vehicle. Spoke to Ten Sleep , triage nurse and she is expecting patient  Patient went to [facility] had CTA, MRI and found to have cervical spinal stenosis. I will send sent rx of prednisone taper to her pharmacy

## 2016-04-08 NOTE — ED Notes (Signed)
Pt to MRI

## 2016-04-08 NOTE — Discharge Instructions (Signed)
Please seek medical attention for any high fevers, chest pain, shortness of breath, change in behavior, persistent vomiting, bloody stool or any other new or concerning symptoms.  

## 2016-04-08 NOTE — Addendum Note (Signed)
Addended by: Steele Sizer F on: 04/08/2016 10:38 PM   Modules accepted: Orders

## 2016-04-08 NOTE — ED Notes (Signed)
Pt completed screening questions with MRI staff.

## 2016-04-08 NOTE — ED Notes (Signed)
Pain to left side of neck when turning to left. Dizziness, nausea and generalized "not feeling like herself" since event occurred yesterday.

## 2016-04-08 NOTE — ED Triage Notes (Signed)
Pt states while at work yesterday she turned her head to the left and had a sudden pain to the back of the neck into the head, states since that she has had dizziness with N/V, "like motion sickness".Marland Kitchen

## 2016-04-08 NOTE — ED Notes (Signed)
MD at bedside. 

## 2016-04-08 NOTE — ED Provider Notes (Signed)
[facility] Emergency Department Provider Note   ____________________________________________   First MD Initiated Contact with Patient 04/08/16 1233     (approximate)  I have reviewed the triage vital signs and the nursing notes.   HISTORY  Chief Complaint Dizziness; Nausea; and Emesis    HPI Monique Daniels is a 55 y.o. female  who reports that yesterday at work she turned her head and experienced sudden shooting pain from the back of her head down into her neck. Patient has continued to have severe pain in the back of her head and neck. She also now has numbness and tingling in both arms going down into all of her fingers. She does not have any weakness. She also has a wide-based ataxic gait per her her friend is with her and her doctor who called me and spoke to me about it. Her going to order an MRI of her head and neck. Patient has a history of TIAs in hemiplegic migraines. Pain that she is having currently is in the back of her neck along the spine. It is not in the anterior neck at all.   Past Medical History:  Diagnosis Date  . Allergic rhinitis, cause unspecified   . Benign neoplasm of skin, site unspecified   . Calculus of kidney    stones  . Carpal tunnel syndrome   . Degeneration of cervical intervertebral disc   . Dizziness and giddiness   . Enthesopathy of hip region   . Esophageal reflux   . Essential hypertension, malignant   . Irregular menstrual cycle   . Migraine, unspecified, without mention of intractable migraine without mention of status migrainosus   . Obesity, unspecified   . Other dyspnea and respiratory abnormality   . Other malaise and fatigue   . Ovarian cyst   . Palpitations   . Symptomatic menopausal or female climacteric states   . Transient ischemic attack (TIA), and cerebral infarction without residual deficits   . Unspecified vitamin D deficiency     Patient Active Problem List   Diagnosis Date Noted  .  Prediabetes 01/09/2016  . Dyslipidemia 10/03/2015  . Seasonal allergic rhinitis 04/29/2015  . History of esophageal stricture 01/28/2015  . Atypical nevus 01/27/2015  . Carpal tunnel syndrome 01/27/2015  . Insomnia, persistent 01/27/2015  . DDD (degenerative disc disease), cervical 01/27/2015  . Panic attacks 01/27/2015  . Gastric reflux 01/27/2015  . Headache, hemiplegic migraine 01/27/2015  . H/O transient cerebral ischemia 01/27/2015  . Hypertension, benign 01/27/2015  . Calculus of kidney 01/27/2015  . Adult BMI 30+ 01/27/2015  . Vitamin D deficiency 01/27/2015  . Snores 01/27/2015  . Aneurysm, cerebral, nonruptured 05/12/2013    Past Surgical History:  Procedure Laterality Date  . HYSTEROSCOPY W/D&C N/A 02/07/2015   Procedure: DILATATION AND CURETTAGE /HYSTEROSCOPY;  Surgeon: Will Bonnet, MD;  Location: [facility] ORS;  Service: Gynecology;  Laterality: N/A;  . HYSTEROSCOPY WITH NOVASURE N/A 02/07/2015   Procedure: HYSTEROSCOPY WITH NOVASURE;  Surgeon: Will Bonnet, MD;  Location: [facility] ORS;  Service: Gynecology;  Laterality: N/A;  . TOOTH EXTRACTION    . TUBAL LIGATION  1989    Prior to Admission medications   Medication Sig Start Date End Date Taking? Authorizing Provider  ALPRAZolam Duanne Moron) 0.5 MG tablet Take 1 tablet (0.5 mg total) by mouth daily as needed for anxiety (05-1 tablet). 10/03/15   Steele Sizer, MD  aspirin-acetaminophen-caffeine (EXCEDRIN MIGRAINE) [phone] MG per tablet Take 2 tablets by mouth every 6 (  six) hours as needed for pain.    Historical Provider, MD  cyclobenzaprine (FLEXERIL) 10 MG tablet Take 1 tablet (10 mg total) by mouth at bedtime. Patient taking differently: Take 10 mg by mouth at bedtime as needed.  01/28/15   Steele Sizer, MD  hydrochlorothiazide (HYDRODIURIL) 25 MG tablet TAKE 1 TABLET (25 MG TOTAL) BY MOUTH DAILY. 03/28/16   Steele Sizer, MD  Multiple Vitamins-Minerals (CENTRUM SILVER ADULT 50+ PO) Take 1 tablet by mouth daily.     Historical Provider, MD  nortriptyline (PAMELOR) 10 MG capsule Take 1 capsule (10 mg total) by mouth at bedtime. 10/03/15   Steele Sizer, MD  omeprazole (PRILOSEC) 20 MG capsule TAKE 1 CAPSULE (20 MG TOTAL) BY MOUTH DAILY. 03/28/16   Steele Sizer, MD  sodium chloride (OCEAN) 0.65 % nasal spray Place 1 spray into both nostrils daily as needed (for allergies).     Historical Provider, MD  traZODone (DESYREL) 50 MG tablet Take 1 tablet (50 mg total) by mouth at bedtime. 01/28/15   Steele Sizer, MD  Vitamin D, Ergocalciferol, (DRISDOL) 50000 units CAPS capsule TAKE 1 CAPSULE (50,000 UNITS TOTAL) BY MOUTH EVERY 7 (SEVEN) DAYS. 01/30/16   Steele Sizer, MD    Allergies Claritin  [loratadine]; Codeine; Entex lq  [phenylephrine-guaifenesin]; Fexofenadine; Loratadine-pseudoephedrine er; Pseudoephedrine; and Tramadol  Family History  Problem Relation Age of Onset  . Asthma Mother   . Heart disease Mother   . Hyperlipidemia Mother   . Emphysema Mother   . Tremor Mother   . Macular degeneration Mother   . Asthma Son   . Heart attack Father   . Urolithiasis Son   . Tremor Sister   . Tremor Brother   . Macular degeneration Maternal Grandmother   . Breast cancer Neg Hx     Social History Social History  Substance Use Topics  . Smoking status: Never Smoker  . Smokeless tobacco: Never Used  . Alcohol use No    Review of Systems Constitutional: No fever/chills Eyes: No visual changes. ENT: No sore throat. Cardiovascular: Denies chest pain. Respiratory: Denies shortness of breath. Gastrointestinal: No abdominal pain.  No nausea, no vomiting.  No diarrhea.  No constipation. Genitourinary: Negative for dysuria. Musculoskeletal: Negative for back pain. Skin: Negative for rash. Neurological: Negative for  focal weakness   10-point ROS otherwise negative.  ____________________________________________   PHYSICAL EXAM:  VITAL SIGNS: ED Triage Vitals  Enc Vitals Group     BP 04/08/16  1034 (!) 152/81     Pulse Rate 04/08/16 1034 (!) 59     Resp 04/08/16 1034 20     Temp 04/08/16 1034 98.1 F (36.7 C)     Temp Source 04/08/16 1034 Oral     SpO2 04/08/16 1034 98 %     Weight 04/08/16 1035 191 lb (86.6 kg)     Height 04/08/16 1035 5\' 2"  (1.575 m)     Head Circumference --      Peak Flow --      Pain Score 04/08/16 1041 6     Pain Loc --      Pain Edu? --      Excl. in [place]? --     Constitutional: Alert and oriented. Well appearing and in no acute distress. Eyes: Conjunctivae are normal. PERRL. EOMI. Head: Atraumatic. Nose: No congestion/rhinnorhea. Mouth/Throat: Mucous membranes are moist.  Oropharynx non-erythematous. Neck: No stridor.  Diffuse cervical spine tenderness to palpation. Hematological/Lymphatic/Immunilogical: No cervical lymphadenopathy. No tenderness over the carotids. Cardiovascular: Normal rate,  regular rhythm. Grossly normal heart sounds.  Good peripheral circulation. Respiratory: Normal respiratory effort.  No retractions. Lungs CTAB. Gastrointestinal: Soft and nontender. No distention. No abdominal bruits. No CVA tenderness. Musculoskeletal: No lower extremity tenderness nor edema.  No joint effusions. Neurologic:  Normal speech and language. No gross focal neurologic deficits are appreciated. Cranial nerves II through XII are intact cerebellar finger-nose rapid alternating movements in the hands are normal motor strength is 5 over 5 throughout. Patient reports numbness and tingling as noted in history of present illness. Gait is wide based and ataxic as reported. Skin:  Skin is warm, dry and intact. No rash noted. Psychiatric: Mood and affect are normal. Speech and behavior are normal.  ____________________________________________   LABS (all labs ordered are listed, but only abnormal results are displayed)  Labs Reviewed  URINALYSIS COMPLETEWITH MICROSCOPIC ([place]) - Abnormal; Notable for the following:       Result Value   Color,  Urine YELLOW (*)    APPearance CLEAR (*)    Bacteria, UA RARE (*)    Squamous Epithelial / LPF 0-5 (*)    All other components within normal limits  BASIC METABOLIC PANEL  CBC  DIFFERENTIAL  TROPONIN I  HEPATIC FUNCTION PANEL   ____________________________________________  EKG  EKG read and interpreted by me shows normal sinus rhythm rate of 61 normal axis no acute changes ____________________________________________  RADIOLOGY   ____________________________________________   PROCEDURES  Procedure(s) performed:   Procedures  Critical Care performed:   ____________________________________________   INITIAL IMPRESSION / ASSESSMENT AND PLAN / ED COURSE  Pertinent labs & imaging results that were available during my care of the patient were reviewed by me and considered in my medical decision making (see chart for details).    Clinical Course   MRI labs pending patient will be signed out to Dr. Archie Balboa  ____________________________________________   FINAL CLINICAL IMPRESSION(S) / ED DIAGNOSES  Final diagnoses:  Dizziness      NEW MEDICATIONS STARTED DURING THIS VISIT:  New Prescriptions   No medications on file     Note:  This document was prepared using Dragon voice recognition software and may include unintentional dictation errors.    Nena Polio, MD 04/08/16 [phone]

## 2016-04-08 NOTE — ED Notes (Addendum)
Pt still at MRI

## 2016-04-08 NOTE — ED Notes (Signed)
Md at bedside. Pt able to ambulate in room independently. Pt husband at bedside, reports that gait is normal. Pt able to drink soda that was given.

## 2016-04-10 ENCOUNTER — Ambulatory Visit: Payer: BLUE CROSS/BLUE SHIELD

## 2016-04-13 ENCOUNTER — Encounter: Payer: Self-pay | Admitting: Family Medicine

## 2016-04-13 ENCOUNTER — Ambulatory Visit (INDEPENDENT_AMBULATORY_CARE_PROVIDER_SITE_OTHER): Payer: BLUE CROSS/BLUE SHIELD | Admitting: Family Medicine

## 2016-04-13 VITALS — BP 130/86 | HR 80 | Temp 98.2°F | Resp 18 | Ht 62.0 in | Wt 192.8 lb

## 2016-04-13 DIAGNOSIS — M542 Cervicalgia: Secondary | ICD-10-CM

## 2016-04-13 DIAGNOSIS — R42 Dizziness and giddiness: Secondary | ICD-10-CM | POA: Diagnosis not present

## 2016-04-13 DIAGNOSIS — M4802 Spinal stenosis, cervical region: Secondary | ICD-10-CM

## 2016-04-13 MED ORDER — CYCLOBENZAPRINE HCL 10 MG PO TABS: 5.0000 mg | ORAL_TABLET | Freq: Every evening | ORAL | 0 refills | Status: DC | PRN

## 2016-04-13 NOTE — Progress Notes (Signed)
Name: Monique Daniels   MRN: [id number]    DOB: [birth year]   Date:04/13/2016       Progress Note  Subjective  Chief Complaint  Chief Complaint  Patient presents with  . Referral    [facility]. Patient went to the ER due to needing Brain MRI and states the Prednisone given has helped patient symptoms alot. Except she is having a slight discomfort in her lower back    HPI  Cervical pain / Dizziness: she was sent to [facility] after her visit with me last week on 04/08/2016. She was discharge home since MRI brain, MRA no changes but once I reviewed results and found cervical spine stenosis she was given prednisone taper and has been feeling better. Symptoms improved within two days. Pain on her neck has resolved, no longer dizzy. Right now only has a mild pain on her lower back/ spasms. No weakness, tingling, able to drive, no bowel or bladder incontinence.   Patient Active Problem List   Diagnosis Date Noted  . Cervical spinal stenosis 04/08/2016  . Prediabetes 01/09/2016  . Dyslipidemia 10/03/2015  . Seasonal allergic rhinitis 04/29/2015  . History of esophageal stricture 01/28/2015  . Atypical nevus 01/27/2015  . Carpal tunnel syndrome 01/27/2015  . Insomnia, persistent 01/27/2015  . DDD (degenerative disc disease), cervical 01/27/2015  . Panic attacks 01/27/2015  . Gastric reflux 01/27/2015  . Headache, hemiplegic migraine 01/27/2015  . H/O transient cerebral ischemia 01/27/2015  . Hypertension, benign 01/27/2015  . Calculus of kidney 01/27/2015  . Adult BMI 30+ 01/27/2015  . Vitamin D deficiency 01/27/2015  . Snores 01/27/2015  . Aneurysm, cerebral, nonruptured 05/12/2013    Past Surgical History:  Procedure Laterality Date  . HYSTEROSCOPY W/D&C N/A 02/07/2015   Procedure: DILATATION AND CURETTAGE /HYSTEROSCOPY;  Surgeon: Will Bonnet, MD;  Location: [facility] ORS;  Service: Gynecology;  Laterality: N/A;  . HYSTEROSCOPY WITH NOVASURE N/A 02/07/2015   Procedure:  HYSTEROSCOPY WITH NOVASURE;  Surgeon: Will Bonnet, MD;  Location: [facility] ORS;  Service: Gynecology;  Laterality: N/A;  . TOOTH EXTRACTION    . TUBAL LIGATION  1989    Family History  Problem Relation Age of Onset  . Asthma Mother   . Heart disease Mother   . Hyperlipidemia Mother   . Emphysema Mother   . Tremor Mother   . Macular degeneration Mother   . Asthma Son   . Heart attack Father   . Urolithiasis Son   . Tremor Sister   . Tremor Brother   . Macular degeneration Maternal Grandmother   . Breast cancer Neg Hx     Social History   Social History  . Marital status: Married    Spouse name: Merry Proud  . Number of children: 2  . Years of education: 12th   Occupational History  . Medical records [employer]   Social History Main Topics  . Smoking status: Never Smoker  . Smokeless tobacco: Never Used  . Alcohol use No  . Drug use: No  . Sexual activity: Yes    Partners: Male     Comment: BTL   Other Topics Concern  . Not on file   Social History Narrative   Patient lives at home with husband Merry Proud.    Patient works at [employer].    Patient has a high school education.    Patient has 2 children.      Current Outpatient Prescriptions:  .  ALPRAZolam (XANAX) 0.5 MG tablet, Take  1 tablet (0.5 mg total) by mouth daily as needed for anxiety (05-1 tablet)., Disp: 30 tablet, Rfl: 0 .  aspirin-acetaminophen-caffeine (EXCEDRIN MIGRAINE) 250-250-65 MG per tablet, Take 2 tablets by mouth every 6 (six) hours as needed for pain., Disp: , Rfl:  .  cyclobenzaprine (FLEXERIL) 10 MG tablet, Take 1 tablet (10 mg total) by mouth at bedtime. (Patient taking differently: Take 10 mg by mouth at bedtime as needed. ), Disp: 90 tablet, Rfl: 0 .  hydrochlorothiazide (HYDRODIURIL) 25 MG tablet, TAKE 1 TABLET (25 MG TOTAL) BY MOUTH DAILY., Disp: 90 tablet, Rfl: 1 .  Multiple Vitamins-Minerals (CENTRUM SILVER ADULT 50+ PO), Take 1 tablet by mouth daily., Disp: , Rfl:   .  nortriptyline (PAMELOR) 10 MG capsule, Take 1 capsule (10 mg total) by mouth at bedtime., Disp: 90 capsule, Rfl: 1 .  omeprazole (PRILOSEC) 20 MG capsule, TAKE 1 CAPSULE (20 MG TOTAL) BY MOUTH DAILY., Disp: 90 capsule, Rfl: 1 .  predniSONE (STERAPRED UNI-PAK 48 TAB) 10 MG (48) TBPK tablet, Take by mouth daily., Disp: 48 tablet, Rfl: 0 .  sodium chloride (OCEAN) 0.65 % nasal spray, Place 1 spray into both nostrils daily as needed (for allergies). , Disp: , Rfl:  .  traZODone (DESYREL) 50 MG tablet, Take 1 tablet (50 mg total) by mouth at bedtime., Disp: 90 tablet, Rfl: 1 .  Vitamin D, Ergocalciferol, (DRISDOL) 50000 units CAPS capsule, TAKE 1 CAPSULE (50,000 UNITS TOTAL) BY MOUTH EVERY 7 (SEVEN) DAYS., Disp: 12 capsule, Rfl: 0  Allergies  Allergen Reactions  . Claritin  [Loratadine] Other (See Comments)  . Codeine Other (See Comments)    Severe headache   . Entex Lq  [Phenylephrine-Guaifenesin] Other (See Comments)  . Fexofenadine Other (See Comments)  . Loratadine-Pseudoephedrine Er Other (See Comments)  . Pseudoephedrine Other (See Comments)  . Tramadol Nausea Only     ROS  Ten systems reviewed and is negative except as mentioned in HPI   Objective  Vitals:   04/13/16 0814  BP: 130/86  Pulse: 80  Resp: 18  Temp: 98.2 F (36.8 C)  TempSrc: Oral  SpO2: 96%  Weight: 192 lb 12.8 oz (87.5 kg)  Height: _0  (1.575 m)    Body mass index is 35.26 kg/m.  Physical Exam  Constitutional: Patient appears well-developed and well-nourished. Obese No distress.  HEENT: head atraumatic, normocephalic, pupils equal and reactive to light, neck supple - but doing it slowly to not cause pain, throat within normal limits Cardiovascular: Normal rate, regular rhythm and normal heart sounds.  No murmur heard. No BLE edema. Pulmonary/Chest: Effort normal and breath sounds normal. No respiratory distress. Abdominal: Soft.  There is no tenderness. Psychiatric: Patient has a normal mood and  affect. behavior is normal. Judgment and thought content normal. Neurological: normal balance, normal grip  Recent Results (from the past 2160 hour(s))  Basic metabolic panel     Status: None   Collection Time: 04/08/16 10:44 AM  Result Value Ref Range   Sodium 138 135 - 145 mmol/L   Potassium 3.6 3.5 - 5.1 mmol/L   Chloride 104 101 - 111 mmol/L   CO2 28 22 - 32 mmol/L   Glucose, Bld 93 65 - 99 mg/dL   BUN 11 6 - 20 mg/dL   Creatinine, Ser 0.82 0.44 - 1.00 mg/dL   Calcium 8.9 8.9 - 10.3 mg/dL   GFR calc non Af Amer >60 >60 mL/min   GFR calc Af Amer >60 >60 mL/min    Comment: (  NOTE) The eGFR has been calculated using the CKD EPI equation. This calculation has not been validated in all clinical situations. eGFR's persistently <60 mL/min signify possible Chronic Kidney Disease.    Anion gap 6 5 - 15  CBC     Status: None   Collection Time: 04/08/16 10:44 AM  Result Value Ref Range   WBC 6.5 3.6 - 11.0 K/uL   RBC 4.90 3.80 - 5.20 MIL/uL   Hemoglobin 14.7 12.0 - 16.0 g/dL   HCT 41.9 35.0 - 47.0 %   MCV 85.4 80.0 - 100.0 fL   MCH 30.1 26.0 - 34.0 pg   MCHC 35.2 32.0 - 36.0 g/dL   RDW 13.7 11.5 - 14.5 %   Platelets 251 150 - 440 K/uL  Urinalysis complete, with microscopic     Status: Abnormal   Collection Time: 04/08/16 10:44 AM  Result Value Ref Range   Color, Urine YELLOW (A) YELLOW   APPearance CLEAR (A) CLEAR   Glucose, UA NEGATIVE NEGATIVE mg/dL   Bilirubin Urine NEGATIVE NEGATIVE   Ketones, ur NEGATIVE NEGATIVE mg/dL   Specific Gravity, Urine 1.012 1.005 - 1.030   Hgb urine dipstick NEGATIVE NEGATIVE   pH 6.0 5.0 - 8.0   Protein, ur NEGATIVE NEGATIVE mg/dL   Nitrite NEGATIVE NEGATIVE   Leukocytes, UA NEGATIVE NEGATIVE   RBC / HPF 0-5 0 - 5 RBC/hpf   WBC, UA 0-5 0 - 5 WBC/hpf   Bacteria, UA RARE (A) NONE SEEN   Squamous Epithelial / LPF 0-5 (A) NONE SEEN  Troponin I     Status: None   Collection Time: 04/08/16 10:44 AM  Result Value Ref Range   Troponin I <0.03  <0.03 ng/mL  Differential     Status: None   Collection Time: 04/08/16 10:44 AM  Result Value Ref Range   Neutrophils Relative % 50 %   Neutro Abs 3.1 1.4 - 6.5 K/uL   Lymphocytes Relative 31 %   Lymphs Abs 1.9 1.0 - 3.6 K/uL   Monocytes Relative 9 %   Monocytes Absolute 0.5 0.2 - 0.9 K/uL   Eosinophils Relative 9 %   Eosinophils Absolute 0.5 0 - 0.7 K/uL   Basophils Relative 1 %   Basophils Absolute 0.1 0 - 0.1 K/uL  Hepatic function panel     Status: Abnormal   Collection Time: 04/08/16 10:44 AM  Result Value Ref Range   Total Protein 7.3 6.5 - 8.1 g/dL   Albumin 3.8 3.5 - 5.0 g/dL   AST 21 15 - 41 U/L   ALT 17 14 - 54 U/L   Alkaline Phosphatase 57 38 - 126 U/L   Total Bilirubin <0.1 (L) 0.3 - 1.2 mg/dL   Bilirubin, Direct <0.1 (L) 0.1 - 0.5 mg/dL   Indirect Bilirubin NOT CALCULATED 0.3 - 0.9 mg/dL    PHQ2/9: Depression screen [facility] 2/9 04/08/2016 01/09/2016 10/03/2015 06/06/2015 04/29/2015  Decreased Interest 0 0 0 0 0  Down, Depressed, Hopeless 0 0 0 0 0  PHQ - 2 Score 0 0 0 0 0    Fall Risk: Fall Risk  04/08/2016 01/09/2016 10/03/2015 06/06/2015 04/29/2015  Falls in the past year? _0      Assessment & Plan  1. Cervical spinal stenosis  - cyclobenzaprine (FLEXERIL) 10 MG tablet; Take 0.5-1 tablets (5-10 mg total) by mouth at bedtime as needed.  Dispense: 30 tablet; Refill: 0 - Ambulatory referral to Neurosurgery  2. Dizziness  Resolved, it may have been triggered  by migraine  3. Cervical pain  - cyclobenzaprine (FLEXERIL) 10 MG tablet; Take 0.5-1 tablets (5-10 mg total) by mouth at bedtime as needed.  Dispense: 30 tablet; Refill: 0

## 2016-04-26 ENCOUNTER — Other Ambulatory Visit: Payer: Self-pay | Admitting: Family Medicine

## 2016-04-28 ENCOUNTER — Other Ambulatory Visit: Payer: Self-pay | Admitting: Family Medicine

## 2016-04-28 DIAGNOSIS — G43409 Hemiplegic migraine, not intractable, without status migrainosus: Secondary | ICD-10-CM

## 2016-06-30 ENCOUNTER — Encounter: Payer: Self-pay | Admitting: Family Medicine

## 2016-06-30 ENCOUNTER — Ambulatory Visit (INDEPENDENT_AMBULATORY_CARE_PROVIDER_SITE_OTHER): Payer: BLUE CROSS/BLUE SHIELD | Admitting: Family Medicine

## 2016-06-30 VITALS — BP 122/68 | HR 78 | Temp 98.1°F | Resp 16 | Ht 62.0 in | Wt 188.2 lb

## 2016-06-30 DIAGNOSIS — M5416 Radiculopathy, lumbar region: Secondary | ICD-10-CM

## 2016-06-30 DIAGNOSIS — E8881 Metabolic syndrome: Secondary | ICD-10-CM | POA: Diagnosis not present

## 2016-06-30 DIAGNOSIS — M4802 Spinal stenosis, cervical region: Secondary | ICD-10-CM | POA: Diagnosis not present

## 2016-06-30 DIAGNOSIS — Z23 Encounter for immunization: Secondary | ICD-10-CM | POA: Diagnosis not present

## 2016-06-30 DIAGNOSIS — I10 Essential (primary) hypertension: Secondary | ICD-10-CM

## 2016-06-30 DIAGNOSIS — R7303 Prediabetes: Secondary | ICD-10-CM

## 2016-06-30 MED ORDER — CYCLOBENZAPRINE HCL 10 MG PO TABS: 5.0000 mg | ORAL_TABLET | Freq: Every evening | ORAL | 0 refills | Status: DC | PRN

## 2016-06-30 MED ORDER — PREDNISONE 10 MG (48) PO TBPK: ORAL_TABLET | Freq: Every day | ORAL | 0 refills | Status: DC

## 2016-06-30 NOTE — Progress Notes (Signed)
Name: Monique Daniels   MRN: [id number]    DOB: 11-Mar-1961   Date:06/30/2016       Progress Note  Subjective  Chief Complaint  Chief Complaint  Patient presents with  . Hip Pain    right side     HPI   Low back pain with radiculitis: she states symptoms started a couple months ago, she states it started shortly after she started walking. She states pain is described as dull aching on right buttocks with periods of exacerbation, at times piercing and shoots down right outer thigh, in front on her knee and down to medial ankle. No bowel or bladder incontinence, no saddle anesthesia.   HTN: patient is  taking medication daily, denies side effects. No chest pain, or palpitation.   GERD: under control with medication, no heartburn, no regurgitation since esophageal dilation 2016, down to one Prilosec daily   Migraine: hemiplegic, she is no longer taking Topamax, back on Pamelor at night, symptoms are not as frequent now, last episodes was months ago She has a lot of nuchal pain secondary to DDD cervical spine. She states she was having too much tingling on Topamax and she is no longer taking it.   Metabolic Syndrome: she is doing well, losing weight, denies polyphagia, polydipsia or polyuria.   Spinal stenosis: referral made to neurosurgeon but she never got a phone call. She states neck pain has improved, no longer has radiculitis.    Patient Active Problem List   Diagnosis Date Noted  . Cervical spinal stenosis 04/08/2016  . Prediabetes 01/09/2016  . Dyslipidemia 10/03/2015  . Seasonal allergic rhinitis 04/29/2015  . History of esophageal stricture 01/28/2015  . Atypical nevus 01/27/2015  . Carpal tunnel syndrome 01/27/2015  . Insomnia, persistent 01/27/2015  . DDD (degenerative disc disease), cervical 01/27/2015  . Panic attacks 01/27/2015  . Gastric reflux 01/27/2015  . Headache, hemiplegic migraine 01/27/2015  . H/O transient cerebral ischemia 01/27/2015  .  Hypertension, benign 01/27/2015  . Calculus of kidney 01/27/2015  . Adult BMI 30+ 01/27/2015  . Vitamin D deficiency 01/27/2015  . Snores 01/27/2015    Past Surgical History:  Procedure Laterality Date  . HYSTEROSCOPY W/D&C N/A 02/07/2015   Procedure: DILATATION AND CURETTAGE /HYSTEROSCOPY;  Surgeon: Will Bonnet, MD;  Location: [facility] ORS;  Service: Gynecology;  Laterality: N/A;  . HYSTEROSCOPY WITH NOVASURE N/A 02/07/2015   Procedure: HYSTEROSCOPY WITH NOVASURE;  Surgeon: Will Bonnet, MD;  Location: [facility] ORS;  Service: Gynecology;  Laterality: N/A;  . TOOTH EXTRACTION    . TUBAL LIGATION  1989    Family History  Problem Relation Age of Onset  . Asthma Mother   . Heart disease Mother   . Hyperlipidemia Mother   . Emphysema Mother   . Tremor Mother   . Macular degeneration Mother   . Asthma Son   . Heart attack Father   . Urolithiasis Son   . Tremor Sister   . Tremor Brother   . Macular degeneration Maternal Grandmother   . Breast cancer Neg Hx     Social History   Social History  . Marital status: Married    Spouse name: Merry Proud  . Number of children: 2  . 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 Smoking status: Never Smoker  . Smokeless tobacco: Never Used  . Alcohol use No  . Drug use: No  . Sexual activity:  Yes    Partners: Male     Comment: BTL   Other Topics Concern  . Not on file   Social History Narrative   Patient lives at home with husband Merry Proud.    Patient works at [employer].    Patient has a high school education.    Patient has 2 children.      Current Outpatient Prescriptions:  .  ALPRAZolam (XANAX) 0.5 MG tablet, Take 1 tablet (0.5 mg total) by mouth daily as needed for anxiety (05-1 tablet)., Disp: 30 tablet, Rfl: 0 .  aspirin-acetaminophen-caffeine (EXCEDRIN MIGRAINE) 250-250-65 MG per tablet, Take 2 tablets by mouth every 6 (six) hours  as needed for pain., Disp: , Rfl:  .  cyclobenzaprine (FLEXERIL) 10 MG tablet, Take 0.5-1 tablets (5-10 mg total) by mouth at bedtime as needed., Disp: 30 tablet, Rfl: 0 .  hydrochlorothiazide (HYDRODIURIL) 25 MG tablet, TAKE 1 TABLET (25 MG TOTAL) BY MOUTH DAILY., Disp: 90 tablet, Rfl: 1 .  Multiple Vitamins-Minerals (CENTRUM SILVER ADULT 50+ PO), Take 1 tablet by mouth daily., Disp: , Rfl:  .  nortriptyline (PAMELOR) 10 MG capsule, TAKE 1 CAPSULE (10 MG TOTAL) BY MOUTH AT BEDTIME., Disp: 90 capsule, Rfl: 1 .  omeprazole (PRILOSEC) 20 MG capsule, TAKE 1 CAPSULE (20 MG TOTAL) BY MOUTH DAILY., Disp: 90 capsule, Rfl: 1 .  predniSONE (STERAPRED UNI-PAK 48 TAB) 10 MG (48) TBPK tablet, Take by mouth daily., Disp: 48 tablet, Rfl: 0 .  sodium chloride (OCEAN) 0.65 % nasal spray, Place 1 spray into both nostrils daily as needed (for allergies). , Disp: , Rfl:  .  traZODone (DESYREL) 50 MG tablet, Take 1 tablet (50 mg total) by mouth at bedtime., Disp: 90 tablet, Rfl: 1 .  Vitamin D, Ergocalciferol, (DRISDOL) 50000 units CAPS capsule, TAKE 1 CAPSULE (50,000 UNITS TOTAL) BY MOUTH EVERY 7 (SEVEN) DAYS., Disp: 12 capsule, Rfl: 0  Allergies  Allergen Reactions  . Claritin  [Loratadine] Other (See Comments)  . Codeine Other (See Comments)    Severe headache   . Entex Lq  [Phenylephrine-Guaifenesin] Other (See Comments)  . Fexofenadine Other (See Comments)  . Loratadine-Pseudoephedrine Er Other (See Comments)  . Pseudoephedrine Other (See Comments)  . Tramadol Nausea Only     ROS  Constitutional: Negative for fever or significant weight change.  Respiratory: Negative for cough and shortness of breath.   Cardiovascular: Negative for chest pain or palpitations.  Gastrointestinal: Negative for abdominal pain, no bowel changes.  Musculoskeletal: Negative for gait problem or joint swelling.  Skin: Negative for rash.  Neurological: Negative for dizziness or headache.  No other specific complaints in a  complete review of systems (except as listed in HPI above).  Objective  Vitals:   06/30/16 1506  BP: 122/68  Pulse: 78  Resp: 16  Temp: 98.1 F (36.7 C)  TempSrc: Oral  SpO2: 98%  Weight: 188 lb 3 oz (85.4 kg)  Height: _0  (1.575 m)    Body mass index is 34.42 kg/m.  Physical Exam  Constitutional: Patient appears well-developed and well-nourished. Obese  No distress.  HEENT: head atraumatic, normocephalic, pupils equal and reactive to light,  neck supple, throat within normal limits Cardiovascular: Normal rate, regular rhythm and normal heart sounds.  No murmur heard. No BLE edema. Pulmonary/Chest: Effort normal and breath sounds normal. No respiratory distress. Abdominal: Soft.  There is no tenderness. Psychiatric: Patient has a normal mood and affect. behavior is normal. Judgment and thought content normal. Muscular Skeletal: pain  during palpation of right lower back and right buttocks, negative straight leg raise, pain with left lateral bending and extension of spine   Recent Results (from the past 2160 hour(s))  Basic metabolic panel     Status: None   Collection Time: 04/08/16 10:44 AM  Result Value Ref Range   Sodium 138 135 - 145 mmol/L   Potassium 3.6 3.5 - 5.1 mmol/L   Chloride 104 101 - 111 mmol/L   CO2 28 22 - 32 mmol/L   Glucose, Bld 93 65 - 99 mg/dL   BUN 11 6 - 20 mg/dL   Creatinine, Ser 0.82 0.44 - 1.00 mg/dL   Calcium 8.9 8.9 - 10.3 mg/dL   GFR calc non Af Amer >60 >60 mL/min   GFR calc Af Amer >60 >60 mL/min    Comment: (NOTE) The eGFR has been calculated using the CKD EPI equation. This calculation has not been validated in all clinical situations. eGFR's persistently <60 mL/min signify possible Chronic Kidney Disease.    Anion gap 6 5 - 15  CBC     Status: None   Collection Time: 04/08/16 10:44 AM  Result Value Ref Range   WBC 6.5 3.6 - 11.0 K/uL   RBC 4.90 3.80 - 5.20 MIL/uL   Hemoglobin 14.7 12.0 - 16.0 g/dL   HCT 41.9 35.0 - 47.0 %    MCV 85.4 80.0 - 100.0 fL   MCH 30.1 26.0 - 34.0 pg   MCHC 35.2 32.0 - 36.0 g/dL   RDW 13.7 11.5 - 14.5 %   Platelets 251 150 - 440 K/uL  Urinalysis complete, with microscopic     Status: Abnormal   Collection Time: 04/08/16 10:44 AM  Result Value Ref Range   Color, Urine YELLOW (A) YELLOW   APPearance CLEAR (A) CLEAR   Glucose, UA NEGATIVE NEGATIVE mg/dL   Bilirubin Urine NEGATIVE NEGATIVE   Ketones, ur NEGATIVE NEGATIVE mg/dL   Specific Gravity, Urine 1.012 1.005 - 1.030   Hgb urine dipstick NEGATIVE NEGATIVE   pH 6.0 5.0 - 8.0   Protein, ur NEGATIVE NEGATIVE mg/dL   Nitrite NEGATIVE NEGATIVE   Leukocytes, UA NEGATIVE NEGATIVE   RBC / HPF 0-5 0 - 5 RBC/hpf   WBC, UA 0-5 0 - 5 WBC/hpf   Bacteria, UA RARE (A) NONE SEEN   Squamous Epithelial / LPF 0-5 (A) NONE SEEN  Troponin I     Status: None   Collection Time: 04/08/16 10:44 AM  Result Value Ref Range   Troponin I <0.03 <0.03 ng/mL  Differential     Status: None   Collection Time: 04/08/16 10:44 AM  Result Value Ref Range   Neutrophils Relative % 50 %   Neutro Abs 3.1 1.4 - 6.5 K/uL   Lymphocytes Relative 31 %   Lymphs Abs 1.9 1.0 - 3.6 K/uL   Monocytes Relative 9 %   Monocytes Absolute 0.5 0.2 - 0.9 K/uL   Eosinophils Relative 9 %   Eosinophils Absolute 0.5 0 - 0.7 K/uL   Basophils Relative 1 %   Basophils Absolute 0.1 0 - 0.1 K/uL  Hepatic function panel     Status: Abnormal   Collection Time: 04/08/16 10:44 AM  Result Value Ref Range   Total Protein 7.3 6.5 - 8.1 g/dL   Albumin 3.8 3.5 - 5.0 g/dL   AST 21 15 - 41 U/L   ALT 17 14 - 54 U/L   Alkaline Phosphatase 57 38 - 126 U/L   Total Bilirubin <  0.1 (L) 0.3 - 1.2 mg/dL   Bilirubin, Direct <0.1 (L) 0.1 - 0.5 mg/dL   Indirect Bilirubin NOT CALCULATED 0.3 - 0.9 mg/dL    PHQ2/9: Depression screen [facility] 2/9 04/08/2016 01/09/2016 10/03/2015 06/06/2015 04/29/2015  Decreased Interest 0 0 0 0 0  Down, Depressed, Hopeless 0 0 0 0 0  PHQ - 2 Score 0 0 0 0 0     Fall  Risk: Fall Risk  04/08/2016 01/09/2016 10/03/2015 06/06/2015 04/29/2015  Falls in the past year? _0      Functional Status Survey: Is the patient deaf or have difficulty hearing?: No Does the patient have difficulty seeing, even when wearing glasses/contacts?: No Does the patient have difficulty concentrating, remembering, or making decisions?: No Does the patient have difficulty walking or climbing stairs?: Yes (bursitis in hips occasionally) Does the patient have difficulty dressing or bathing?: No Does the patient have difficulty doing errands alone such as visiting a doctor's office or shopping?: No    Assessment & Plan  1. Right lumbar radiculitis  - predniSONE (STERAPRED UNI-PAK 48 TAB) 10 MG (48) TBPK tablet; Take by mouth daily.  Dispense: 48 tablet; Refill: 0 - cyclobenzaprine (FLEXERIL) 10 MG tablet; Take 0.5-1 tablets (5-10 mg total) by mouth at bedtime as needed.  Dispense: 30 tablet; Refill: 0 We will make sure she can see neurosurgeon for neck and back  2. Needs flu shot  Not given - she left without getting the shot  3. Cervical spinal stenosis  - cyclobenzaprine (FLEXERIL) 10 MG tablet; Take 0.5-1 tablets (5-10 mg total) by mouth at bedtime as needed.  Dispense: 30 tablet; Refill: 0  4. Prediabetes  Losing weight and changed her diet  5. Metabolic syndrome  We will recheck labs next year  6. Hypertension, benign  Well controlled at this time

## 2016-07-20 ENCOUNTER — Telehealth: Payer: Self-pay | Admitting: Family Medicine

## 2016-07-20 ENCOUNTER — Encounter: Payer: Self-pay | Admitting: *Deleted

## 2016-07-20 NOTE — Telephone Encounter (Signed)
Pt has an appt on 07/29/16

## 2016-07-20 NOTE — Telephone Encounter (Signed)
She would rather go to [facility].

## 2016-07-20 NOTE — Telephone Encounter (Signed)
They are external. She may go to [facility] walkin. I told her I didn't know if they could do anything for her there. If she can go to [facility] she may prefer that.

## 2016-07-20 NOTE — Telephone Encounter (Signed)
I need to see her. Internal? External? She can see them if she prefers

## 2016-07-20 NOTE — Telephone Encounter (Signed)
Pt called stating she has hemorrhoids and wants to know if there is anything Dr Ancil Boozer can do or if she needs to be referred to [facility]. Please advise.

## 2016-07-23 ENCOUNTER — Encounter: Payer: Self-pay | Admitting: *Deleted

## 2016-07-24 ENCOUNTER — Other Ambulatory Visit: Payer: Self-pay | Admitting: Family Medicine

## 2016-07-29 ENCOUNTER — Ambulatory Visit: Payer: Self-pay | Admitting: General Surgery

## 2016-08-24 ENCOUNTER — Other Ambulatory Visit: Payer: Self-pay | Admitting: Family Medicine

## 2016-08-24 DIAGNOSIS — G43409 Hemiplegic migraine, not intractable, without status migrainosus: Secondary | ICD-10-CM

## 2016-09-20 ENCOUNTER — Other Ambulatory Visit: Payer: Self-pay | Admitting: Family Medicine

## 2016-09-20 DIAGNOSIS — I10 Essential (primary) hypertension: Secondary | ICD-10-CM

## 2016-09-20 DIAGNOSIS — K219 Gastro-esophageal reflux disease without esophagitis: Secondary | ICD-10-CM

## 2016-10-18 ENCOUNTER — Other Ambulatory Visit: Payer: Self-pay | Admitting: Family Medicine

## 2016-11-30 ENCOUNTER — Ambulatory Visit (INDEPENDENT_AMBULATORY_CARE_PROVIDER_SITE_OTHER): Payer: BLUE CROSS/BLUE SHIELD | Admitting: Family Medicine

## 2016-11-30 ENCOUNTER — Encounter: Payer: Self-pay | Admitting: Family Medicine

## 2016-11-30 VITALS — BP 142/86 | HR 74 | Temp 98.2°F | Resp 16 | Ht 62.0 in | Wt 204.0 lb

## 2016-11-30 DIAGNOSIS — G43409 Hemiplegic migraine, not intractable, without status migrainosus: Secondary | ICD-10-CM | POA: Diagnosis not present

## 2016-11-30 DIAGNOSIS — R635 Abnormal weight gain: Secondary | ICD-10-CM | POA: Diagnosis not present

## 2016-11-30 DIAGNOSIS — G4700 Insomnia, unspecified: Secondary | ICD-10-CM

## 2016-11-30 DIAGNOSIS — Z01419 Encounter for gynecological examination (general) (routine) without abnormal findings: Secondary | ICD-10-CM

## 2016-11-30 DIAGNOSIS — Z8673 Personal history of transient ischemic attack (TIA), and cerebral infarction without residual deficits: Secondary | ICD-10-CM

## 2016-11-30 DIAGNOSIS — M5416 Radiculopathy, lumbar region: Secondary | ICD-10-CM | POA: Diagnosis not present

## 2016-11-30 DIAGNOSIS — E559 Vitamin D deficiency, unspecified: Secondary | ICD-10-CM

## 2016-11-30 DIAGNOSIS — Z0000 Encounter for general adult medical examination without abnormal findings: Secondary | ICD-10-CM

## 2016-11-30 DIAGNOSIS — I10 Essential (primary) hypertension: Secondary | ICD-10-CM

## 2016-11-30 DIAGNOSIS — K219 Gastro-esophageal reflux disease without esophagitis: Secondary | ICD-10-CM

## 2016-11-30 DIAGNOSIS — E785 Hyperlipidemia, unspecified: Secondary | ICD-10-CM | POA: Diagnosis not present

## 2016-11-30 DIAGNOSIS — E8881 Metabolic syndrome: Secondary | ICD-10-CM

## 2016-11-30 LAB — CBC WITH DIFFERENTIAL/PLATELET
BASOS ABS: 110 {cells}/uL (ref 0–200)
Basophils Relative: 2 %
EOS ABS: 550 {cells}/uL — AB (ref 15–500)
Eosinophils Relative: 10 %
HCT: 42.2 % (ref 35.0–45.0)
Hemoglobin: 14 g/dL (ref 11.7–15.5)
Lymphocytes Relative: 30 %
Lymphs Abs: 1650 cells/uL (ref 850–3900)
MCH: 28.7 pg (ref 27.0–33.0)
MCHC: 33.2 g/dL (ref 32.0–36.0)
MCV: 86.7 fL (ref 80.0–100.0)
MONO ABS: 495 {cells}/uL (ref 200–950)
MONOS PCT: 9 %
MPV: 10 fL (ref 7.5–12.5)
NEUTROS PCT: 49 %
Neutro Abs: 2695 cells/uL (ref 1500–7800)
Platelets: 290 10*3/uL (ref 140–400)
RBC: 4.87 MIL/uL (ref 3.80–5.10)
RDW: 13.8 % (ref 11.0–15.0)
WBC: 5.5 10*3/uL (ref 3.8–10.8)

## 2016-11-30 MED ORDER — HYDROCHLOROTHIAZIDE 25 MG PO TABS: 25.0000 mg | ORAL_TABLET | Freq: Every day | ORAL | 1 refills | Status: DC

## 2016-11-30 NOTE — Progress Notes (Signed)
Name: Monique Daniels   MRN: [id number]    DOB: 29-Jun-1961   Date:11/30/2016       Progress Note  Subjective  Chief Complaint  Chief Complaint  Patient presents with  . Annual Exam    HPI  Well Woman: she is married, last pap smear in 2017 was normal with negative HPV. She is due for mammogram and will get it scheduled, colonoscopy is up to date. She has hot flashes and night sweats, discussed options, but she will try black cohosh.   Low back pain with radiculitis: she states symptoms started about 7 months ago, she states it started shortly after she started walking. She states pain is described as dull aching on right buttocks with periods of exacerbation, at times piercing and shoots down right outer thigh,down to her ankle . No bowel or bladder incontinence, no saddle anesthesia. She states while on prednisone taper the symptoms improved, but never resolved. She is now feeling weaker on right side, difficulty going up and down stairs, unable to balance self on right foot only. She stopped walking because of pain and is gaining weight.   HTN: patient is taking medication daily, denies side effects. No chest pain, or palpitation. BP is slightly up today   GERD: under control with medication, no heartburn, no regurgitation since esophageal dilation 2016, down to one Prilosec daily   Migraine: hemiplegic, she is no longer taking Topamax, back on Pamelor at night, symptoms are not as frequent now, last episodes was months ago She has a lot of nuchal pain secondary to DDD cervical spine. She states she was having too much tingling on Topamax and she is no longer taking it. She is on Nortriptyline and is doing well , last episode months ago.  Metabolic Syndrome: she is gaining  weight, denies polyphagia, polydipsia or polyuria. She had to stop exercising because of back pain .  Cervical spinal stenosis: referral made to neurosurgeon but she never got a phone call. She states neck pain  has improved, no longer has radiculitis.    Patient Active Problem List   Diagnosis Date Noted  . Cervical spinal stenosis 04/08/2016  . Prediabetes 01/09/2016  . Dyslipidemia 10/03/2015  . Seasonal allergic rhinitis 04/29/2015  . History of esophageal stricture 01/28/2015  . Atypical nevus 01/27/2015  . Carpal tunnel syndrome 01/27/2015  . Insomnia, persistent 01/27/2015  . DDD (degenerative disc disease), cervical 01/27/2015  . Panic attacks 01/27/2015  . Gastric reflux 01/27/2015  . Headache, hemiplegic migraine 01/27/2015  . H/O transient cerebral ischemia 01/27/2015  . Hypertension, benign 01/27/2015  . Calculus of kidney 01/27/2015  . Adult BMI 30+ 01/27/2015  . Vitamin D deficiency 01/27/2015  . Snores 01/27/2015    Past Surgical History:  Procedure Laterality Date  . HYSTEROSCOPY W/D&C N/A 02/07/2015   Procedure: DILATATION AND CURETTAGE /HYSTEROSCOPY;  Surgeon: Will Bonnet, MD;  Location: [facility] ORS;  Service: Gynecology;  Laterality: N/A;  . HYSTEROSCOPY WITH NOVASURE N/A 02/07/2015   Procedure: HYSTEROSCOPY WITH NOVASURE;  Surgeon: Will Bonnet, MD;  Location: [facility] OR[facility];  Service: Gynecology;  Laterality: N/A;  . TOOTH EXTRACTION    . TUBAL LIGATION  1989    Family History  Problem Relation Age of Onset  . Asthma Mother   . Heart disease Mother   . Hyperlipidemia Mother   . Emphysema Mother   . Tremor Mother   . Macular degeneration Mother   . Asthma Son   . Heart attack Father   .  Cancer Father     Unknown  . Urolithiasis Son   . Tremor Sister   . Tremor Brother   . Macular degeneration Maternal Grandmother   . Alzheimer's disease Paternal Grandfather   . Breast cancer Neg Hx     Social History   Social History  . Marital status: Married    Spouse name: Merry Proud  . Number of children: 2  . Years of education: 12th   Occupational History  . Medical records [employer]   Social History Main Topics  . Smoking status: Never  Smoker  . Smokeless tobacco: Never Used  . Alcohol use No  . Drug use: No  . Sexual activity: Yes    Partners: Male     Comment: BTL   Other Topics Concern  . Not on file   Social History Narrative   Patient lives at home with husband Merry Proud.    Patient works at [employer].    Patient has a high school education.    Patient has 2 children.      Current Outpatient Prescriptions:  .  ALPRAZolam (XANAX) 0.5 MG tablet, Take 1 tablet (0.5 mg total) by mouth daily as needed for anxiety (05-1 tablet)., Disp: 30 tablet, Rfl: 0 .  aspirin-acetaminophen-caffeine (EXCEDRIN MIGRAINE) 250-250-65 MG per tablet, Take 2 tablets by mouth every 6 (six) hours as needed for pain., Disp: , Rfl:  .  cyclobenzaprine (FLEXERIL) 10 MG tablet, Take 0.5-1 tablets (5-10 mg total) by mouth at bedtime as needed., Disp: 30 tablet, Rfl: 0 .  hydrochlorothiazide (HYDRODIURIL) 25 MG tablet, Take 1 tablet (25 mg total) by mouth daily., Disp: 90 tablet, Rfl: 1 .  Multiple Vitamins-Minerals (CENTRUM SILVER ADULT 50+ PO), Take 1 tablet by mouth daily., Disp: , Rfl:  .  nortriptyline (PAMELOR) 10 MG capsule, TAKE 1 CAPSULE (10 MG TOTAL) BY MOUTH AT BEDTIME., Disp: 90 capsule, Rfl: 0 .  omeprazole (PRILOSEC) 20 MG capsule, TAKE 1 CAPSULE (20 MG TOTAL) BY MOUTH DAILY., Disp: 90 capsule, Rfl: 0 .  sodium chloride (OCEAN) 0.65 % nasal spray, Place 1 spray into both nostrils daily as needed (for allergies). , Disp: , Rfl:  .  traZODone (DESYREL) 50 MG tablet, Take 1 tablet (50 mg total) by mouth at bedtime., Disp: 90 tablet, Rfl: 1 .  TURMERIC CURCUMIN PO, Take 1 capsule by mouth daily. 900 mg daily, Disp: , Rfl:  .  Vitamin D, Ergocalciferol, (DRISDOL) 50000 units CAPS capsule, TAKE 1 CAPSULE (50,000 UNITS TOTAL) BY MOUTH EVERY 7 (SEVEN) DAYS., Disp: 12 capsule, Rfl: 0  Allergies  Allergen Reactions  . Claritin  [Loratadine] Other (See Comments)  . Codeine Other (See Comments)    Severe headache   . Entex Lq   [Phenylephrine-Guaifenesin] Other (See Comments)  . Fexofenadine Other (See Comments)  . Loratadine-Pseudoephedrine Er Other (See Comments)  . Pseudoephedrine Other (See Comments)  . Tramadol Nausea Only     ROS  Constitutional: Negative for fever or weight change.  Respiratory: Negative for cough and shortness of breath.   Cardiovascular: Negative for chest pain or palpitations.  Gastrointestinal: Negative for abdominal pain, no bowel changes.  Musculoskeletal: Positive  for gait problem or joint swelling.  Skin: Negative for rash.  Neurological: Negative for dizziness or headache.  No other specific complaints in a complete review of systems (except as listed in HPI above).  Objective  Vitals:   11/30/16 0822  BP: (!) 142/86  Pulse: 74  Resp: 16  Temp: 98.2  F (36.8 C)  TempSrc: Oral  SpO2: 98%  Weight: 204 lb (92.5 kg)  Height: 5\' 2"  (1.575 m)    Body mass index is 37.31 kg/m.  Physical Exam  Constitutional: Patient appears well-developed and obese. No distress.  HENT: Head: Normocephalic and atraumatic. Ears: B TMs ok, no erythema or effusion; Nose: Nose normal. Mouth/Throat: Oropharynx is clear and moist. No oropharyngeal exudate.  Eyes: Conjunctivae and EOM are normal. Pupils are equal, round, and reactive to light. No scleral icterus.  Neck: Normal range of motion. Neck supple. No JVD present. No thyromegaly present.  Cardiovascular: Normal rate, regular rhythm and normal heart sounds.  No murmur heard. No BLE edema. Pulmonary/Chest: Effort normal and breath sounds normal. No respiratory distress. Abdominal: Soft. Bowel sounds are normal, no distension. There is no tenderness. no masses Breast: no lumps or masses, no nipple discharge or rashes FEMALE GENITALIA:  Not done RECTAL: not done Musculoskeletal: pain during palpation of right lumbar spine, positive right straight leg raise. Right leg is 5/5 strength, but weaker than left Neurological: he is alert and  oriented to person, place, and time. No cranial nerve deficit. Coordination, balance, strength, speech and gait are normal.  Skin: Skin is warm and dry. No rash noted. No erythema.  Psychiatric: Patient has a normal mood and affect. behavior is normal. Judgment and thought content normal.  PHQ2/9: Depression screen [facility] 2/9 11/30/2016 04/08/2016 01/09/2016 10/03/2015 06/06/2015  Decreased Interest 0 0 0 0 0  Down, Depressed, Hopeless 0 0 0 0 0  PHQ - 2 Score 0 0 0 0 0     Fall Risk: Fall Risk  11/30/2016 04/08/2016 01/09/2016 10/03/2015 06/06/2015  Falls in the past year? No No No No No     Functional Status Survey: Is the patient deaf or have difficulty hearing?: No Does the patient have difficulty seeing, even when wearing glasses/contacts?: No Does the patient have difficulty concentrating, remembering, or making decisions?: No Does the patient have difficulty walking or climbing stairs?: No Does the patient have difficulty dressing or bathing?: No Does the patient have difficulty doing errands alone such as visiting a doctor's office or shopping?: No   Assessment & Plan  1. Well woman exam  Discussed importance of 150 minutes of physical activity weekly, eat two servings of fish weekly, eat one serving of tree nuts ( cashews, pistachios, pecans, almonds.Marland Kitchen) every other day, eat 6 servings of fruit/vegetables daily and drink plenty of water and avoid sweet beverages.   2. Hypertension, benign  - CBC with Differential/Platelet - COMPLETE METABOLIC PANEL WITH GFR - hydrochlorothiazide (HYDRODIURIL) 25 MG tablet; Take 1 tablet (25 mg total) by mouth daily.  Dispense: 90 tablet; Refill: 1  3. Metabolic syndrome  - Hemoglobin A1c - Insulin, fasting  4. Right lumbar radiculitis  She took prednisone taper back in October and symptoms improved, but pain has been daily since and with difficulty bearing weight on right side, affecting her sleep  - MR Lumbar Spine Wo Contrast; Future  5.  History of TIA (transient ischemic attack)  No recent problems, no migraine episodes  6. Insomnia, persistent  Taking medication prn   7. Vitamin D deficiency  - VITAMIN D 25 Hydroxy (Vit-D Deficiency, Fractures)  8. Hemiplegic migraine without status migrainosus, not intractable  Doing well  9. Dyslipidemia  - Lipid panel  10. Gastric reflux  Under control with medication   11. Weight gain  - TSH - Vitamin B12

## 2016-12-01 LAB — COMPREHENSIVE METABOLIC PANEL
AG Ratio: 1.5 Ratio (ref 1.0–2.5)
ALT: 13 U/L (ref 6–29)
AST: 14 U/L (ref 10–35)
Albumin: 3.8 g/dL (ref 3.6–5.1)
Alkaline Phosphatase: 58 U/L (ref 33–130)
BUN/Creatinine Ratio: 19.7 Ratio (ref 6–22)
BUN: 13 mg/dL (ref 7–25)
CO2: 29 mmol/L (ref 20–31)
Calcium: 9 mg/dL (ref 8.6–10.4)
Chloride: 107 mmol/L (ref 98–110)
Creat: 0.66 mg/dL (ref 0.50–1.05)
GFR, Est African American: >89 mL/min (ref >=60)
GLUCOSE: 89 mg/dL (ref 65–99)
Globulin: 2.6 g/dL (ref 1.9–3.7)
POTASSIUM: 4.6 mmol/L (ref 3.5–5.3)
SODIUM: 141 mmol/L (ref 135–146)
TOTAL PROTEIN: 6.4 g/dL (ref 6.1–8.1)
Total Bilirubin: 0.3 mg/dL (ref 0.2–1.2)

## 2016-12-01 LAB — CMP 10231
AG Ratio: 1.5 Ratio (ref 1.0–2.5)
ALT: 13 U/L (ref 6–29)
AST: 14 U/L (ref 10–35)
Albumin: 3.8 g/dL (ref 3.6–5.1)
Alkaline Phosphatase: 58 U/L (ref 33–130)
BILIRUBIN TOTAL: 0.3 mg/dL (ref 0.2–1.2)
BUN/Creatinine Ratio: 19.7 Ratio (ref 6–22)
BUN: 13 mg/dL (ref 7–25)
CALCIUM: 9 mg/dL (ref 8.6–10.4)
CHLORIDE: 107 mmol/L (ref 98–110)
CO2: 29 mmol/L (ref 20–31)
CREATININE: 0.66 mg/dL (ref 0.50–1.05)
GLOBULIN: 2.6 g/dL (ref 1.9–3.7)
GLUCOSE: 89 mg/dL (ref 65–99)
Potassium: 4.6 mmol/L (ref 3.5–5.3)
Sodium: 141 mmol/L (ref 135–146)
TOTAL PROTEIN: 6.4 g/dL (ref 6.1–8.1)

## 2016-12-01 LAB — LIPID PANEL
CHOL/HDL RATIO: 3.4 ratio (ref <5.0)
Cholesterol: 182 mg/dL (ref <200)
HDL: 54 mg/dL (ref >50)
LDL CALC: 116 mg/dL — AB (ref <100)
Triglycerides: 59 mg/dL (ref <150)
VLDL: 12 mg/dL (ref <30)

## 2016-12-01 LAB — VITAMIN B12: Vitamin B-12: 1191 pg/mL — ABNORMAL HIGH (ref 200–1100)

## 2016-12-01 LAB — HEMOGLOBIN A1C
Hgb A1c MFr Bld: 5.3 % (ref <5.7)
Mean Plasma Glucose: 105 mg/dL

## 2016-12-01 LAB — TSH: TSH: 2.8 m[IU]/L

## 2016-12-01 LAB — INSULIN, FASTING: INSULIN FASTING, SERUM: 10.7 u[IU]/mL (ref 2.0–19.6)

## 2016-12-02 LAB — VITAMIN D 25 HYDROXY (VIT D DEFICIENCY, FRACTURES): VIT D 25 HYDROXY: 34 ng/mL (ref 30–100)

## 2016-12-03 ENCOUNTER — Telehealth: Payer: Self-pay

## 2016-12-03 NOTE — Telephone Encounter (Signed)
-----   Message from Steele Sizer, MD sent at 12/03/2016  8:00 AM EDT ----- Mild elevation of fasting insulin  Normal TSH B12 is slightly elevated, okay to back down on supplementations Lipid panel has improved, continue current regiment Sugar, kidney and transaminases are within normal limits Sugar, kidney and transaminases are within normal limits hgbA1C is at goal  Normal CBC except for elevation of eosinophils, likely because it is allergy season

## 2016-12-03 NOTE — Telephone Encounter (Signed)
Left message for patient to return my call for lab results. 

## 2016-12-14 ENCOUNTER — Ambulatory Visit
Admission: RE | Admit: 2016-12-14 | Discharge: 2016-12-14 | Disposition: A | Discharge status: Home / Self Care | Payer: BLUE CROSS/BLUE SHIELD | Source: Ambulatory Visit | Attending: Family Medicine | Admitting: Family Medicine

## 2016-12-14 DIAGNOSIS — M5126 Other intervertebral disc displacement, lumbar region: Secondary | ICD-10-CM | POA: Diagnosis not present

## 2016-12-14 DIAGNOSIS — M4726 Other spondylosis with radiculopathy, lumbar region: Secondary | ICD-10-CM | POA: Diagnosis not present

## 2016-12-14 DIAGNOSIS — M47816 Spondylosis without myelopathy or radiculopathy, lumbar region: Secondary | ICD-10-CM | POA: Diagnosis not present

## 2016-12-14 DIAGNOSIS — M5416 Radiculopathy, lumbar region: Secondary | ICD-10-CM | POA: Diagnosis present

## 2017-03-24 DIAGNOSIS — D485 Neoplasm of uncertain behavior of skin: Secondary | ICD-10-CM | POA: Diagnosis not present

## 2017-03-24 DIAGNOSIS — D239 Other benign neoplasm of skin, unspecified: Secondary | ICD-10-CM | POA: Diagnosis not present

## 2017-03-24 DIAGNOSIS — Z86018 Personal history of other benign neoplasm: Secondary | ICD-10-CM | POA: Diagnosis not present

## 2017-03-24 DIAGNOSIS — B078 Other viral warts: Secondary | ICD-10-CM | POA: Diagnosis not present

## 2017-03-24 DIAGNOSIS — D2330 Other benign neoplasm of skin of unspecified part of face: Secondary | ICD-10-CM | POA: Diagnosis not present

## 2017-07-05 ENCOUNTER — Ambulatory Visit: Payer: BLUE CROSS/BLUE SHIELD | Admitting: Family Medicine

## 2017-07-06 ENCOUNTER — Other Ambulatory Visit
Admission: RE | Admit: 2017-07-06 | Discharge: 2017-07-06 | Disposition: A | Discharge status: Home / Self Care | Payer: BLUE CROSS/BLUE SHIELD | Source: Ambulatory Visit | Attending: Family Medicine | Admitting: Family Medicine

## 2017-07-06 ENCOUNTER — Ambulatory Visit: Payer: BLUE CROSS/BLUE SHIELD | Admitting: Family Medicine

## 2017-07-06 ENCOUNTER — Encounter: Payer: Self-pay | Admitting: Family Medicine

## 2017-07-06 VITALS — BP 156/90 | HR 72 | Temp 98.1°F | Resp 16 | Ht 62.0 in | Wt 203.3 lb

## 2017-07-06 DIAGNOSIS — K219 Gastro-esophageal reflux disease without esophagitis: Secondary | ICD-10-CM | POA: Diagnosis not present

## 2017-07-06 DIAGNOSIS — R8290 Unspecified abnormal findings in urine: Secondary | ICD-10-CM | POA: Diagnosis not present

## 2017-07-06 DIAGNOSIS — R079 Chest pain, unspecified: Secondary | ICD-10-CM | POA: Diagnosis not present

## 2017-07-06 DIAGNOSIS — R195 Other fecal abnormalities: Secondary | ICD-10-CM

## 2017-07-06 DIAGNOSIS — R1011 Right upper quadrant pain: Secondary | ICD-10-CM

## 2017-07-06 DIAGNOSIS — I10 Essential (primary) hypertension: Secondary | ICD-10-CM

## 2017-07-06 LAB — POCT URINALYSIS DIPSTICK
Bilirubin, UA: NEGATIVE
GLUCOSE UA: NEGATIVE
KETONES UA: NEGATIVE
Nitrite, UA: NEGATIVE
UROBILINOGEN UA: 0.2 U/dL
pH, UA: 6 (ref 5.0–8.0)

## 2017-07-06 LAB — COMPREHENSIVE METABOLIC PANEL
ALT: 18 U/L (ref 14–54)
ANION GAP: 7 (ref 5–15)
AST: 20 U/L (ref 15–41)
Albumin: 3.7 g/dL (ref 3.5–5.0)
Alkaline Phosphatase: 58 U/L (ref 38–126)
BUN: 13 mg/dL (ref 6–20)
CHLORIDE: 107 mmol/L (ref 101–111)
CO2: 27 mmol/L (ref 22–32)
Calcium: 8.6 mg/dL — ABNORMAL LOW (ref 8.9–10.3)
Creatinine, Ser: 0.69 mg/dL (ref 0.44–1.00)
Glucose, Bld: 87 mg/dL (ref 65–99)
POTASSIUM: 3.9 mmol/L (ref 3.5–5.1)
Sodium: 141 mmol/L (ref 135–145)
TOTAL PROTEIN: 6.8 g/dL (ref 6.5–8.1)
Total Bilirubin: 0.3 mg/dL (ref 0.3–1.2)

## 2017-07-06 LAB — CBC WITH DIFFERENTIAL/PLATELET
BASOS ABS: 0.2 10*3/uL — AB (ref 0–0.1)
Basophils Relative: 3 %
EOS PCT: 8 %
Eosinophils Absolute: 0.5 10*3/uL (ref 0–0.7)
HCT: 39.2 % (ref 35.0–47.0)
Hemoglobin: 13.2 g/dL (ref 12.0–16.0)
LYMPHS ABS: 2 10*3/uL (ref 1.0–3.6)
LYMPHS PCT: 34 %
MCH: 28.9 pg (ref 26.0–34.0)
MCHC: 33.6 g/dL (ref 32.0–36.0)
MCV: 86.2 fL (ref 80.0–100.0)
MONO ABS: 0.5 10*3/uL (ref 0.2–0.9)
MONOS PCT: 9 %
Neutro Abs: 2.7 10*3/uL (ref 1.4–6.5)
Neutrophils Relative %: 46 %
PLATELETS: 247 10*3/uL (ref 150–440)
RBC: 4.55 MIL/uL (ref 3.80–5.20)
RDW: 13.8 % (ref 11.5–14.5)
WBC: 5.9 10*3/uL (ref 3.6–11.0)

## 2017-07-06 LAB — LIPASE, BLOOD: LIPASE: 32 U/L (ref 11–51)

## 2017-07-06 NOTE — Patient Instructions (Addendum)
Please go directly to ER if you develop fever >101.59F, chest pain (see discussion above), shortness of breath, abdominal distension or severe pain, vomiting, blood in stool, new/worsening/un-resolving symptoms.  Your ultrasound is at the [facility] at 8:45am tomorrow 07/07/2017. Please arrive, fasting (nothing but water to eat or drink after midnight) at 8:30am to allow time for registration. Abdominal Pain, Adult Many things can cause belly (abdominal) pain. Most times, belly pain is not dangerous. Many cases of belly pain can be watched and treated at home. Sometimes belly pain is serious, though. Your doctor will try to find the cause of your belly pain. Follow these instructions at home:  Take over-the-counter and prescription medicines only as told by your doctor. Do not take medicines that help you poop (laxatives) unless told to by your doctor.  Drink enough fluid to keep your pee (urine) clear or pale yellow.  Watch your belly pain for any changes.  Keep all follow-up visits as told by your doctor. This is important. Contact a doctor if:  Your belly pain changes or gets worse.  You are not hungry, or you lose weight without trying.  You are having trouble pooping (constipated) or have watery poop (diarrhea) for more than 2-3 days.  You have pain when you pee or poop.  Your belly pain wakes you up at night.  Your pain gets worse with meals, after eating, or with certain foods.  You are throwing up and cannot keep anything down.  You have a fever. Get help right away if:  Your pain does not go away as soon as your doctor says it should.  You cannot stop throwing up.  Your pain is only in areas of your belly, such as the right side or the left lower part of the belly.  You have bloody or black poop, or poop that looks like tar.  You have very bad pain, cramping, or bloating in your belly.  You have signs of not having enough fluid or water in your  body (dehydration), such as: ? Dark pee, very little pee, or no pee. ? Cracked lips. ? Dry mouth. ? Sunken eyes. ? Sleepiness. ? Weakness. This information is not intended to replace advice given to you by your health care provider. Make sure you discuss any questions you have with your health care provider. Document Released: 01/06/2008 Document Revised: 02/07/2016 Document Reviewed: 01/01/2016 Elsevier Interactive Patient Education  2017 [place] for Gastroesophageal Reflux Disease, Adult When you have gastroesophageal reflux disease (GERD), the foods you eat and your eating habits are very important. Choosing the right foods can help ease your discomfort. What guidelines do I need to follow?  Choose fruits, vegetables, whole grains, and low-fat dairy products.  Choose low-fat meat, fish, and poultry.  Limit fats such as oils, salad dressings, butter, nuts, and avocado.  Keep a food diary. This helps you identify foods that cause symptoms.  Avoid foods that cause symptoms. These may be different for everyone.  Eat small meals often instead of 3 large meals a day.  Eat your meals slowly, in a place where you are relaxed.  Limit fried foods.  Cook foods using methods other than frying.  Avoid drinking alcohol.  Avoid drinking large amounts of liquids with your meals.  Avoid bending over or lying down until 2-3 hours after eating. What foods are not recommended? These are some foods and drinks that may make your symptoms worse: Vegetables Tomatoes. Tomato juice.  Tomato and spaghetti sauce. Chili peppers. Onion and garlic. Horseradish. Fruits Oranges, grapefruit, and lemon (fruit and juice). Meats High-fat meats, fish, and poultry. This includes hot dogs, ribs, ham, sausage, salami, and bacon. Dairy Whole milk and chocolate milk. Sour cream. Cream. Butter. Ice cream. Cream cheese. Drinks Coffee and tea. Bubbly (carbonated) drinks or energy  drinks. Condiments Hot sauce. Barbecue sauce. Sweets/Desserts Chocolate and cocoa. Donuts. Peppermint and spearmint. Fats and Oils High-fat foods. This includes [place] fries and potato chips. Other Vinegar. Strong spices. This includes black pepper, white pepper, red pepper, cayenne, curry powder, cloves, ginger, and chili powder. The items listed above may not be a complete list of foods and drinks to avoid. Contact your dietitian for more information. This information is not intended to replace advice given to you by your health care provider. Make sure you discuss any questions you have with your health care provider. Document Released: 01/19/2012 Document Revised: 12/26/2015 Document Reviewed: 05/24/2013 Elsevier Interactive Patient Education  2017 [employer]  Nonspecific Chest Pain Chest pain can be caused by many different conditions. There is a chance that your pain could be related to something serious, such as a heart attack or a blood clot in your lungs. Chest pain can also be caused by conditions that are not life-threatening. If you have chest pain, it is very important to follow up with your doctor. Follow these instructions at home: Medicines  If you were prescribed an antibiotic medicine, take it as told by your doctor. Do not stop taking the antibiotic even if you start to feel better.  Take over-the-counter and prescription medicines only as told by your doctor. Lifestyle  Do not use any products that contain nicotine or tobacco, such as cigarettes and e-cigarettes. If you need help quitting, ask your doctor.  Do not drink alcohol.  Make lifestyle changes as told by your doctor. These may include: ? Getting regular exercise. Ask your doctor for some activities that are safe for you. ? Eating a heart-healthy diet. A diet specialist (dietitian) can help you to learn healthy eating options. ? Staying at a healthy weight. ? Managing diabetes, if needed. ? Lowering  your stress, as with deep breathing or spending time in nature. General instructions  Avoid any activities that make you feel chest pain.  If your chest pain is because of heartburn: ? Raise (elevate) the head of your bed about 6 inches (15 cm). You can do this by putting blocks under the bed legs at the head of the bed. ? Do not sleep with extra pillows under your head. That does not help heartburn.  Keep all follow-up visits as told by your doctor. This is important. This includes any further testing if your chest pain does not go away. Contact a doctor if:  Your chest pain does not go away.  You have a rash with blisters on your chest.  You have a fever.  You have chills. Get help right away if:  Your chest pain is worse.  You have a cough that gets worse, or you cough up blood.  You have very bad (severe) pain in your belly (abdomen).  You are very weak.  You pass out (faint).  You have either of these for no clear reason: ? Sudden chest discomfort. ? Sudden discomfort in your arms, back, neck, or jaw.  You have shortness of breath at any time.  You suddenly start to sweat, or your skin gets clammy.  You feel sick to your  stomach (nauseous).  You throw up (vomit).  You suddenly feel light-headed or dizzy.  Your heart starts to beat fast, or it feels like it is skipping beats. These symptoms may be an emergency. Do not wait to see if the symptoms will go away. Get medical help right away. Call your local emergency services (911 in the [place] Do not drive yourself to the hospital. This information is not intended to replace advice given to you by your health care provider. Make sure you discuss any questions you have with your health care provider. Document Released: 01/06/2008 Document Revised: 04/13/2016 Document Reviewed: 04/13/2016 Elsevier Interactive Patient Education  2017 [employer].

## 2017-07-06 NOTE — Progress Notes (Signed)
Name: Monique Daniels   MRN: [id number]    DOB: [birth year]   Date:07/06/2017       Progress Note  Subjective  Chief Complaint  Chief Complaint  Patient presents with  . Flank Pain    patient presents with right flank pain for about 4 days. bloating after eating. otc advil.    HPI  Pt presents with 4 days of RUQ abdominal pain that radiates around to the right flank. Yesterday she has one episode of RUQ pain radiating into right flank, right shoulder blade, and right chest.  Pain worsens after eating - nausea and bloating, no vomiting. Reports chronic constipation, but over the last few days she noticed pale and softer stools - no dark/tarry or bright red blood stools.  - She takes 20mg  Prilosec daily, today she took an additional 20mg  for GERD.   - Has history of kidney stones, states this does not feel similar; no urinary frequency, dysuria; does endorse dark and foul-smelling urine.   - Pt has HTN, elevated today, pt has been taking HCTZ daily.  Patient Active Problem List   Diagnosis Date Noted  . Cervical spinal stenosis 04/08/2016  . Prediabetes 01/09/2016  . Dyslipidemia 10/03/2015  . Seasonal allergic rhinitis 04/29/2015  . History of esophageal stricture 01/28/2015  . Atypical nevus 01/27/2015  . Carpal tunnel syndrome 01/27/2015  . Insomnia, persistent 01/27/2015  . DDD (degenerative disc disease), cervical 01/27/2015  . Panic attacks 01/27/2015  . Gastric reflux 01/27/2015  . Headache, hemiplegic migraine 01/27/2015  . H/O transient cerebral ischemia 01/27/2015  . Hypertension, benign 01/27/2015  . Calculus of kidney 01/27/2015  . Adult BMI 30+ 01/27/2015  . Vitamin D deficiency 01/27/2015  . Snores 01/27/2015    Social History   Tobacco Use  . Smoking status: Never Smoker  . Smokeless tobacco: Never Used  Substance Use Topics  . Alcohol use: No    Alcohol/week: 0.0 - 0.6 oz     Current Outpatient Medications:  .  ALPRAZolam (XANAX) 0.5 MG tablet,  Take 1 tablet (0.5 mg total) by mouth daily as needed for anxiety (05-1 tablet)., Disp: 30 tablet, Rfl: 0 .  aspirin-acetaminophen-caffeine (EXCEDRIN MIGRAINE) 250-250-65 MG per tablet, Take 2 tablets by mouth every 6 (six) hours as needed for pain., Disp: , Rfl:  .  cyclobenzaprine (FLEXERIL) 10 MG tablet, Take 0.5-1 tablets (5-10 mg total) by mouth at bedtime as needed., Disp: 30 tablet, Rfl: 0 .  hydrochlorothiazide (HYDRODIURIL) 25 MG tablet, Take 1 tablet (25 mg total) by mouth daily., Disp: 90 tablet, Rfl: 1 .  Multiple Vitamins-Minerals (CENTRUM SILVER ADULT 50+ PO), Take 1 tablet by mouth daily., Disp: , Rfl:  .  nortriptyline (PAMELOR) 10 MG capsule, TAKE 1 CAPSULE (10 MG TOTAL) BY MOUTH AT BEDTIME., Disp: 90 capsule, Rfl: 0 .  omeprazole (PRILOSEC) 20 MG capsule, TAKE 1 CAPSULE (20 MG TOTAL) BY MOUTH DAILY., Disp: 90 capsule, Rfl: 0 .  sodium chloride (OCEAN) 0.65 % nasal spray, Place 1 spray into both nostrils daily as needed (for allergies). , Disp: , Rfl:  .  traZODone (DESYREL) 50 MG tablet, Take 1 tablet (50 mg total) by mouth at bedtime., Disp: 90 tablet, Rfl: 1 .  TURMERIC CURCUMIN PO, Take 1 capsule by mouth daily. 900 mg daily, Disp: , Rfl:  .  Vitamin D, Ergocalciferol, (DRISDOL) 50000 units CAPS capsule, TAKE 1 CAPSULE (50,000 UNITS TOTAL) BY MOUTH EVERY 7 (SEVEN) DAYS., Disp: 12 capsule, Rfl: 0  Allergies  Allergen Reactions  .  Claritin  [Loratadine] Other (See Comments)  . Codeine Other (See Comments)    Severe headache   . Entex Lq  [Phenylephrine-Guaifenesin] Other (See Comments)  . Fexofenadine Other (See Comments)  . Loratadine-Pseudoephedrine Er Other (See Comments)  . Pseudoephedrine Other (See Comments)  . Tramadol Nausea Only    ROS  Constitutional: Negative for fever or weight change.  Respiratory: Negative for cough and shortness of breath.   Cardiovascular: Negative for palpitations. See HPI Gastrointestinal: See HPI Musculoskeletal: Negative for gait  problem or joint swelling.  Skin: Negative for rash.  Neurological: Negative for dizziness or headache.  No other specific complaints in a complete review of systems (except as listed in HPI above).  Objective  Vitals:   07/06/17 1608 07/06/17 1713  BP: (!) 158/94 (!) 156/90  Pulse: 72   Resp: 16   Temp: 98.1 F (36.7 C)   TempSrc: Oral   SpO2: 98%   Weight: 203 lb 4.8 oz (92.2 kg)   Height: 5\' 2"  (1.575 m)     Body mass index is 37.18 kg/m.  Nursing Note and Vital Signs reviewed.  Physical Exam  Constitutional: Patient appears well-developed and well-nourished. Obese.  No distress.  HEENT: head atraumatic, normocephalic, neck supple without lymphadenopathy, oropharynx pink and moist without exudate Cardiovascular: Normal rate, regular rhythm, S1/S2 present.  No murmur or rub heard. No BLE edema. Pulmonary/Chest: Effort normal and breath sounds clear. No respiratory distress or retractions. Abdominal: Soft, RUQ tenderness, negative murphy's, bowel sounds present x4 quadrants.  No CVA Tenderness, no flank tenderness on palpation Psychiatric: Patient has a normal mood and affect. behavior is normal. Judgment and thought content normal.  No results found for this or any previous visit (from the past 2160 hour(s)).   Assessment & Plan  1. RUQ pain - CBC w/Diff/Platelet - COMPLETE METABOLIC PANEL WITH GFR - Lipase - US Abdomen Limited RUQ; Future - Scheduled for 07/07/2017 at 0845am per AVS. - Hepatitis, Acute  2. Right-sided chest pain - EKG 12-Lead - Normal sinus rhythm, reviewed with PCP Dr. Ancil Boozer as well. - STRONGLY advised that patient present to the ER for further evaluation BP is elevated and she had right-sided chest pain yesterday radiating into the right arm (has since resolved) - advised atypical presentation for MI and PE are more common in females. Pt declines ER today, but will seek emergency care if chest pain returns or worsens.  3. Foul smelling urine -  POCT urinalysis dipstick - Positive for bilirubin and amber in color, negative for infection today.  4. Pale stool - US Abdomen Limited RUQ; Future - Hepatitis, Acute  5. Gastric reflux - Advised to take 20mg  Prilosec BID instead of once daily for the time being.  Sit upright after meals for >30-29minutes, avoid trigger foods.  6. Hypertension, benign BP is elevated today, may be secondary to pain and/or stress of illness. We will hold off on changing medications today.  Close follow up is advised in 2 days.  - Return in about 2 days (around 07/08/2017) for RUQ/Chest Pain Follow Up .  -Red flags and when to present for emergency care or RTC including fever >101.7F, chest pain (see discussion above), shortness of breath, abdominal distension or severe pain, vomiting, blood in stool, new/worsening/un-resolving symptoms, reviewed with patient at time of visit. Follow up and care instructions discussed and provided in AVS.

## 2017-07-07 ENCOUNTER — Ambulatory Visit
Admission: RE | Admit: 2017-07-07 | Discharge: 2017-07-07 | Disposition: A | Discharge status: Home / Self Care | Payer: BLUE CROSS/BLUE SHIELD | Source: Ambulatory Visit | Attending: Family Medicine | Admitting: Family Medicine

## 2017-07-07 ENCOUNTER — Telehealth: Payer: Self-pay

## 2017-07-07 ENCOUNTER — Other Ambulatory Visit: Payer: Self-pay | Admitting: Family Medicine

## 2017-07-07 ENCOUNTER — Ambulatory Visit: Payer: BLUE CROSS/BLUE SHIELD | Admitting: Family Medicine

## 2017-07-07 DIAGNOSIS — R1011 Right upper quadrant pain: Secondary | ICD-10-CM | POA: Diagnosis not present

## 2017-07-07 DIAGNOSIS — R079 Chest pain, unspecified: Secondary | ICD-10-CM

## 2017-07-07 DIAGNOSIS — R195 Other fecal abnormalities: Secondary | ICD-10-CM | POA: Diagnosis not present

## 2017-07-07 NOTE — Telephone Encounter (Signed)
-----   Message from Hubbard Hartshorn, [place] sent at 07/07/2017  1:20 PM EST ----- Please call patient and let her know that her labs so far are normal, hepatitis panel is pending.  Her [place] was also normal.  I have placed an order for a chest Xray to rule out right lower lobe pneumonia, and an order for a HIDA scan to further evaluate her gall bladder - the [place] did not find inflammation or stones, but the HIDA scan can evaluate how well the gallbladder is functioning. Thank you!

## 2017-07-07 NOTE — Telephone Encounter (Signed)
I contacted this patient to review the results of her most recent labs and to inform her that Raquel Sarna has placed an order for additional testing. After she verified her date of birth, labs were reviewed and patient was told that we will give her a call back when the HIDA scan has been scheduled.

## 2017-07-08 LAB — HEPATITIS PANEL, ACUTE
HCV Ab: <0.1 s/co ratio (ref 0.0–0.9)
HEP A IGM: NEGATIVE
HEP B S AG: NEGATIVE
Hep B C IgM: NEGATIVE

## 2017-07-10 IMAGING — MG MM DIGITAL SCREENING BILAT W/ CAD
4 series · 4 of 4 positions shown · non-contrast
Comparison: Previous exam(s).

CLINICAL DATA: Screening.

EXAM:
DIGITAL SCREENING BILATERAL MAMMOGRAM WITH CAD

[L CC]
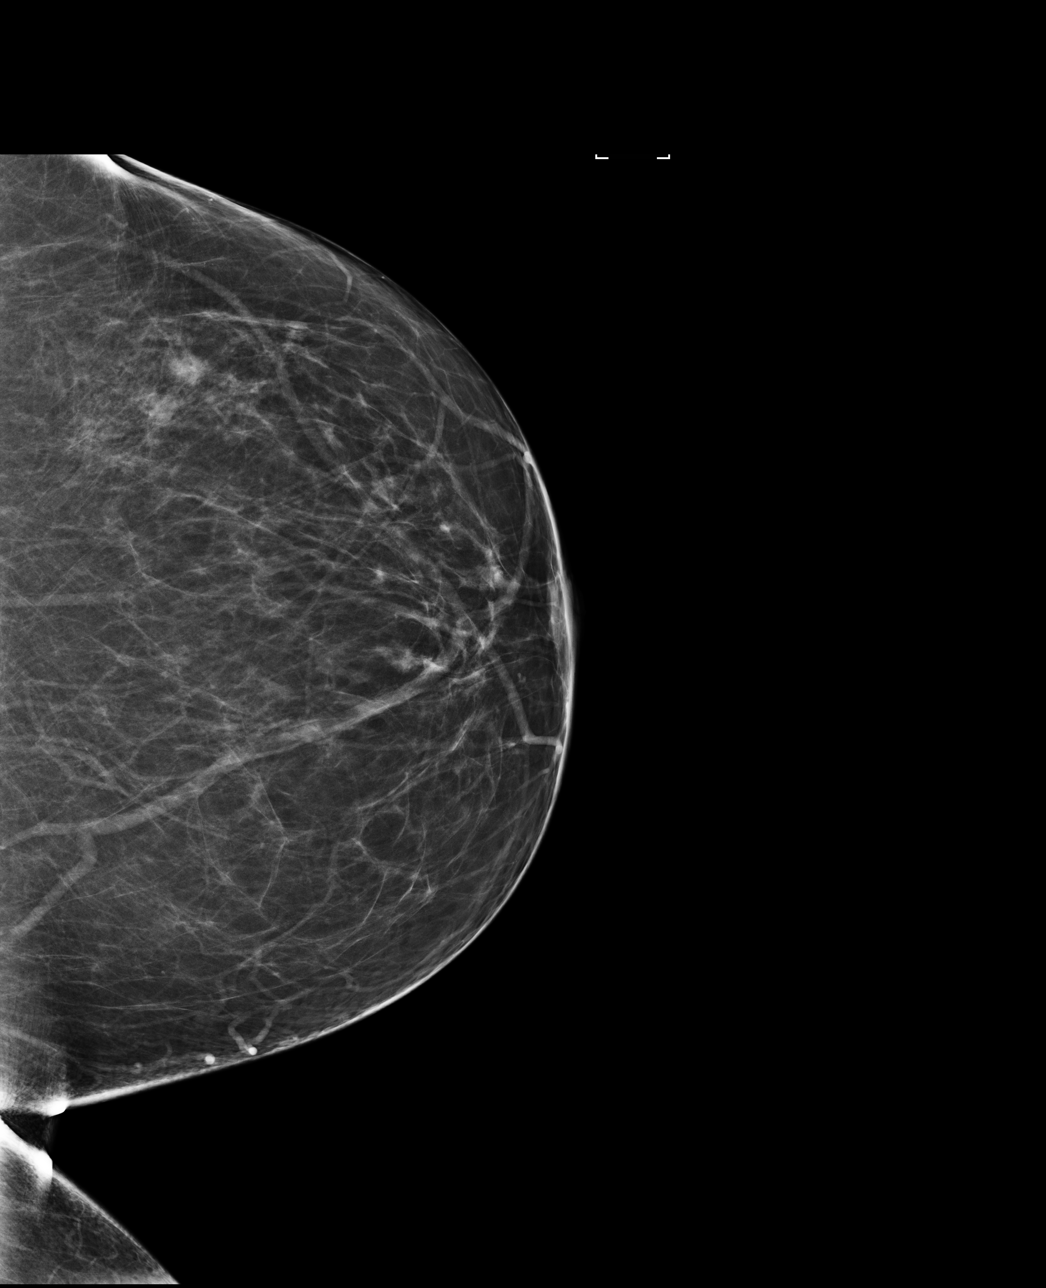

[L MLO]
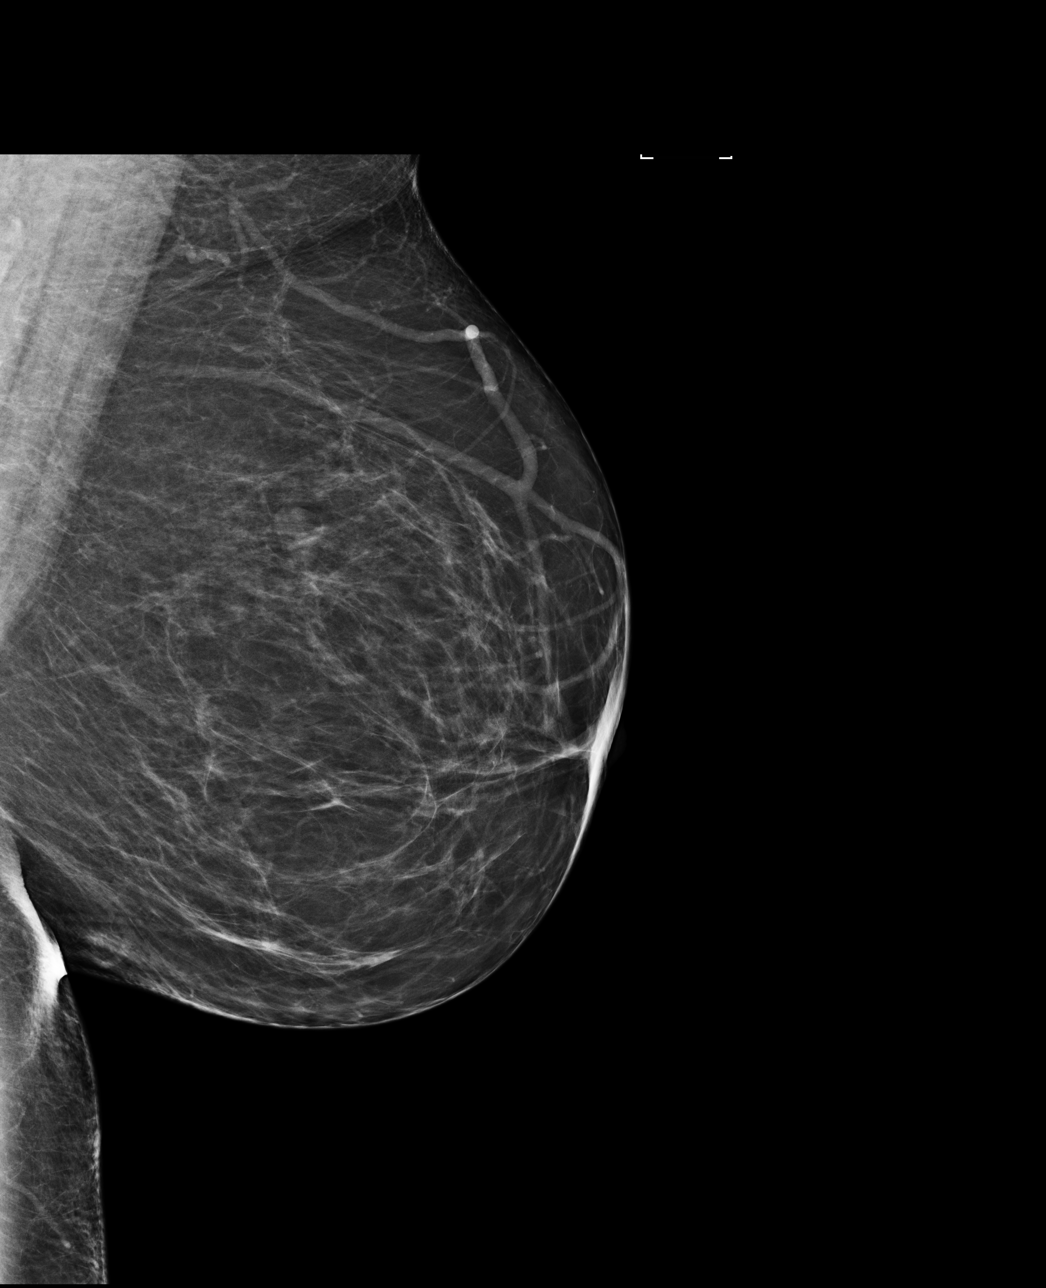

[R MLO]
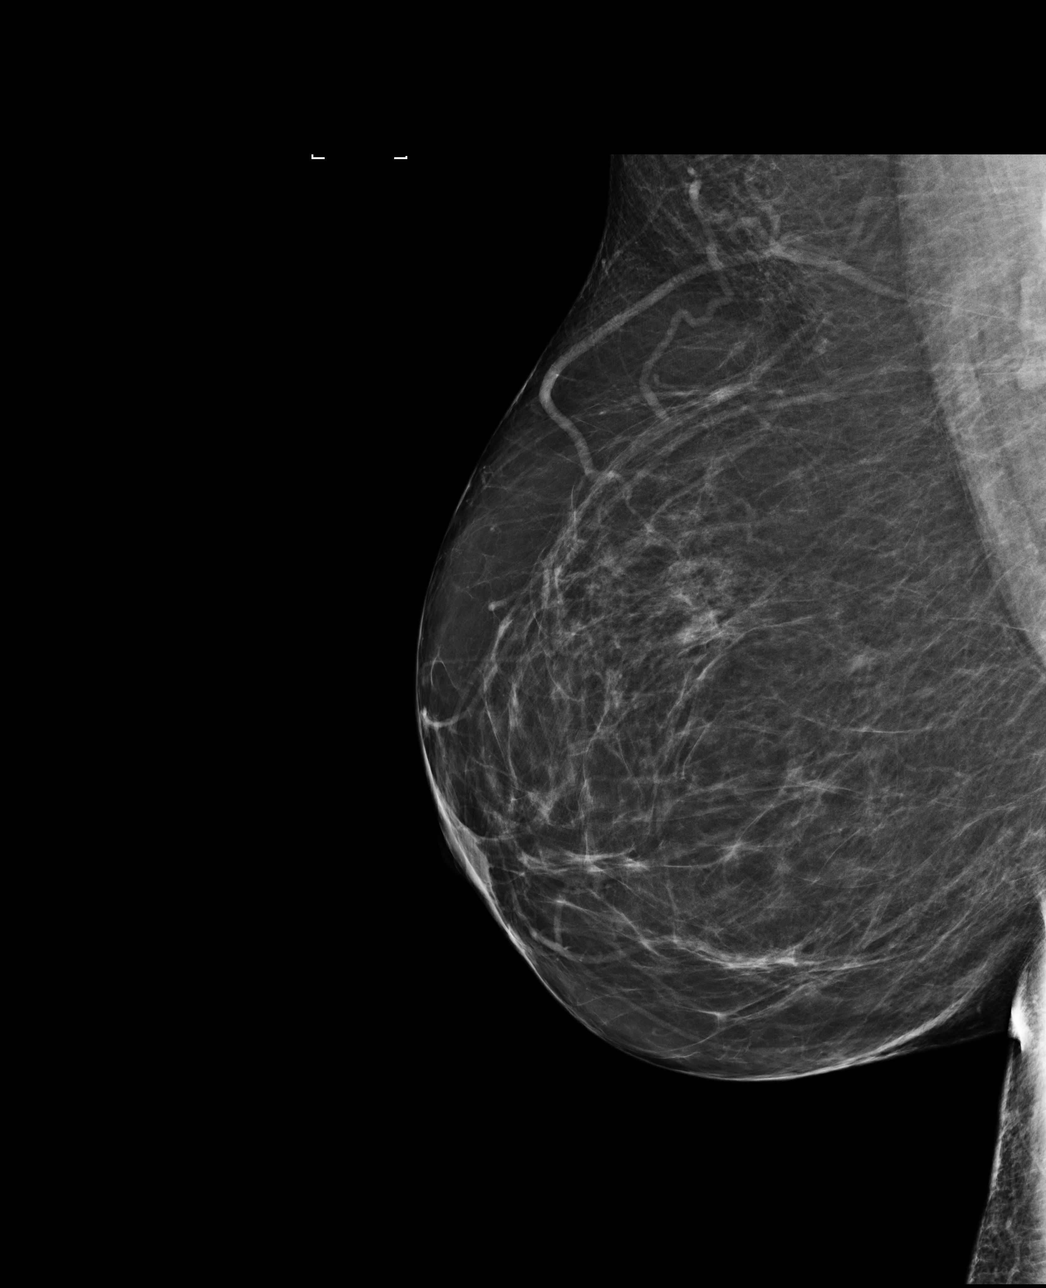

[R CC]
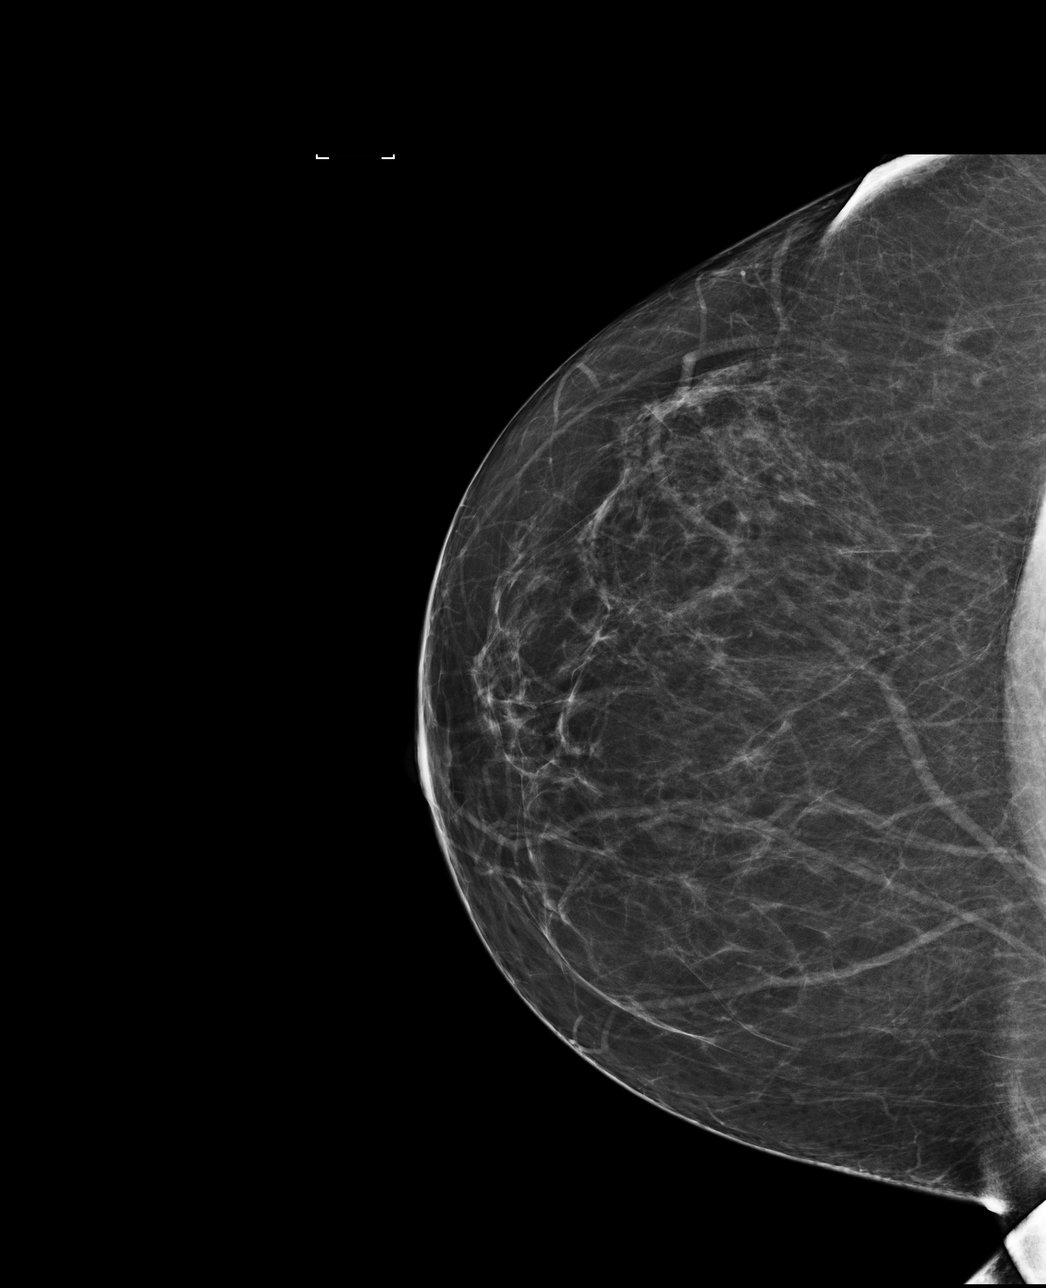

[4 of 4 positions shown; findings below may reference images not displayed]

ACR Breast Density Category b: There are scattered areas of
fibroglandular density.
FINDINGS: There are no findings suspicious for malignancy. Images were
processed with CAD.
IMPRESSION: No mammographic evidence of malignancy. A result letter of this
screening mammogram will be mailed directly to the patient.

RECOMMENDATION:
Screening mammogram in one year. (Code:AS-G-LCT)

BI-RADS CATEGORY  1: Negative.

## 2017-08-12 ENCOUNTER — Ambulatory Visit: Payer: BLUE CROSS/BLUE SHIELD | Admitting: Family Medicine

## 2017-08-12 ENCOUNTER — Encounter: Payer: Self-pay | Admitting: Family Medicine

## 2017-08-12 VITALS — BP 130/80 | HR 84 | Resp 16 | Ht 62.0 in | Wt 198.9 lb

## 2017-08-12 DIAGNOSIS — M5416 Radiculopathy, lumbar region: Secondary | ICD-10-CM | POA: Diagnosis not present

## 2017-08-12 DIAGNOSIS — F410 Panic disorder [episodic paroxysmal anxiety] without agoraphobia: Secondary | ICD-10-CM

## 2017-08-12 DIAGNOSIS — E8881 Metabolic syndrome: Secondary | ICD-10-CM

## 2017-08-12 DIAGNOSIS — M4802 Spinal stenosis, cervical region: Secondary | ICD-10-CM

## 2017-08-12 DIAGNOSIS — K219 Gastro-esophageal reflux disease without esophagitis: Secondary | ICD-10-CM | POA: Diagnosis not present

## 2017-08-12 DIAGNOSIS — G43409 Hemiplegic migraine, not intractable, without status migrainosus: Secondary | ICD-10-CM

## 2017-08-12 DIAGNOSIS — I10 Essential (primary) hypertension: Secondary | ICD-10-CM

## 2017-08-12 DIAGNOSIS — Z23 Encounter for immunization: Secondary | ICD-10-CM | POA: Diagnosis not present

## 2017-08-12 MED ORDER — NORTRIPTYLINE HCL 10 MG PO CAPS: 10.0000 mg | ORAL_CAPSULE | Freq: Every day | ORAL | 1 refills | Status: DC

## 2017-08-12 MED ORDER — HYDROCHLOROTHIAZIDE 25 MG PO TABS: 25.0000 mg | ORAL_TABLET | Freq: Every day | ORAL | 1 refills | Status: DC

## 2017-08-12 MED ORDER — OMEPRAZOLE 20 MG PO CPDR: 20.0000 mg | DELAYED_RELEASE_CAPSULE | Freq: Every day | ORAL | 1 refills | Status: DC

## 2017-08-12 MED ORDER — ALPRAZOLAM 0.5 MG PO TABS: 0.5000 mg | ORAL_TABLET | Freq: Every day | ORAL | 0 refills | Status: DC | PRN

## 2017-08-12 NOTE — Progress Notes (Signed)
Name: Monique Daniels   MRN: [id number]    DOB: 07/01/1961   Date:08/12/2017       Progress Note  Subjective  Chief Complaint  Chief Complaint  Patient presents with  . Medication Refill  . Migraine    HPI  Low back pain with radiculitis: symptoms started over one year ago. She had MRI last year, she has been exercising and losing weight and is doing well, she has mild low back pain, but able to exercise and no radiculitis.   HTN: patient is taking medication daily, denies side effects. No chest pain, or palpitation. BP is at goal.   GERD: under control with medication, no heartburn, no regurgitation since esophageal dilation 2016, down to one Prilosec daily , she is doing well at this time.   Migraine: hemiplegic, she is no longer taking Topamax, back on Pamelor at night, she has some tension headaches and allergies lately, but last episode of migraine was about 2 weeks ago. She states she was having too much tingling on Topamax and she is no longer taking it. She is on Nortriptyline and is doing well . Migraine is described as a dull toothache usually on top of her head and radiates to right jaw, and gets some blurred vision, no nausea or vomiting, but at times has photophobia and phonophobia.   Metabolic Syndrome: she is gaining  weight, denies polyphagia, polydipsia or polyuria. She has been going to the gym, training for a 5 k this fall. Excited about her weight loss.   Cervical spinal stenosis: referral made to neurosurgeon but she never got a phone call. She states neck pain has improved, no longer has radiculitis.   Panic attack: seldom, last alprazolam was filled two years ago, we will give a refill today    Patient Active Problem List   Diagnosis Date Noted  . Cervical spinal stenosis 04/08/2016  . Prediabetes 01/09/2016  . Dyslipidemia 10/03/2015  . Seasonal allergic rhinitis 04/29/2015  . History of esophageal stricture 01/28/2015  . Atypical nevus 01/27/2015   . Carpal tunnel syndrome 01/27/2015  . Insomnia, persistent 01/27/2015  . DDD (degenerative disc disease), cervical 01/27/2015  . Panic attacks 01/27/2015  . Gastric reflux 01/27/2015  . Headache, hemiplegic migraine 01/27/2015  . H/O transient cerebral ischemia 01/27/2015  . Hypertension, benign 01/27/2015  . Calculus of kidney 01/27/2015  . Adult BMI 30+ 01/27/2015  . Vitamin D deficiency 01/27/2015  . Snores 01/27/2015    Past Surgical History:  Procedure Laterality Date  . HYSTEROSCOPY W/D&C N/A 02/07/2015   Procedure: DILATATION AND CURETTAGE /HYSTEROSCOPY;  Surgeon: Will Bonnet, MD;  Location: [facility] ORS;  Service: Gynecology;  Laterality: N/A;  . HYSTEROSCOPY WITH NOVASURE N/A 02/07/2015   Procedure: HYSTEROSCOPY WITH NOVASURE;  Surgeon: Will Bonnet, MD;  Location: [facility] ORS;  Service: Gynecology;  Laterality: N/A;  . TOOTH EXTRACTION    . TUBAL LIGATION  1989    Family History  Problem Relation Age of Onset  . Asthma Mother   . Heart disease Mother   . Hyperlipidemia Mother   . Emphysema Mother   . Tremor Mother   . Macular degeneration Mother   . Asthma Son   . Heart attack Father   . Cancer Father        Unknown  . Urolithiasis Son   . Tremor Sister   . Tremor Brother   . Macular degeneration Maternal Grandmother   . Alzheimer's disease Paternal Grandfather   . Breast cancer Neg  Hx     Social History   Socioeconomic History  . Marital status: Married    Spouse name: Merry Proud  . Number of children: 2  . Years of education: 12th  . Highest education level: Not on file  Social Needs  . Financial resource strain: Not on file  . Food insecurity - worry: Not on file  . Food insecurity - inability: Not on file  . Transportation needs - medical: Not on file  . Transportation needs - non-medical: Not on file  Occupational History  . Occupation: Medical records    Employer: [employer]  Tobacco Use  . Smoking status: Never Smoker  .  Smokeless tobacco: Never Used  Substance and Sexual Activity  . Alcohol use: No    Alcohol/week: 0.0 - 0.6 oz  . Drug use: No  . Sexual activity: Yes    Partners: Male    Comment: BTL  Other Topics Concern  . Not on file  Social History Narrative   Patient lives at home with husband Merry Proud.    Patient works at [employer].    Patient has a high school education.    Patient has 2 children.      Current Outpatient Medications:  .  ALPRAZolam (XANAX) 0.5 MG tablet, Take 1 tablet (0.5 mg total) by mouth daily as needed for anxiety (05-1 tablet)., Disp: 30 tablet, Rfl: 0 .  aspirin-acetaminophen-caffeine (EXCEDRIN MIGRAINE) 250-250-65 MG per tablet, Take 2 tablets by mouth every 6 (six) hours as needed for pain., Disp: , Rfl:  .  cyclobenzaprine (FLEXERIL) 10 MG tablet, Take 0.5-1 tablets (5-10 mg total) by mouth at bedtime as needed., Disp: 30 tablet, Rfl: 0 .  hydrochlorothiazide (HYDRODIURIL) 25 MG tablet, Take 1 tablet (25 mg total) by mouth daily., Disp: 90 tablet, Rfl: 1 .  Multiple Vitamins-Minerals (CENTRUM SILVER ADULT 50+ PO), Take 1 tablet by mouth daily., Disp: , Rfl:  .  nortriptyline (PAMELOR) 10 MG capsule, TAKE 1 CAPSULE (10 MG TOTAL) BY MOUTH AT BEDTIME., Disp: 90 capsule, Rfl: 0 .  omeprazole (PRILOSEC) 20 MG capsule, TAKE 1 CAPSULE (20 MG TOTAL) BY MOUTH DAILY., Disp: 90 capsule, Rfl: 0 .  sodium chloride (OCEAN) 0.65 % nasal spray, Place 1 spray into both nostrils daily as needed (for allergies). , Disp: , Rfl:  .  TURMERIC CURCUMIN PO, Take 1 capsule by mouth daily. 900 mg daily, Disp: , Rfl:   Allergies  Allergen Reactions  . Claritin  [Loratadine] Other (See Comments)  . Codeine Other (See Comments)    Severe headache   . Entex Lq  [Phenylephrine-Guaifenesin] Other (See Comments)  . Fexofenadine Other (See Comments)  . Loratadine-Pseudoephedrine Er Other (See Comments)  . Pseudoephedrine Other (See Comments)  . Tramadol Nausea Only      ROS  Constitutional: Negative for fever, positive for mild  weight change.  Respiratory: Negative for cough and shortness of breath.   Cardiovascular: Negative for chest pain or palpitations.  Gastrointestinal: Negative for abdominal pain, no bowel changes.  Musculoskeletal: Negative for gait problem or joint swelling.  Skin: Negative for rash.  Neurological: Negative for dizziness or headache.  No other specific complaints in a complete review of systems (except as listed in HPI above).  Objective  Vitals:   08/12/17 0853  BP: 130/80  Pulse: 84  Resp: 16  SpO2: 98%  Weight: 198 lb 14.4 oz (90.2 kg)  Height: _0  (1.575 m)    Body mass index is 36.38 kg/m.  Physical Exam  Constitutional: Patient appears well-developed and well-nourished. Obese  No distress.  HEENT: head atraumatic, normocephalic, pupils equal and reactive to light, neck supple, throat within normal limits Cardiovascular: Normal rate, regular rhythm and normal heart sounds.  No murmur heard. No BLE edema. Pulmonary/Chest: Effort normal and breath sounds normal. No respiratory distress. Abdominal: Soft.  There is no tenderness. Psychiatric: Patient has a normal mood and affect. behavior is normal. Judgment and thought content normal.  Recent Results (from the past 2160 hour(s))  POCT urinalysis dipstick     Status: Abnormal   Collection Time: 07/06/17  5:07 PM  Result Value Ref Range   Color, UA amber    Clarity, UA clear    Glucose, UA negative    Bilirubin, UA negative    Ketones, UA negative    Spec Grav, UA >=1.030 (A) 1.010 - 1.025   Blood, UA non-hemolyzed trace    pH, UA 6.0 5.0 - 8.0   Protein, UA trace    Urobilinogen, UA 0.2 0.2 or 1.0 E.U./dL   Nitrite, UA negative    Leukocytes, UA Small (1+) (A) Negative  CBC with Differential/Platelet     Status: Abnormal   Collection Time: 07/06/17  6:11 PM  Result Value Ref Range   WBC 5.9 3.6 - 11.0 K/uL   RBC 4.55 3.80 - 5.20 MIL/uL    Hemoglobin 13.2 12.0 - 16.0 g/dL   HCT 39.2 35.0 - 47.0 %   MCV 86.2 80.0 - 100.0 fL   MCH 28.9 26.0 - 34.0 pg   MCHC 33.6 32.0 - 36.0 g/dL   RDW 13.8 11.5 - 14.5 %   Platelets 247 150 - 440 K/uL   Neutrophils Relative % 46 %   Neutro Abs 2.7 1.4 - 6.5 K/uL   Lymphocytes Relative 34 %   Lymphs Abs 2.0 1.0 - 3.6 K/uL   Monocytes Relative 9 %   Monocytes Absolute 0.5 0.2 - 0.9 K/uL   Eosinophils Relative 8 %   Eosinophils Absolute 0.5 0 - 0.7 K/uL   Basophils Relative 3 %   Basophils Absolute 0.2 (H) 0 - 0.1 K/uL  Hepatitis panel, acute     Status: None   Collection Time: 07/06/17  6:11 PM  Result Value Ref Range   Hepatitis B Surface Ag Negative Negative   HCV Ab <0.1 0.0 - 0.9 s/co ratio    Comment: (NOTE)                                  Negative:     < 0.8                             Indeterminate: 0.8 - 0.9                                  Positive:     > 0.9 The CDC recommends that a positive HCV antibody result be followed up with a HCV Nucleic Acid Amplification test (294765). Performed At: [facility] [place], [place] [id number] Rush Farmer MD [place]:[id number]    Hep A IgM Negative Negative   Hep B C IgM Negative Negative  Comprehensive metabolic panel     Status: Abnormal   Collection Time: 07/06/17  6:11 PM  Result Value Ref Range  Sodium 141 135 - 145 mmol/L   Potassium 3.9 3.5 - 5.1 mmol/L   Chloride 107 101 - 111 mmol/L   CO2 27 22 - 32 mmol/L   Glucose, Bld 87 65 - 99 mg/dL   BUN 13 6 - 20 mg/dL   Creatinine, Ser 0.69 0.44 - 1.00 mg/dL   Calcium 8.6 (L) 8.9 - 10.3 mg/dL   Total Protein 6.8 6.5 - 8.1 g/dL   Albumin 3.7 3.5 - 5.0 g/dL   AST 20 15 - 41 U/L   ALT 18 14 - 54 U/L   Alkaline Phosphatase 58 38 - 126 U/L   Total Bilirubin 0.3 0.3 - 1.2 mg/dL   GFR calc non Af Amer >60 >60 mL/min   GFR calc Af Amer >60 >60 mL/min    Comment: (NOTE) The eGFR has been calculated using the CKD EPI equation. This calculation has not been  validated in all clinical situations. eGFR's persistently <60 mL/min signify possible Chronic Kidney Disease.    Anion gap 7 5 - 15  Lipase, blood     Status: None   Collection Time: 07/06/17  6:11 PM  Result Value Ref Range   Lipase 32 11 - 51 U/L      PHQ2/9: Depression screen [facility] 2/9 07/06/2017 11/30/2016 04/08/2016 01/09/2016 10/03/2015  Decreased Interest 0 0 0 0 0  Down, Depressed, Hopeless 1 0 0 0 0  PHQ - 2 Score 1 0 0 0 0     Fall Risk: Fall Risk  08/12/2017 07/06/2017 11/30/2016 04/08/2016 01/09/2016  Falls in the past year? No Yes No No No  Number falls in past yr: - 1 - - -  Comment - at church while putting up [employer] - - -  Injury with Fall? - No - - -     Functional Status Survey: Is the patient deaf or have difficulty hearing?: No Does the patient have difficulty seeing, even when wearing glasses/contacts?: No Does the patient have difficulty concentrating, remembering, or making decisions?: No Does the patient have difficulty walking or climbing stairs?: No Does the patient have difficulty dressing or bathing?: No Does the patient have difficulty doing errands alone such as visiting a doctor's office or shopping?: No    Assessment & Plan   1. Hypertension, benign  - hydrochlorothiazide (HYDRODIURIL) 25 MG tablet; Take 1 tablet (25 mg total) by mouth daily.  Dispense: 90 tablet; Refill: 1  2. Flu vaccine need  - Flu Vaccine QUAD 6+ mos PF IM (Fluarix Quad PF)  3. Panic attacks  - ALPRAZolam (XANAX) 0.5 MG tablet; Take 1 tablet (0.5 mg total) by mouth daily as needed for anxiety (05-1 tablet).  Dispense: 30 tablet; Refill: 0  4. Hemiplegic migraine without status migrainosus, not intractable  - nortriptyline (PAMELOR) 10 MG capsule; Take 1 capsule (10 mg total) by mouth at bedtime.  Dispense: 90 capsule; Refill: 1  5. Gastric reflux  -Omeprazole (PRILOSEC) 20 MG capsule; Take 1 capsule (20 mg total) by mouth daily.  Dispense: 90 capsule; Refill:  1    6. Metabolic syndrome  Recheck labs during her physical   7. Cervical spinal stenosis  stable  8. Right lumbar radiculitis  Doing well at this time

## 2017-08-18 ENCOUNTER — Other Ambulatory Visit: Payer: Self-pay | Admitting: Family Medicine

## 2017-08-24 ENCOUNTER — Ambulatory Visit: Payer: BLUE CROSS/BLUE SHIELD | Admitting: Family Medicine

## 2017-08-24 ENCOUNTER — Encounter: Payer: Self-pay | Admitting: Family Medicine

## 2017-08-24 VITALS — BP 140/84 | HR 62 | Resp 14 | Ht 62.0 in | Wt 201.0 lb

## 2017-08-24 DIAGNOSIS — Z1231 Encounter for screening mammogram for malignant neoplasm of breast: Secondary | ICD-10-CM | POA: Diagnosis not present

## 2017-08-24 DIAGNOSIS — N643 Galactorrhea not associated with childbirth: Secondary | ICD-10-CM

## 2017-08-24 DIAGNOSIS — Z1239 Encounter for other screening for malignant neoplasm of breast: Secondary | ICD-10-CM

## 2017-08-24 NOTE — Progress Notes (Signed)
Name: Monique Daniels   MRN: [id number]    DOB: [birth year]   Date:08/24/2017       Progress Note  Subjective  Chief Complaint  Chief Complaint  Patient presents with  . Referral    For mammogram    HPI  Right breast galactorrhea: she states that she has noticed clear nipple discharge a few episodes over the past year, last one a couple of weeks ago -more noticeable and she decided to come in. She denies double vision, no family history of breast cancer, no breast lumps. No change in medication or supplements. No trauma. No fever or chills, no redness on her breast.   Patient Active Problem List   Diagnosis Date Noted  . Cervical spinal stenosis 04/08/2016  . Prediabetes 01/09/2016  . Dyslipidemia 10/03/2015  . Seasonal allergic rhinitis 04/29/2015  . History of esophageal stricture 01/28/2015  . Atypical nevus 01/27/2015  . Carpal tunnel syndrome 01/27/2015  . Insomnia, persistent 01/27/2015  . DDD (degenerative disc disease), cervical 01/27/2015  . Panic attacks 01/27/2015  . Gastric reflux 01/27/2015  . Headache, hemiplegic migraine 01/27/2015  . H/O transient cerebral ischemia 01/27/2015  . Hypertension, benign 01/27/2015  . Calculus of kidney 01/27/2015  . Adult BMI 30+ 01/27/2015  . Vitamin D deficiency 01/27/2015  . Snores 01/27/2015    Social History   Tobacco Use  . Smoking status: Never Smoker  . Smokeless tobacco: Never Used  Substance Use Topics  . Alcohol use: No    Alcohol/week: 0.0 - 0.6 oz     Current Outpatient Medications:  .  ALPRAZolam (XANAX) 0.5 MG tablet, Take 1 tablet (0.5 mg total) by mouth daily as needed for anxiety (05-1 tablet)., Disp: 30 tablet, Rfl: 0 .  aspirin-acetaminophen-caffeine (EXCEDRIN MIGRAINE) 250-250-65 MG per tablet, Take 2 tablets by mouth every 6 (six) hours as needed for pain., Disp: , Rfl:  .  cyclobenzaprine (FLEXERIL) 10 MG tablet, Take 0.5-1 tablets (5-10 mg total) by mouth at bedtime as needed., Disp: 30 tablet,  Rfl: 0 .  hydrochlorothiazide (HYDRODIURIL) 25 MG tablet, Take 1 tablet (25 mg total) by mouth daily., Disp: 90 tablet, Rfl: 1 .  Multiple Vitamins-Minerals (CENTRUM SILVER ADULT 50+ PO), Take 1 tablet by mouth daily., Disp: , Rfl:  .  nortriptyline (PAMELOR) 10 MG capsule, Take 1 capsule (10 mg total) by mouth at bedtime., Disp: 90 capsule, Rfl: 1 .  omeprazole (PRILOSEC) 20 MG capsule, Take 1 capsule (20 mg total) by mouth daily., Disp: 90 capsule, Rfl: 1 .  sodium chloride (OCEAN) 0.65 % nasal spray, Place 1 spray into both nostrils daily as needed (for allergies). , Disp: , Rfl:  .  TURMERIC CURCUMIN PO, Take 1 capsule by mouth daily. 900 mg daily, Disp: , Rfl:   Allergies  Allergen Reactions  . Claritin  [Loratadine] Other (See Comments)  . Codeine Other (See Comments)    Severe headache   . Entex Lq  [Phenylephrine-Guaifenesin] Other (See Comments)  . Fexofenadine Other (See Comments)  . Loratadine-Pseudoephedrine Er Other (See Comments)  . Pseudoephedrine Other (See Comments)  . Tramadol Nausea Only    ROS  Ten systems reviewed and is negative except as mentioned in HPI   Objective  Vitals:   08/24/17 0836  BP: 140/84  Pulse: 62  Resp: 14  SpO2: 98%  Weight: 201 lb (91.2 kg)  Height: _0  (1.575 m)    Body mass index is 36.76 kg/m.    Physical Exam  Constitutional: Patient appears  well-developed and well-nourished. Obese No distress.  HEENT: head atraumatic, normocephalic, pupils equal and reactive to light,  neck supple, throat within normal limits Cardiovascular: Normal rate, regular rhythm and normal heart sounds.  No murmur heard. No BLE edema. Pulmonary/Chest: Effort normal and breath sounds normal. No respiratory distress. Abdominal: Soft.  There is no tenderness. Breast: normal exam, no nipple discharge Psychiatric: Patient has a normal mood and affect. behavior is normal. Judgment and thought content normal.  Recent Results (from the past 2160  hour(s))  POCT urinalysis dipstick     Status: Abnormal   Collection Time: 07/06/17  5:07 PM  Result Value Ref Range   Color, UA amber    Clarity, UA clear    Glucose, UA negative    Bilirubin, UA negative    Ketones, UA negative    Spec Grav, UA >=1.030 (A) 1.010 - 1.025   Blood, UA non-hemolyzed trace    pH, UA 6.0 5.0 - 8.0   Protein, UA trace    Urobilinogen, UA 0.2 0.2 or 1.0 E.U./dL   Nitrite, UA negative    Leukocytes, UA Small (1+) (A) Negative  CBC with Differential/Platelet     Status: Abnormal   Collection Time: 07/06/17  6:11 PM  Result Value Ref Range   WBC 5.9 3.6 - 11.0 K/uL   RBC 4.55 3.80 - 5.20 MIL/uL   Hemoglobin 13.2 12.0 - 16.0 g/dL   HCT 39.2 35.0 - 47.0 %   MCV 86.2 80.0 - 100.0 fL   MCH 28.9 26.0 - 34.0 pg   MCHC 33.6 32.0 - 36.0 g/dL   RDW 13.8 11.5 - 14.5 %   Platelets 247 150 - 440 K/uL   Neutrophils Relative % 46 %   Neutro Abs 2.7 1.4 - 6.5 K/uL   Lymphocytes Relative 34 %   Lymphs Abs 2.0 1.0 - 3.6 K/uL   Monocytes Relative 9 %   Monocytes Absolute 0.5 0.2 - 0.9 K/uL   Eosinophils Relative 8 %   Eosinophils Absolute 0.5 0 - 0.7 K/uL   Basophils Relative 3 %   Basophils Absolute 0.2 (H) 0 - 0.1 K/uL  Hepatitis panel, acute     Status: None   Collection Time: 07/06/17  6:11 PM  Result Value Ref Range   Hepatitis B Surface Ag Negative Negative   HCV Ab <0.1 0.0 - 0.9 s/co ratio    Comment: (NOTE)                                  Negative:     < 0.8                             Indeterminate: 0.8 - 0.9                                  Positive:     > 0.9 The CDC recommends that a positive HCV antibody result be followed up with a HCV Nucleic Acid Amplification test ([id number]). Performed At: [facility] [place], [place] [id number] Rush Farmer MD [id number]    Hep A IgM Negative Negative   Hep B C IgM Negative Negative  Comprehensive metabolic panel     Status: Abnormal   Collection Time: 07/06/17  6:11 PM   Result Value Ref Range  Sodium 141 135 - 145 mmol/L   Potassium 3.9 3.5 - 5.1 mmol/L   Chloride 107 101 - 111 mmol/L   CO2 27 22 - 32 mmol/L   Glucose, Bld 87 65 - 99 mg/dL   BUN 13 6 - 20 mg/dL   Creatinine, Ser 0.69 0.44 - 1.00 mg/dL   Calcium 8.6 (L) 8.9 - 10.3 mg/dL   Total Protein 6.8 6.5 - 8.1 g/dL   Albumin 3.7 3.5 - 5.0 g/dL   AST 20 15 - 41 U/L   ALT 18 14 - 54 U/L   Alkaline Phosphatase 58 38 - 126 U/L   Total Bilirubin 0.3 0.3 - 1.2 mg/dL   GFR calc non Af Amer >60 >60 mL/min   GFR calc Af Amer >60 >60 mL/min    Comment: (NOTE) The eGFR has been calculated using the CKD EPI equation. This calculation has not been validated in all clinical situations. eGFR's persistently <60 mL/min signify possible Chronic Kidney Disease.    Anion gap 7 5 - 15  Lipase, blood     Status: None   Collection Time: 07/06/17  6:11 PM  Result Value Ref Range   Lipase 32 11 - 51 U/L     Assessment & Plan  1. Galactorrhea in female  - Prolactin - TSH - COMPLETE METABOLIC PANEL WITH GFR - CBC with Differential/Platelet - MM Digital Screening; Future - US BREAST LTD UNI RIGHT INC AXILLA; Future  2. Breast cancer screening  - MM Digital Screening; Future

## 2017-08-24 NOTE — Addendum Note (Signed)
Addended by: Saunders Glance A on: 08/24/2017 09:04 AM   Modules accepted: Orders

## 2017-08-25 LAB — CBC WITH DIFFERENTIAL/PLATELET
BASOS ABS: 103 {cells}/uL (ref 0–200)
Basophils Relative: 1.9 %
EOS PCT: 12.3 %
Eosinophils Absolute: 664 cells/uL — ABNORMAL HIGH (ref 15–500)
HCT: 40.2 % (ref 35.0–45.0)
HEMOGLOBIN: 13.6 g/dL (ref 11.7–15.5)
Lymphs Abs: 1620 cells/uL (ref 850–3900)
MCH: 28.8 pg (ref 27.0–33.0)
MCHC: 33.8 g/dL (ref 32.0–36.0)
MCV: 85 fL (ref 80.0–100.0)
MONOS PCT: 9.9 %
MPV: 11 fL (ref 7.5–12.5)
NEUTROS ABS: 2479 {cells}/uL (ref 1500–7800)
Neutrophils Relative %: 45.9 %
Platelets: 270 10*3/uL (ref 140–400)
RBC: 4.73 10*6/uL (ref 3.80–5.10)
RDW: 12.6 % (ref 11.0–15.0)
Total Lymphocyte: 30 %
WBC mixed population: 535 cells/uL (ref 200–950)
WBC: 5.4 10*3/uL (ref 3.8–10.8)

## 2017-08-25 LAB — COMPLETE METABOLIC PANEL WITH GFR
AG RATIO: 1.4 (calc) (ref 1.0–2.5)
ALKALINE PHOSPHATASE (APISO): 56 U/L (ref 33–130)
ALT: 13 U/L (ref 6–29)
AST: 15 U/L (ref 10–35)
Albumin: 3.9 g/dL (ref 3.6–5.1)
BUN: 12 mg/dL (ref 7–25)
CO2: 28 mmol/L (ref 20–32)
Calcium: 9.4 mg/dL (ref 8.6–10.4)
Chloride: 106 mmol/L (ref 98–110)
Creat: 0.75 mg/dL (ref 0.50–1.05)
GFR, EST AFRICAN AMERICAN: 105 mL/min/{1.73_m2} (ref >=60)
GFR, Est Non African American: 90 mL/min/{1.73_m2} (ref >=60)
GLUCOSE: 94 mg/dL (ref 65–99)
Globulin: 2.7 g/dL (calc) (ref 1.9–3.7)
POTASSIUM: 4.3 mmol/L (ref 3.5–5.3)
Sodium: 141 mmol/L (ref 135–146)
Total Bilirubin: 0.5 mg/dL (ref 0.2–1.2)
Total Protein: 6.6 g/dL (ref 6.1–8.1)

## 2017-08-25 LAB — PROLACTIN: PROLACTIN: 7 ng/mL

## 2017-08-25 LAB — TSH: TSH: 1.96 m[IU]/L

## 2017-08-30 ENCOUNTER — Telehealth: Payer: Self-pay | Admitting: Family Medicine

## 2017-08-30 NOTE — Telephone Encounter (Signed)
Completed.

## 2017-09-10 ENCOUNTER — Ambulatory Visit
Admission: RE | Admit: 2017-09-10 | Discharge: 2017-09-10 | Disposition: A | Discharge status: Home / Self Care | Payer: BLUE CROSS/BLUE SHIELD | Source: Ambulatory Visit | Attending: Family Medicine | Admitting: Family Medicine

## 2017-09-10 ENCOUNTER — Other Ambulatory Visit: Payer: Self-pay | Admitting: Family Medicine

## 2017-09-10 DIAGNOSIS — N6320 Unspecified lump in the left breast, unspecified quadrant: Secondary | ICD-10-CM | POA: Diagnosis not present

## 2017-09-10 DIAGNOSIS — N6002 Solitary cyst of left breast: Secondary | ICD-10-CM | POA: Insufficient documentation

## 2017-09-10 DIAGNOSIS — Z1239 Encounter for other screening for malignant neoplasm of breast: Secondary | ICD-10-CM

## 2017-09-10 DIAGNOSIS — R922 Inconclusive mammogram: Secondary | ICD-10-CM | POA: Diagnosis not present

## 2017-09-10 DIAGNOSIS — N643 Galactorrhea not associated with childbirth: Secondary | ICD-10-CM

## 2017-09-10 DIAGNOSIS — N6489 Other specified disorders of breast: Secondary | ICD-10-CM | POA: Diagnosis not present

## 2017-09-10 DIAGNOSIS — R928 Other abnormal and inconclusive findings on diagnostic imaging of breast: Secondary | ICD-10-CM | POA: Diagnosis not present

## 2017-11-24 DIAGNOSIS — R197 Diarrhea, unspecified: Secondary | ICD-10-CM | POA: Diagnosis not present

## 2017-11-24 DIAGNOSIS — Z8719 Personal history of other diseases of the digestive system: Secondary | ICD-10-CM | POA: Diagnosis not present

## 2017-11-24 DIAGNOSIS — R109 Unspecified abdominal pain: Secondary | ICD-10-CM | POA: Diagnosis not present

## 2017-11-30 ENCOUNTER — Encounter: Payer: Self-pay | Admitting: Family Medicine

## 2017-11-30 ENCOUNTER — Ambulatory Visit (INDEPENDENT_AMBULATORY_CARE_PROVIDER_SITE_OTHER): Payer: BLUE CROSS/BLUE SHIELD | Admitting: Family Medicine

## 2017-11-30 VITALS — BP 130/82 | HR 51 | Resp 14 | Ht 62.0 in | Wt 183.6 lb

## 2017-11-30 DIAGNOSIS — Z01419 Encounter for gynecological examination (general) (routine) without abnormal findings: Secondary | ICD-10-CM

## 2017-11-30 DIAGNOSIS — Z1231 Encounter for screening mammogram for malignant neoplasm of breast: Secondary | ICD-10-CM | POA: Diagnosis not present

## 2017-11-30 DIAGNOSIS — Z1239 Encounter for other screening for malignant neoplasm of breast: Secondary | ICD-10-CM

## 2017-11-30 LAB — LIPID PANEL
CHOL/HDL RATIO: 4.6 (calc) (ref <5.0)
Cholesterol: 192 mg/dL (ref <200)
HDL: 42 mg/dL — ABNORMAL LOW (ref >50)
LDL CHOLESTEROL (CALC): 130 mg/dL — AB
NON-HDL CHOLESTEROL (CALC): 150 mg/dL — AB (ref <130)
TRIGLYCERIDES: 94 mg/dL (ref <150)

## 2017-11-30 NOTE — Patient Instructions (Signed)
Preventive Care 40-64 Years, Female Preventive care refers to lifestyle choices and visits with your health care provider that can promote health and wellness. What does preventive care include?  A yearly physical exam. This is also called an annual well check.  Dental exams once or twice a year.  Routine eye exams. Ask your health care provider how often you should have your eyes checked.  Personal lifestyle choices, including: ? Daily care of your teeth and gums. ? Regular physical activity. ? Eating a healthy diet. ? Avoiding tobacco and drug use. ? Limiting alcohol use. ? Practicing safe sex. ? Taking low-dose aspirin daily starting at age 55. ? Taking vitamin and mineral supplements as recommended by your health care provider. What happens during an annual well check? The services and screenings done by your health care provider during your annual well check will depend on your age, overall health, lifestyle risk factors, and family history of disease. Counseling Your health care provider may ask you questions about your:  Alcohol use.  Tobacco use.  Drug use.  Emotional well-being.  Home and relationship well-being.  Sexual activity.  Eating habits.  Work and work Statistician.  Method of birth control.  Menstrual cycle.  Pregnancy history.  Screening You may have the following tests or measurements:  Height, weight, and BMI.  Blood pressure.  Lipid and cholesterol levels. These may be checked every 5 years, or more frequently if you are over 55 years old.  Skin check.  Lung cancer screening. You may have this screening every year starting at age 55 if you have a 30-pack-year history of smoking and currently smoke or have quit within the past 15 years.  Fecal occult blood test (FOBT) of the stool. You may have this test every year starting at age 55.  Flexible sigmoidoscopy or colonoscopy. You may have a sigmoidoscopy every 5 years or a colonoscopy  every 10 years starting at age 55.  Hepatitis C blood test.  Hepatitis B blood test.  Sexually transmitted disease (STD) testing.  Diabetes screening. This is done by checking your blood sugar (glucose) after you have not eaten for a while (fasting). You may have this done every 1-3 years.  Mammogram. This may be done every 1-2 years. Talk to your health care provider about when you should start having regular mammograms. This may depend on whether you have a family history of breast cancer.  BRCA-related cancer screening. This may be done if you have a family history of breast, ovarian, tubal, or peritoneal cancers.  Pelvic exam and Pap test. This may be done every 3 years starting at age 55. Starting at age 36, this may be done every 5 years if you have a Pap test in combination with an HPV test.  Bone density scan. This is done to screen for osteoporosis. You may have this scan if you are at high risk for osteoporosis.  Discuss your test results, treatment options, and if necessary, the need for more tests with your health care provider. Vaccines Your health care provider may recommend certain vaccines, such as:  Influenza vaccine. This is recommended every year.  Tetanus, diphtheria, and acellular pertussis (Tdap, Td) vaccine. You may need a Td booster every 10 years.  Varicella vaccine. You may need this if you have not been vaccinated.  Zoster vaccine. You may need this after age 55.  Measles, mumps, and rubella (MMR) vaccine. You may need at least one dose of MMR if you were born in  1955 or later. You may also need a second dose.  Pneumococcal 13-valent conjugate (PCV13) vaccine. You may need this if you have certain conditions and were not previously vaccinated.  Pneumococcal polysaccharide (PPSV23) vaccine. You may need one or two doses if you smoke cigarettes or if you have certain conditions.  Meningococcal vaccine. You may need this if you have certain  conditions.  Hepatitis A vaccine. You may need this if you have certain conditions or if you travel or work in places where you may be exposed to hepatitis A.  Hepatitis B vaccine. You may need this if you have certain conditions or if you travel or work in places where you may be exposed to hepatitis B.  Haemophilus influenzae type b (Hib) vaccine. You may need this if you have certain conditions.  Talk to your health care provider about which screenings and vaccines you need and how often you need them. This information is not intended to replace advice given to you by your health care provider. Make sure you discuss any questions you have with your health care provider. Document Released: 08/16/2015 Document Revised: 04/08/2016 Document Reviewed: 05/21/2015 Elsevier Interactive Patient Education  2018 Elsevier Inc.  

## 2017-11-30 NOTE — Progress Notes (Signed)
Name: Monique Daniels   MRN: [id number]    DOB: 1961/02/25   Date:11/30/2017       Progress Note  Subjective  Chief Complaint  Chief Complaint  Patient presents with  . Annual Exam    HPI   Patient presents for annual CPE   Diet: balanced and healthy  Exercise: going to gym and is doing   USPSTF grade A and B recommendations  Depression:  Depression screen [facility] 2/9 11/30/2017 07/06/2017 11/30/2016 04/08/2016 01/09/2016  Decreased Interest 0 0 0 0 0  Down, Depressed, Hopeless 0 1 0 0 0  PHQ - 2 Score 0 1 0 0 0   Hypertension: BP Readings from Last 3 Encounters:  11/30/17 130/82  08/24/17 140/84  08/12/17 130/80   Obesity: Wt Readings from Last 3 Encounters:  11/30/17 183 lb 9.6 oz (83.3 kg)  08/24/17 201 lb (91.2 kg)  08/12/17 198 lb 14.4 oz (90.2 kg)   BMI Readings from Last 3 Encounters:  11/30/17 33.58 kg/m  08/24/17 36.76 kg/m  08/12/17 36.38 kg/m    Alcohol: never a heavy drinker, not drinking at all now Tobacco use: never HIV, hep B, hep C: N/A STD testing and prevention (chl/gon/syphilis): not interested  Intimate partner violence:negative screen Sexual History/Pain during Intercourse: no pain during intercourse Menstrual History/LMP/Abnormal Bleeding:none  Incontinence Symptoms: has to wear a a panty liner during work outs, discussed kegel exercises  Advanced Care Planning: A voluntary discussion about advance care planning including the explanation and discussion of advance directives.  Discussed health care proxy and Living will, and the patient was able to identify a health care proxy as husband Patient does not have a living will at present time. If patient does have living will, I have requested they bring this to the clinic to be scanned in to their chart.  Breast cancer: schedule  BRCA gene screening: no significant history  Cervical cancer screening: 2017, repeat next year   Osteoporosis: discussed high calcium and high vitamin D also physical  activity to prevent osteoporosis.    Fall prevention/vitamin D: discussed  Lipids:  Lab Results  Component Value Date   CHOL 182 11/30/2016   CHOL 206 (H) 11/05/2015   CHOL 206 (A) 05/04/2014   Lab Results  Component Value Date   HDL 54 11/30/2016   HDL 51 11/05/2015   HDL 41 05/04/2014   Lab Results  Component Value Date   LDLCALC 116 (H) 11/30/2016   LDLCALC 132 (H) 11/05/2015   LDLCALC 140 05/04/2014   Lab Results  Component Value Date   TRIG 59 11/30/2016   TRIG 115 11/05/2015   TRIG 125 05/04/2014   Lab Results  Component Value Date   CHOLHDL 3.4 11/30/2016   CHOLHDL 4.0 11/05/2015   No results found for: LDLDIRECT  Glucose:  Glucose  Date Value Ref Range Status  01/18/2013 88 65 - 99 mg/dL Final  01/12/2013 159 (H) 65 - 99 mg/dL Final  07/06/2012 96 65 - 99 mg/dL Final   Glucose, Bld  Date Value Ref Range Status  08/24/2017 94 65 - 99 mg/dL Final    Comment:    .            Fasting reference interval .   07/06/2017 87 65 - 99 mg/dL Final  11/30/2016 89 65 - 99 mg/dL Final  11/30/2016 89 65 - 99 mg/dL Final   Glucose-Capillary  Date Value Ref Range Status  05/05/2013 80 70 - 99 mg/dL Final  Skin cancer: seen by dermatologist, pre-cancerous - going yearly  Colorectal cancer: 58  years old. Lung cancer:   Low Dose CT Chest recommended if Age 14-80 years, 30 pack-year currently smoking OR have quit w/in 15years. Patient does not qualify.   [id number]   Patient Active Problem List   Diagnosis Date Noted  . Cervical spinal stenosis 04/08/2016  . Prediabetes 01/09/2016  . Dyslipidemia 10/03/2015  . Seasonal allergic rhinitis 04/29/2015  . History of esophageal stricture 01/28/2015  . Atypical nevus 01/27/2015  . Carpal tunnel syndrome 01/27/2015  . Insomnia, persistent 01/27/2015  . DDD (degenerative disc disease), cervical 01/27/2015  . Panic attacks 01/27/2015  . Gastric reflux 01/27/2015  . Headache, hemiplegic migraine 01/27/2015  .  H/O transient cerebral ischemia 01/27/2015  . Hypertension, benign 01/27/2015  . Calculus of kidney 01/27/2015  . Adult BMI 30+ 01/27/2015  . Vitamin D deficiency 01/27/2015  . Snores 01/27/2015    Past Surgical History:  Procedure Laterality Date  . HYSTEROSCOPY W/D&C N/A 02/07/2015   Procedure: DILATATION AND CURETTAGE /HYSTEROSCOPY;  Surgeon: Will Bonnet, MD;  Location: [facility] ORS;  Service: Gynecology;  Laterality: N/A;  . HYSTEROSCOPY WITH NOVASURE N/A 02/07/2015   Procedure: HYSTEROSCOPY WITH NOVASURE;  Surgeon: Will Bonnet, MD;  Location: [facility] ORS;  Service: Gynecology;  Laterality: N/A;  . TOOTH EXTRACTION    . TUBAL LIGATION  1989    Family History  Problem Relation Age of Onset  . Asthma Mother   . Heart disease Mother   . Hyperlipidemia Mother   . Emphysema Mother   . Tremor Mother   . Macular degeneration Mother   . Asthma Son   . Heart attack Father   . Cancer Father        Unknown  . Urolithiasis Son   . Tremor Sister   . Tremor Brother   . Macular degeneration Maternal Grandmother   . Alzheimer's disease Paternal Grandfather   . Breast cancer Neg Hx     Social History   Socioeconomic History  . Marital status: Married    Spouse name: Merry Proud  . Number of children: 2  . Years of education: 12th  . Highest education level: Not on file  Occupational History  . Occupation: Medical records    Employer: [employer]  . Financial resource strain: Not hard at all  . Food insecurity:    Worry: Never true    Inability: Never true  . Transportation needs:    Medical: No    Non-medical: No  Tobacco Use  . Smoking status: Never Smoker  . Smokeless tobacco: Never Used  Substance and Sexual Activity  . Alcohol use: Not Currently    Comment: used to drink wine very seldom   . Drug use: No  . Sexual activity: Yes    Partners: Male    Comment: BTL  Lifestyle  . Physical activity:    Days per week: 5 days    Minutes per  session: 140 min  . Stress: Not at all  Relationships  . Social connections:    Talks on phone: More than three times a week    Gets together: Twice a week    Attends religious service: More than 4 times per year    Active member of club or organization: No    Attends meetings of clubs or organizations: Never    Relationship status: Married  . Intimate partner violence:    Fear of current or ex  partner: No    Emotionally abused: No    Physically abused: No    Forced sexual activity: No  Other Topics Concern  . Not on file  Social History Narrative   Patient lives at home with husband Merry Proud.    Patient works at [employer].    Patient has a high school education.    Patient has 2 children.      Current Outpatient Medications:  .  ALPRAZolam (XANAX) 0.5 MG tablet, Take 1 tablet (0.5 mg total) by mouth daily as needed for anxiety (05-1 tablet)., Disp: 30 tablet, Rfl: 0 .  amoxicillin-clavulanate (AUGMENTIN) 875-125 MG tablet, TAKE 1 TABLET BY MOUTH TWICE A DAY FOR 10 DAYS WITH FOOD, Disp: , Rfl: 0 .  aspirin-acetaminophen-caffeine (EXCEDRIN MIGRAINE) 250-250-65 MG per tablet, Take 2 tablets by mouth every 6 (six) hours as needed for pain., Disp: , Rfl:  .  cyclobenzaprine (FLEXERIL) 10 MG tablet, Take 0.5-1 tablets (5-10 mg total) by mouth at bedtime as needed., Disp: 30 tablet, Rfl: 0 .  hydrochlorothiazide (HYDRODIURIL) 25 MG tablet, Take 1 tablet (25 mg total) by mouth daily., Disp: 90 tablet, Rfl: 1 .  Multiple Vitamins-Minerals (CENTRUM SILVER ADULT 50+ PO), Take 1 tablet by mouth daily., Disp: , Rfl:  .  nortriptyline (PAMELOR) 10 MG capsule, Take 1 capsule (10 mg total) by mouth at bedtime., Disp: 90 capsule, Rfl: 1 .  omeprazole (PRILOSEC) 20 MG capsule, Take 1 capsule (20 mg total) by mouth daily., Disp: 90 capsule, Rfl: 1 .  sodium chloride (OCEAN) 0.65 % nasal spray, Place 1 spray into both nostrils daily as needed (for allergies). , Disp: , Rfl:  .  TURMERIC  CURCUMIN PO, Take 1 capsule by mouth daily. 900 mg daily, Disp: , Rfl:   Allergies  Allergen Reactions  . Claritin  [Loratadine] Other (See Comments)  . Codeine Other (See Comments)    Severe headache   . Entex Lq  [Phenylephrine-Guaifenesin] Other (See Comments)  . Fexofenadine Other (See Comments)  . Loratadine-Pseudoephedrine Er Other (See Comments)  . Pseudoephedrine Other (See Comments)  . Tramadol Nausea Only     ROS   Constitutional: Negative for fever, positive for  weight change.  Respiratory: Negative for cough and shortness of breath.   Cardiovascular: Negative for chest pain or palpitations.  Gastrointestinal: Negative for abdominal pain, positive bowel changes - recently went to Urgent care, dx with diverticulitis, taking antibiotics and is doing much better now, no bowel movements this am and abdominal pain resolved.  Musculoskeletal: Negative for gait problem or joint swelling.  Skin: Negative for rash.  Neurological: Negative for dizziness or headache.  No other specific complaints in a complete review of systems (except as listed in HPI above).   Objective  Vitals:   11/30/17 0832  BP: 130/82  Pulse: (!) 51  Resp: 14  SpO2: 97%  Weight: 183 lb 9.6 oz (83.3 kg)  Height: '5\' 2"'  (1.575 m)    Body mass index is 33.58 kg/m.  Physical Exam  Constitutional: Patient appears well-developed and obese.  No distress.  HENT: Head: Normocephalic and atraumatic. Ears: B TMs ok, no erythema or effusion; Nose: Nose normal. Mouth/Throat: Oropharynx is clear and moist. No oropharyngeal exudate.  Eyes: Conjunctivae and EOM are normal. Pupils are equal, round, and reactive to light. No scleral icterus.  Neck: Normal range of motion. Neck supple. No JVD present. No thyromegaly present.  Cardiovascular: Normal rate, regular rhythm and normal heart sounds.  No murmur heard.  No BLE edema. Pulmonary/Chest: Effort normal and breath sounds normal. No respiratory  distress. Abdominal: Soft. Bowel sounds are normal, no distension. There is no tenderness. no masses Breast: no lumps or masses, no nipple discharge or rashes FEMALE GENITALIA: not done RECTAL: not done Musculoskeletal: Normal range of motion, no joint effusions. No gross deformities Neurological: he is alert and oriented to person, place, and time. No cranial nerve deficit. Coordination, balance, strength, speech and gait are normal.  Skin: Skin is warm and dry. No rash noted. No erythema.  Psychiatric: Patient has a normal mood and affect. behavior is normal. Judgment and thought content normal.    PHQ2/9: Depression screen [facility] 2/9 11/30/2017 07/06/2017 11/30/2016 04/08/2016 01/09/2016  Decreased Interest 0 0 0 0 0  Down, Depressed, Hopeless 0 1 0 0 0  PHQ - 2 Score 0 1 0 0 0     Fall Risk: Fall Risk  11/30/2017 11/30/2017 08/12/2017 07/06/2017 11/30/2016  Falls in the past year? Yes No No Yes No  Number falls in past yr: 1 - - 1 -  Comment - - - at [employer] while putting up [place] -  Injury with Fall? No - - No -  Follow up Falls prevention discussed - - - -     Functional Status Survey: Is the patient deaf or have difficulty hearing?: No Does the patient have difficulty seeing, even when wearing glasses/contacts?: No Does the patient have difficulty concentrating, remembering, or making decisions?: No Does the patient have difficulty walking or climbing stairs?: No Does the patient have difficulty dressing or bathing?: No Does the patient have difficulty doing errands alone such as visiting a doctor's office or shopping?: No   Assessment & Plan  1. Well woman exam  Discussed importance of 150 minutes of physical activity weekly, eat two servings of fish weekly, eat one serving of tree nuts ( cashews, pistachios, pecans, almonds.Marland Kitchen) every other day, eat 6 servings of fruit/vegetables daily and drink plenty of water and avoid sweet beverages.  - Lipid panel - Hemoglobin A1c -  Insulin, random  2. Breast cancer screening  - MM DIGITAL SCREENING BILATERAL; Future

## 2017-12-01 LAB — INSULIN, RANDOM: Insulin: 8.4 u[IU]/mL (ref 2.0–19.6)

## 2017-12-01 LAB — HEMOGLOBIN A1C
Hgb A1c MFr Bld: 5.7 % of total Hgb — ABNORMAL HIGH (ref <5.7)
Mean Plasma Glucose: 117 (calc)
eAG (mmol/L): 6.5 (calc)

## 2018-02-09 ENCOUNTER — Encounter: Payer: Self-pay | Admitting: Family Medicine

## 2018-02-09 ENCOUNTER — Ambulatory Visit: Payer: BLUE CROSS/BLUE SHIELD | Admitting: Family Medicine

## 2018-02-09 VITALS — BP 112/62 | HR 60 | Temp 98.0°F | Resp 16 | Ht 62.0 in | Wt 172.2 lb

## 2018-02-09 DIAGNOSIS — E785 Hyperlipidemia, unspecified: Secondary | ICD-10-CM

## 2018-02-09 DIAGNOSIS — I10 Essential (primary) hypertension: Secondary | ICD-10-CM | POA: Diagnosis not present

## 2018-02-09 DIAGNOSIS — E559 Vitamin D deficiency, unspecified: Secondary | ICD-10-CM | POA: Diagnosis not present

## 2018-02-09 DIAGNOSIS — Z8673 Personal history of transient ischemic attack (TIA), and cerebral infarction without residual deficits: Secondary | ICD-10-CM | POA: Diagnosis not present

## 2018-02-09 DIAGNOSIS — G43409 Hemiplegic migraine, not intractable, without status migrainosus: Secondary | ICD-10-CM

## 2018-02-09 DIAGNOSIS — E8881 Metabolic syndrome: Secondary | ICD-10-CM

## 2018-02-09 DIAGNOSIS — K219 Gastro-esophageal reflux disease without esophagitis: Secondary | ICD-10-CM

## 2018-02-09 MED ORDER — NORTRIPTYLINE HCL 10 MG PO CAPS: 10.0000 mg | ORAL_CAPSULE | Freq: Every day | ORAL | 1 refills | Status: DC

## 2018-02-09 MED ORDER — OMEPRAZOLE 20 MG PO CPDR: 20.0000 mg | DELAYED_RELEASE_CAPSULE | Freq: Every day | ORAL | 1 refills | Status: DC

## 2018-02-09 NOTE — Progress Notes (Signed)
Name: Monique Daniels   MRN: [id number]    DOB: 02-05-1961   Date:02/09/2018       Progress Note  Subjective  Chief Complaint  Chief Complaint  Patient presents with  . Medication Refill  . Hypertension    Denies any symptoms-medication makes her sleepy-slowly stop since she is working out  . Migraine    once a month now-last one was last Sunday June 30  . Panic Attacks    Has not had and has not had to take any medication  . Cervical spinal stenosis  . Low back pain with radiculitis    Improving with exercising and losing weight  . Gastroesophageal Reflux    Experiencing esophageal spasms-with gulps of water or after working out    HPI  Low back pain with radiculitis: symptoms started over one year ago. She had MRI 2018  she has been exercising and losing weight and is doing well, denies back pain at this time  HTN: patient is off medication, states she stopped 2 weeks ago because she was getting too tired with medication, she states bp at home has been well controlled, 117's/77's . She denies chest pain or palpitation  GERD: under control with medication, no heartburn, no regurgitation since esophageal dilation 2016, down to one Prilosec daily. Under control at this time   Migraine: hemiplegic, she is no longer taking Topamax, back on Pamelor at night,  but last episode of migraine was about 1 week ago, and had slurred speech that lasted for 1 day. She states she was having too much tingling on Topamax and she is no longer taking it.. Migraine is described as a dull toothache usually on top of her head and radiates to right jaw, and gets some blurred vision, no nausea or vomiting, but at times has photophobia and phonophobia.   Metabolic Syndrome:  denies polyphagia, polydipsia or polyuria. She has been going to the gym,also running, continues to lose weight , eating healthier, last hgbA1C 5.7 %   Panic attack: seldom, last alprazolam was filled two years ago, she still has  medication at home   Dyslipidemia and history of TIA: she does not want to take statin therapy, discussed aspirin, but she takes excedrin and has GERD therefore risk of bleeding   Patient Active Problem List   Diagnosis Date Noted  . Cervical spinal stenosis 04/08/2016  . Prediabetes 01/09/2016  . Dyslipidemia 10/03/2015  . Seasonal allergic rhinitis 04/29/2015  . History of esophageal stricture 01/28/2015  . Atypical nevus 01/27/2015  . Carpal tunnel syndrome 01/27/2015  . Insomnia, persistent 01/27/2015  . DDD (degenerative disc disease), cervical 01/27/2015  . Panic attacks 01/27/2015  . Gastric reflux 01/27/2015  . Headache, hemiplegic migraine 01/27/2015  . H/O transient cerebral ischemia 01/27/2015  . Hypertension, benign 01/27/2015  . Calculus of kidney 01/27/2015  . Adult BMI 30+ 01/27/2015  . Vitamin D deficiency 01/27/2015  . Snores 01/27/2015    Past Surgical History:  Procedure Laterality Date  . HYSTEROSCOPY W/D&C N/A 02/07/2015   Procedure: DILATATION AND CURETTAGE /HYSTEROSCOPY;  Surgeon: Will Bonnet, MD;  Location: [facility] ORS;  Service: Gynecology;  Laterality: N/A;  . HYSTEROSCOPY WITH NOVASURE N/A 02/07/2015   Procedure: HYSTEROSCOPY WITH NOVASURE;  Surgeon: Will Bonnet, MD;  Location: [facility];  Service: Gynecology;  Laterality: N/A;  . TOOTH EXTRACTION    . TUBAL LIGATION  1989    Family History  Problem Relation Age of Onset  . Asthma Mother   .  Heart disease Mother   . Hyperlipidemia Mother   . Emphysema Mother   . Tremor Mother   . Macular degeneration Mother   . Asthma Son   . Heart attack Father   . Cancer Father        Unknown  . Urolithiasis Son   . Tremor Sister   . Tremor Brother   . Macular degeneration Maternal Grandmother   . Alzheimer's disease Paternal Grandfather   . Breast cancer Neg Hx     Social History   Socioeconomic History  . Marital status: Married    Spouse name: Merry Proud  . Number of children: 2  . Years of  education: 12th  . Highest education level: Not on file  Occupational History  . Occupation: Medical records    Employer: [employer]  . Financial resource strain: Not hard at all  . Food insecurity:    Worry: Never true    Inability: Never true  . Transportation needs:    Medical: No    Non-medical: No  Tobacco Use  . Smoking status: Never Smoker  . Smokeless tobacco: Never Used  Substance and Sexual Activity  . Alcohol use: Not Currently    Comment: used to drink wine very seldom   . Drug use: No  . Sexual activity: Yes    Partners: Male    Comment: BTL  Lifestyle  . Physical activity:    Days per week: 5 days    Minutes per session: 140 min  . Stress: Not at all  Relationships  . Social connections:    Talks on phone: More than three times a week    Gets together: Twice a week    Attends religious service: More than 4 times per year    Active member of club or organization: No    Attends meetings of clubs or organizations: Never    Relationship status: Married  . Intimate partner violence:    Fear of current or ex partner: No    Emotionally abused: No    Physically abused: No    Forced sexual activity: No  Other Topics Concern  . Not on file  Social History Narrative   Patient lives at home with husband Merry Proud.    Patient works at [employer].    Patient has a high school education.    Patient has 2 children.      Current Outpatient Medications:  .  ALPRAZolam (XANAX) 0.5 MG tablet, Take 1 tablet (0.5 mg total) by mouth daily as needed for anxiety (05-1 tablet)., Disp: 30 tablet, Rfl: 0 .  aspirin-acetaminophen-caffeine (EXCEDRIN MIGRAINE) 465-681-27 MG per tablet, Take 2 tablets by mouth every 6 (six) hours as needed for pain., Disp: , Rfl:  .  Cholecalciferol (VITAMIN D) 2000 units tablet, Take 2,000 Units by mouth daily., Disp: , Rfl:  .  cyclobenzaprine (FLEXERIL) 10 MG tablet, Take 0.5-1 tablets (5-10 mg total) by  mouth at bedtime as needed., Disp: 30 tablet, Rfl: 0 .  Multiple Vitamins-Minerals (CENTRUM SILVER ADULT 50+ PO), Take 1 tablet by mouth daily., Disp: , Rfl:  .  nortriptyline (PAMELOR) 10 MG capsule, Take 1 capsule (10 mg total) by mouth at bedtime., Disp: 90 capsule, Rfl: 1 .  omeprazole (PRILOSEC) 20 MG capsule, Take 1 capsule (20 mg total) by mouth daily., Disp: 90 capsule, Rfl: 1 .  sodium chloride (OCEAN) 0.65 % nasal spray, Place 1 spray into both nostrils daily as needed (for allergies). ,  Disp: , Rfl:  .  TURMERIC CURCUMIN PO, Take 1 capsule by mouth daily. 900 mg daily, Disp: , Rfl:   Allergies  Allergen Reactions  . Claritin  [Loratadine] Other (See Comments)  . Codeine Other (See Comments)    Severe headache   . Entex Lq  [Phenylephrine-Guaifenesin] Other (See Comments)  . Fexofenadine Other (See Comments)  . Loratadine-Pseudoephedrine Er Other (See Comments)  . Pseudoephedrine Other (See Comments)  . Tramadol Nausea Only     ROS  Constitutional: Negative for fever , positive for  weight change.  Respiratory: Negative for cough and shortness of breath.   Cardiovascular: Negative for chest pain or palpitations.  Gastrointestinal: Negative for abdominal pain, no bowel changes.  Musculoskeletal: Negative for gait problem or joint swelling.  Skin: Negative for rash.  Neurological: Negative for dizziness or headache.  No other specific complaints in a complete review of systems (except as listed in HPI above).   Objective  Vitals:   02/09/18 0935  BP: 112/62  Pulse: 60  Resp: 16  Temp: 98 F (36.7 C)  TempSrc: Oral  SpO2: 98%  Weight: 172 lb 3.2 oz (78.1 kg)  Height: 5\' 2"  (1.575 m)    Body mass index is 31.5 kg/m.  Physical Exam  Constitutional: Patient appears well-developed and well-nourished. Obese No distress.  HEENT: head atraumatic, normocephalic, pupils equal and reactive to light,  neck supple, throat within normal limits Cardiovascular: Normal  rate, regular rhythm and normal heart sounds.  No murmur heard. No BLE edema. Pulmonary/Chest: Effort normal and breath sounds normal. No respiratory distress. Abdominal: Soft.  There is no tenderness. Psychiatric: Patient has a normal mood and affect. behavior is normal. Judgment and thought content normal. Muscular Skeletal: normal exam today   Recent Results (from the past 2160 hour(s))  Lipid panel     Status: Abnormal   Collection Time: 11/30/17  9:15 AM  Result Value Ref Range   Cholesterol 192 <200 mg/dL   HDL 42 (L) >50 mg/dL   Triglycerides 94 <150 mg/dL   LDL Cholesterol (Calc) 130 (H) mg/dL (calc)    Comment: Reference range: <100 . Desirable range <100 mg/dL for primary prevention;   <70 mg/dL for patients with CHD or diabetic patients  with > or = 2 CHD risk factors. Marland Kitchen LDL-C is now calculated using the Martin-Hopkins  calculation, which is a validated novel method providing  better accuracy than the Friedewald equation in the  estimation of LDL-C.  Cresenciano Genre et al. Annamaria Helling. 9381;829(93): 2061-2068  (http://education.[facility].com/faq/FAQ164)    Total CHOL/HDL Ratio 4.6 <5.0 (calc)   Non-HDL Cholesterol (Calc) 150 (H) <130 mg/dL (calc)    Comment: For patients with diabetes plus 1 major ASCVD risk  factor, treating to a non-HDL-C goal of <100 mg/dL  (LDL-C of <70 mg/dL) is considered a therapeutic  option.   Hemoglobin A1c     Status: Abnormal   Collection Time: 11/30/17  9:15 AM  Result Value Ref Range   Hgb A1c MFr Bld 5.7 (H) <5.7 % of total Hgb    Comment: For someone without known diabetes, a hemoglobin  A1c value between 5.7% and 6.4% is consistent with prediabetes and should be confirmed with a  follow-up test. . For someone with known diabetes, a value <7% indicates that their diabetes is well controlled. A1c targets should be individualized based on duration of diabetes, age, comorbid conditions, and other considerations. . This assay result  is consistent with an increased risk of diabetes. Marland Kitchen  Currently, no consensus exists regarding use of hemoglobin A1c for diagnosis of diabetes for children. .    Mean Plasma Glucose 117 (calc)   eAG (mmol/L) 6.5 (calc)  Insulin, random     Status: None   Collection Time: 11/30/17  9:15 AM  Result Value Ref Range   Insulin 8.4 2.0 - 19.6 uIU/mL    Comment: This insulin assay shows strong cross-reactivity for some insulin analogs (lispro, aspart, and glargine) and much lower cross-reactivity with others (detemir, glulisine).       PHQ2/9: Depression screen [facility] 2/9 02/09/2018 11/30/2017 07/06/2017 11/30/2016 04/08/2016  Decreased Interest 0 0 0 0 0  Down, Depressed, Hopeless 0 0 1 0 0  PHQ - 2 Score 0 0 1 0 0    Fall Risk: Fall Risk  02/09/2018 11/30/2017 11/30/2017 08/12/2017 07/06/2017  Falls in the past year? No Yes No No Yes  Number falls in past yr: - 1 - - 1  Comment - - - - at church while putting up [place]  Injury with Fall? - No - - No  Follow up - Falls prevention discussed - - -    Functional Status Survey: Is the patient deaf or have difficulty hearing?: No Does the patient have difficulty seeing, even when wearing glasses/contacts?: Yes(glasses) Does the patient have difficulty concentrating, remembering, or making decisions?: No Does the patient have difficulty walking or climbing stairs?: No Does the patient have difficulty dressing or bathing?: No Does the patient have difficulty doing errands alone such as visiting a doctor's office or shopping?: No    Assessment & Plan  1. Hemiplegic migraine without status migrainosus, not intractable  - nortriptyline (PAMELOR) 10 MG capsule; Take 1 capsule (10 mg total) by mouth at bedtime.  Dispense: 90 capsule; Refill: 1  2. Gastric reflux  - omeprazole (PRILOSEC) 20 MG capsule; Take 1 capsule (20 mg total) by mouth daily.  Dispense: 90 capsule; Refill: 1  3. Hypertension, benign  She is off medication since she has  changed diet and lost weight   4. Metabolic syndrome  On life style modification   5. Vitamin D deficiency   6. Dyslipidemia  On diet only

## 2018-02-17 ENCOUNTER — Other Ambulatory Visit: Payer: Self-pay | Admitting: Family Medicine

## 2018-02-17 DIAGNOSIS — I10 Essential (primary) hypertension: Secondary | ICD-10-CM

## 2018-02-17 NOTE — Telephone Encounter (Signed)
Refill request was sent to Dr. Krichna Sowles for approval and submission.  

## 2018-08-15 ENCOUNTER — Ambulatory Visit: Payer: BLUE CROSS/BLUE SHIELD | Admitting: Family Medicine

## 2018-08-30 ENCOUNTER — Other Ambulatory Visit: Payer: Self-pay | Admitting: Family Medicine

## 2018-08-30 DIAGNOSIS — G43409 Hemiplegic migraine, not intractable, without status migrainosus: Secondary | ICD-10-CM

## 2018-08-30 NOTE — Telephone Encounter (Signed)
Pt informed script has been called into pharmacy

## 2018-08-30 NOTE — Telephone Encounter (Signed)
LVM for pt to call the office to schedule an appt. °

## 2018-08-30 NOTE — Telephone Encounter (Signed)
Pt called and scheduled medication refill OV for 10/10/2018.

## 2018-08-30 NOTE — Telephone Encounter (Signed)
She needs follow up, I will send enough until follow up

## 2018-08-30 NOTE — Telephone Encounter (Signed)
Refill request for general medication. Nortriptyline to [facility]  Last office visit 02/09/2018   No follow-ups on file.

## 2018-09-30 ENCOUNTER — Other Ambulatory Visit: Payer: Self-pay | Admitting: Family Medicine

## 2018-09-30 DIAGNOSIS — G43409 Hemiplegic migraine, not intractable, without status migrainosus: Secondary | ICD-10-CM

## 2018-10-10 ENCOUNTER — Other Ambulatory Visit: Payer: Self-pay

## 2018-10-10 ENCOUNTER — Encounter: Payer: Self-pay | Admitting: Family Medicine

## 2018-10-10 ENCOUNTER — Ambulatory Visit (INDEPENDENT_AMBULATORY_CARE_PROVIDER_SITE_OTHER): Payer: BLUE CROSS/BLUE SHIELD | Admitting: Family Medicine

## 2018-10-10 VITALS — BP 140/74 | HR 50 | Temp 97.9°F | Resp 16 | Ht 62.0 in | Wt 160.8 lb

## 2018-10-10 DIAGNOSIS — E8881 Metabolic syndrome: Secondary | ICD-10-CM

## 2018-10-10 DIAGNOSIS — Z1239 Encounter for other screening for malignant neoplasm of breast: Secondary | ICD-10-CM

## 2018-10-10 DIAGNOSIS — G43409 Hemiplegic migraine, not intractable, without status migrainosus: Secondary | ICD-10-CM

## 2018-10-10 DIAGNOSIS — K219 Gastro-esophageal reflux disease without esophagitis: Secondary | ICD-10-CM | POA: Diagnosis not present

## 2018-10-10 DIAGNOSIS — J069 Acute upper respiratory infection, unspecified: Secondary | ICD-10-CM

## 2018-10-10 DIAGNOSIS — M5416 Radiculopathy, lumbar region: Secondary | ICD-10-CM | POA: Diagnosis not present

## 2018-10-10 DIAGNOSIS — F410 Panic disorder [episodic paroxysmal anxiety] without agoraphobia: Secondary | ICD-10-CM

## 2018-10-10 DIAGNOSIS — I10 Essential (primary) hypertension: Secondary | ICD-10-CM

## 2018-10-10 MED ORDER — NORTRIPTYLINE HCL 10 MG PO CAPS: 10.0000 mg | ORAL_CAPSULE | Freq: Every day | ORAL | 1 refills | Status: DC

## 2018-10-10 MED ORDER — ALPRAZOLAM 0.5 MG PO TABS: 0.5000 mg | ORAL_TABLET | Freq: Every day | ORAL | 0 refills | Status: DC | PRN

## 2018-10-10 MED ORDER — OMEPRAZOLE 20 MG PO CPDR: 20.0000 mg | DELAYED_RELEASE_CAPSULE | Freq: Every day | ORAL | 1 refills | Status: DC

## 2018-10-10 NOTE — Progress Notes (Signed)
Name: Monique Daniels   MRN: [id number]    DOB: 09/24/1960   Date:10/10/2018       Progress Note  Subjective  Chief Complaint  Chief Complaint  Patient presents with  . Medication Refill  . Hypertension  . Insomnia    HPI  Low back pain with radiculitis: symptoms started over one year ago. She had MRI 2018  she has been exercising and losing weight and is doing well, denies back pain at this time. She no longer takes flexeril   HTN: patient is off medication, she stopped taking it over 6 months ago, bp at home has been controlled in the 120's/80's. HR in the 50's, reviewed EKG done in 2018 and HR was 58. She denies chest pain or palpitation ( unless she has a panic attack)   GERD: under control with medication, no heartburn, no regurgitation since esophageal dilation 2016, down to one Prilosec daily. Under control at this time   Migraine: hemiplegic, she is no longer taking Topamax because of tingling sensation,  back on Pamelor at night, but last episode of migraine was months ago ( none in 2020). Migraine is described as a dull like and sometimes a jaw pain like a toothache, associated with blurred vision, no nausea or vomiting, but at times has photophobia and phonophobia.  Metabolic Syndrome:  denies polyphagia, polydipsia or polyuria.She has been going to the gym, doing a boot camp three days a week,  continues to lose weight, 12 lbs, eating healthier, last hgbA1C 5.7 %   Panic attack: s she states her brother was causing a lot of stress and would like a refill today, last rx was 08/2017  Dyslipidemia and history of TIA: she does not want to take statin therapy, discussed aspirin, but she takes excedrin and has GERD therefore risk of bleeding Recheck yearly   Patient Active Problem List   Diagnosis Date Noted  . Cervical spinal stenosis 04/08/2016  . Prediabetes 01/09/2016  . Dyslipidemia 10/03/2015  . Seasonal allergic rhinitis 04/29/2015  . History of esophageal  stricture 01/28/2015  . Atypical nevus 01/27/2015  . Carpal tunnel syndrome 01/27/2015  . Insomnia, persistent 01/27/2015  . DDD (degenerative disc disease), cervical 01/27/2015  . Panic attacks 01/27/2015  . Gastric reflux 01/27/2015  . Headache, hemiplegic migraine 01/27/2015  . H/O transient cerebral ischemia 01/27/2015  . Hypertension, benign 01/27/2015  . Calculus of kidney 01/27/2015  . Adult BMI 30+ 01/27/2015  . Vitamin D deficiency 01/27/2015  . Snores 01/27/2015    Past Surgical History:  Procedure Laterality Date  . HYSTEROSCOPY W/D&C N/A 02/07/2015   Procedure: DILATATION AND CURETTAGE /HYSTEROSCOPY;  Surgeon: Will Bonnet, MD;  Location: [facility];  Service: Gynecology;  Laterality: N/A;  . HYSTEROSCOPY WITH NOVASURE N/A 02/07/2015   Procedure: HYSTEROSCOPY WITH NOVASURE;  Surgeon: Will Bonnet, MD;  Location: [facility];  Service: Gynecology;  Laterality: N/A;  . TOOTH EXTRACTION    . TUBAL LIGATION  1989    Family History  Problem Relation Age of Onset  . Asthma Mother   . Heart disease Mother   . Hyperlipidemia Mother   . Emphysema Mother   . Tremor Mother   . Macular degeneration Mother   . Asthma Son   . Heart attack Father   . Cancer Father        Unknown  . Urolithiasis Son   . Tremor Sister   . Tremor Brother   . Macular degeneration Maternal Grandmother   . Alzheimer's  disease Paternal Grandfather   . Breast cancer Neg Hx     Social History   Socioeconomic History  . Marital status: Married    Spouse name: Merry Proud  . Number of children: 2  . Years of education: 12th  . Highest education level: Not on file  Occupational History  . Occupation: Medical records    Employer: [employer]  . Financial resource strain: Not hard at all  . Food insecurity:    Worry: Never true    Inability: Never true  . Transportation needs:    Medical: No    Non-medical: No  Tobacco Use  . Smoking status: Never Smoker  .  Smokeless tobacco: Never Used  Substance and Sexual Activity  . Alcohol use: Not Currently    Comment: used to drink wine very seldom   . Drug use: No  . Sexual activity: Yes    Partners: Male    Comment: BTL  Lifestyle  . Physical activity:    Days per week: 5 days    Minutes per session: 140 min  . Stress: Not at all  Relationships  . Social connections:    Talks on phone: More than three times a week    Gets together: Twice a week    Attends religious service: More than 4 times per year    Active member of club or organization: No    Attends meetings of clubs or organizations: Never    Relationship status: Married  . Intimate partner violence:    Fear of current or ex partner: No    Emotionally abused: No    Physically abused: No    Forced sexual activity: No  Other Topics Concern  . Not on file  Social History Narrative   Patient lives at home with husband Merry Proud.    Patient works at [employer].    Patient has a high school education.    Patient has 2 children.      Current Outpatient Medications:  .  ALPRAZolam (XANAX) 0.5 MG tablet, Take 1 tablet (0.5 mg total) by mouth daily as needed for anxiety (05-1 tablet)., Disp: 30 tablet, Rfl: 0 .  aspirin-acetaminophen-caffeine (EXCEDRIN MIGRAINE) 177-939-03 MG per tablet, Take 2 tablets by mouth every 6 (six) hours as needed for pain., Disp: , Rfl:  .  Cholecalciferol (VITAMIN D) 2000 units tablet, Take 2,000 Units by mouth daily., Disp: , Rfl:  .  cyclobenzaprine (FLEXERIL) 10 MG tablet, Take 0.5-1 tablets (5-10 mg total) by mouth at bedtime as needed., Disp: 30 tablet, Rfl: 0 .  Multiple Vitamins-Minerals (CENTRUM SILVER ADULT 50+ PO), Take 1 tablet by mouth daily., Disp: , Rfl:  .  nortriptyline (PAMELOR) 10 MG capsule, TAKE 1 CAPSULE (10 MG TOTAL) BY MOUTH AT BEDTIME., Disp: 90 capsule, Rfl: 0 .  omeprazole (PRILOSEC) 20 MG capsule, Take 1 capsule (20 mg total) by mouth daily., Disp: 90 capsule, Rfl: 1 .  sodium  chloride (OCEAN) 0.65 % nasal spray, Place 1 spray into both nostrils daily as needed (for allergies). , Disp: , Rfl:  .  TURMERIC CURCUMIN PO, Take 1 capsule by mouth daily. 900 mg daily, Disp: , Rfl:   Allergies  Allergen Reactions  . Claritin  [Loratadine] Other (See Comments)  . Codeine Other (See Comments)    Severe headache   . Entex Lq  [Phenylephrine-Guaifenesin] Other (See Comments)  . Fexofenadine Other (See Comments)  . Loratadine-Pseudoephedrine Er Other (See Comments)  . Pseudoephedrine Other (  See Comments)  . Tramadol Nausea Only    I personally reviewed active problem list, medication list, allergies, family history, social history with the patient/caregiver today.   ROS  Constitutional: Negative for fever , positive for  weight change.  Respiratory: Negative for cough and shortness of breath.   Cardiovascular: Negative for chest pain or palpitations.  Gastrointestinal: Negative for abdominal pain, no bowel changes.  Musculoskeletal: Negative for gait problem or joint swelling.  Skin: Negative for rash.  Neurological: Negative for dizziness , no recent episodes of  headache.  No other specific complaints in a complete review of systems (except as listed in HPI above).  Objective  Vitals:   10/10/18 0821 10/10/18 0825  BP: (!) 150/80 140/74  Pulse: (!) 50   Resp: 16   Temp: 97.9 F (36.6 C)   TempSrc: Oral   SpO2: 98%   Weight: 160 lb 12.8 oz (72.9 kg)   Height: 5\' 2"  (1.575 m)     Body mass index is 29.41 kg/m.  Physical Exam  Constitutional: Patient appears well-developed and well-nourished. Overweight.  No distress.  HEENT: head atraumatic, normocephalic, pupils equal and reactive to light, neck supple, throat within normal limits Cardiovascular: Normal rate, regular rhythm and normal heart sounds.  No murmur heard. No BLE edema. Pulmonary/Chest: Effort normal and breath sounds normal. No respiratory distress. Abdominal: Soft.  There is no  tenderness. Psychiatric: Patient has a normal mood and affect. behavior is normal. Judgment and thought content normal.  PHQ2/9: Depression screen [facility] 2/9 10/10/2018 02/09/2018 11/30/2017 07/06/2017 11/30/2016  Decreased Interest 0 0 0 0 0  Down, Depressed, Hopeless 0 0 0 1 0  PHQ - 2 Score 0 0 0 1 0  Altered sleeping 0 - - - -  Tired, decreased energy 0 - - - -  Change in appetite 0 - - - -  Feeling bad or failure about yourself  0 - - - -  Trouble concentrating 0 - - - -  Moving slowly or fidgety/restless 0 - - - -  Suicidal thoughts 0 - - - -  PHQ-9 Score 0 - - - -    Fall Risk: Fall Risk  10/10/2018 02/09/2018 11/30/2017 11/30/2017 08/12/2017  Falls in the past year? 0 No Yes No No  Number falls in past yr: 0 - 1 - -  Comment - - - - -  Injury with Fall? 0 - No - -  Follow up - - Falls prevention discussed - -     Assessment & Plan  1. Panic attacks  - ALPRAZolam (XANAX) 0.5 MG tablet; Take 1 tablet (0.5 mg total) by mouth daily as needed for anxiety (05-1 tablet).  Dispense: 30 tablet; Refill: 0  2. Right lumbar radiculitis  Doing well at this time  3. Gastric reflux  - omeprazole (PRILOSEC) 20 MG capsule; Take 1 capsule (20 mg total) by mouth daily.  Dispense: 90 capsule; Refill: 1  4. Hemiplegic migraine without status migrainosus, not intractable  - nortriptyline (PAMELOR) 10 MG capsule; Take 1 capsule (10 mg total) by mouth at bedtime.  Dispense: 90 capsule; Refill: 1  5. Breast cancer screening  She needs to go back, gave her a card  6. Viral upper respiratory tract infection  Discussed tessalon perles but she will try otc medication first   7. Hypertension, benign  At home is at goal, it was high when she first came in but improved before she left   8. Metabolic syndrome  Glucose  at home has been in the 80's

## 2018-12-28 DIAGNOSIS — Z8719 Personal history of other diseases of the digestive system: Secondary | ICD-10-CM | POA: Diagnosis not present

## 2018-12-28 DIAGNOSIS — R197 Diarrhea, unspecified: Secondary | ICD-10-CM | POA: Diagnosis not present

## 2018-12-28 DIAGNOSIS — R109 Unspecified abdominal pain: Secondary | ICD-10-CM | POA: Diagnosis not present

## 2019-04-11 ENCOUNTER — Other Ambulatory Visit: Payer: Self-pay | Admitting: Family Medicine

## 2019-04-11 DIAGNOSIS — K219 Gastro-esophageal reflux disease without esophagitis: Secondary | ICD-10-CM

## 2019-04-13 ENCOUNTER — Other Ambulatory Visit: Payer: Self-pay

## 2019-04-13 ENCOUNTER — Encounter: Payer: Self-pay | Admitting: Family Medicine

## 2019-04-13 ENCOUNTER — Ambulatory Visit (INDEPENDENT_AMBULATORY_CARE_PROVIDER_SITE_OTHER): Payer: BC Managed Care – PPO | Admitting: Family Medicine

## 2019-04-13 VITALS — BP 129/67 | Temp 98.4°F | Wt 159.0 lb

## 2019-04-13 DIAGNOSIS — E8881 Metabolic syndrome: Secondary | ICD-10-CM

## 2019-04-13 DIAGNOSIS — K219 Gastro-esophageal reflux disease without esophagitis: Secondary | ICD-10-CM | POA: Diagnosis not present

## 2019-04-13 DIAGNOSIS — F410 Panic disorder [episodic paroxysmal anxiety] without agoraphobia: Secondary | ICD-10-CM

## 2019-04-13 DIAGNOSIS — E559 Vitamin D deficiency, unspecified: Secondary | ICD-10-CM | POA: Diagnosis not present

## 2019-04-13 DIAGNOSIS — E785 Hyperlipidemia, unspecified: Secondary | ICD-10-CM

## 2019-04-13 DIAGNOSIS — G43409 Hemiplegic migraine, not intractable, without status migrainosus: Secondary | ICD-10-CM | POA: Diagnosis not present

## 2019-04-13 DIAGNOSIS — Z8673 Personal history of transient ischemic attack (TIA), and cerebral infarction without residual deficits: Secondary | ICD-10-CM

## 2019-04-13 DIAGNOSIS — I10 Essential (primary) hypertension: Secondary | ICD-10-CM

## 2019-04-13 DIAGNOSIS — Z1239 Encounter for other screening for malignant neoplasm of breast: Secondary | ICD-10-CM

## 2019-04-13 NOTE — Progress Notes (Signed)
Name: Monique Daniels   MRN: [id number]    DOB: Jul 25, 1961   Date:04/13/2019       Progress Note  Subjective  Chief Complaint  Chief Complaint  Patient presents with   Hypertension   Migraine   Panic Attacks    I connected with  Virgilio Belling  on 04/13/19 at  8:00 AM EDT by a video enabled telemedicine application and verified that I am speaking with the correct person using two identifiers.  I discussed the limitations of evaluation and management by telemedicine and the availability of in person appointments. The patient expressed understanding and agreed to proceed. Staff also discussed with the patient that there may be a patient responsible charge related to this service. Patient Location: at work  Provider Location: [facility]   HPI  Low back pain with radiculitis: symptoms started over one year ago. She had MRI2018she has been exercising and losing weight and is doing well, denies back pain at this time. She no longer takes flexeril. She feels stronger and healthy   HTN: patient isoff medication, she stopped taking it over 12  months ago, bp at home has been controlled in the 120's/80's. HR in the 50's. She denies chest pain or palpitation. She is eating healthier, avoids fast food   GERD: under control with medication, no heartburn, no regurgitation since esophageal dilation 2016, down to one Prilosec daily. This is the only rx she was unable to stop since she started exercising but states with dietary changes her symptoms are well controlled   Migraine: hemiplegic, she is no longer taking Topamax because of tingling sensation,  she was on Pamelor but stopped taking it months ago and no symptoms since. She states exercise and healthy diet has really helped with her symptoms . Migraine episodes were  a dull like and sometimes a jaw pain like a toothache, associated with blurred vision, no nausea or vomiting, but at times has photophobia and  phonophobia.  Metabolic Syndrome: denies polyphagia, polydipsia or polyuria.She has been going to the gym, no longer obese, eating healthier, last hgbA1C 5.7 % she would like to have labs repeated to see if HDL is higher and if glucose is down, states her glucose dropped while at work and she was shaking, it was after intense cardio the night before   Panic attack: she states she has been feeling great and not panic attack in months, still has alprazolam at home   Dyslipidemia and history of TIA: she does not want to take statin therapy, discussed aspirin, but she takes excedrin and has GERD therefore risk of bleeding. She will return for labs  Galactorrhea: resolved, advised to have mammogram   Patient Active Problem List   Diagnosis Date Noted   Metabolic syndrome 123456   Cervical spinal stenosis 04/08/2016   Prediabetes 01/09/2016   Dyslipidemia 10/03/2015   Seasonal allergic rhinitis 04/29/2015   History of esophageal stricture 01/28/2015   Atypical nevus 01/27/2015   Carpal tunnel syndrome 01/27/2015   Insomnia, persistent 01/27/2015   DDD (degenerative disc disease), cervical 01/27/2015   Panic attacks 01/27/2015   Gastric reflux 01/27/2015   Headache, hemiplegic migraine 01/27/2015   H/O transient cerebral ischemia 01/27/2015   Hypertension, benign 01/27/2015   Calculus of kidney 01/27/2015   Adult BMI 30+ 01/27/2015   Vitamin D deficiency 01/27/2015   Snores 01/27/2015    Past Surgical History:  Procedure Laterality Date   HYSTEROSCOPY W/D&C N/A 02/07/2015   Procedure: DILATATION AND  CURETTAGE /HYSTEROSCOPY;  Surgeon: Will Bonnet, MD;  Location: [facility] ORS;  Service: Gynecology;  Laterality: N/A;   HYSTEROSCOPY WITH NOVASURE N/A 02/07/2015   Procedure: HYSTEROSCOPY WITH NOVASURE;  Surgeon: Will Bonnet, MD;  Location: [facility] OR[facility];  Service: Gynecology;  Laterality: N/A;   TOOTH EXTRACTION     TUBAL LIGATION  1989    Family  History  Problem Relation Age of Onset   Asthma Mother    Heart disease Mother    Hyperlipidemia Mother    Emphysema Mother    Tremor Mother    Macular degeneration Mother    Asthma Son    Heart attack Father    Cancer Father        Unknown   Urolithiasis Son    Tremor Sister    Tremor Brother    Macular degeneration Maternal Grandmother    Alzheimer's disease Paternal Grandfather    Breast cancer Neg Hx     Social History   Socioeconomic History   Marital status: Married    Spouse name: Merry Proud   Number of children: 2   Years of education: 12th   Highest education level: Not on file  Occupational History   Occupation: Medical records    Employer: [employer] resource strain: Not hard at all   Food insecurity    Worry: Never true    Inability: Never true   Transportation needs    Medical: No    Non-medical: No  Tobacco Use   Smoking status: Never Smoker   Smokeless tobacco: Never Used  Substance and Sexual Activity   Alcohol use: Not Currently    Comment: used to drink wine very seldom    Drug use: No   Sexual activity: Yes    Partners: Male    Comment: BTL  Lifestyle   Physical activity    Days per week: 5 days    Minutes per session: 140 min   Stress: Not at all  Relationships   Social connections    Talks on phone: More than three times a week    Gets together: Twice a week    Attends religious service: More than 4 times per year    Active member of club or organization: No    Attends meetings of clubs or organizations: Never    Relationship status: Married   Intimate partner violence    Fear of current or ex partner: No    Emotionally abused: No    Physically abused: No    Forced sexual activity: No  Other Topics Concern   Not on file  Social History Narrative   Patient lives at home with husband Merry Proud.    Patient works at [employer].    Patient has a high school  education.    Patient has 2 children.      Current Outpatient Medications:    ALPRAZolam (XANAX) 0.5 MG tablet, Take 1 tablet (0.5 mg total) by mouth daily as needed for anxiety (05-1 tablet)., Disp: 30 tablet, Rfl: 0   aspirin-acetaminophen-caffeine (EXCEDRIN MIGRAINE) [id number] MG per tablet, Take 2 tablets by mouth every 6 (six) hours as needed for pain., Disp: , Rfl:    Cholecalciferol (VITAMIN D) 2000 units tablet, Take 2,000 Units by mouth daily., Disp: , Rfl:    Multiple Vitamins-Minerals (CENTRUM SILVER ADULT 50+ PO), Take 1 tablet by mouth daily., Disp: , Rfl:    nortriptyline (PAMELOR) 10 MG capsule, Take 1 capsule (10  mg total) by mouth at bedtime., Disp: 90 capsule, Rfl: 1   omeprazole (PRILOSEC) 20 MG capsule, TAKE 1 CAPSULE BY MOUTH EVERY DAY, Disp: 90 capsule, Rfl: 1   sodium chloride (OCEAN) 0.65 % nasal spray, Place 1 spray into both nostrils daily as needed (for allergies). , Disp: , Rfl:    TURMERIC CURCUMIN PO, Take 1 capsule by mouth daily. 900 mg daily, Disp: , Rfl:   Allergies  Allergen Reactions   Claritin  [Loratadine] Other (See Comments)   Codeine Other (See Comments)    Severe headache    Entex Lq  [Phenylephrine-Guaifenesin] Other (See Comments)   Fexofenadine Other (See Comments)   Loratadine-Pseudoephedrine Er Other (See Comments)   Pseudoephedrine Other (See Comments)   Tramadol Nausea Only    I personally reviewed active problem list, medication list, allergies, family history, social history, health maintenance with the patient/caregiver today.   ROS  Ten systems reviewed and is negative except as mentioned in HPI   Objective  Virtual encounter, vitals obtained at home  Today's Vitals   04/13/19 0803  BP: 129/67  Temp: 98.4 F (36.9 C)  TempSrc: Temporal  Weight: 159 lb (72.1 kg)   Body mass index is 29.08 kg/m.Marland Kitchen   Physical Exam  Awake, alert and oriented   PHQ2/9: Depression screen [facility] 2/9 04/13/2019 10/10/2018  02/09/2018 11/30/2017 07/06/2017  Decreased Interest 0 0 0 0 0  Down, Depressed, Hopeless 0 0 0 0 1  PHQ - 2 Score 0 0 0 0 1  Altered sleeping 0 0 - - -  Tired, decreased energy 0 0 - - -  Change in appetite 0 0 - - -  Feeling bad or failure about yourself  0 0 - - -  Trouble concentrating 0 0 - - -  Moving slowly or fidgety/restless 0 0 - - -  Suicidal thoughts 0 0 - - -  PHQ-9 Score 0 0 - - -   PHQ-2/9 Result is negative.    Fall Risk: Fall Risk  04/13/2019 10/10/2018 02/09/2018 11/30/2017 11/30/2017  Falls in the past year? 0 0 No Yes No  Number falls in past yr: 0 0 - 1 -  Comment - - - - -  Injury with Fall? 0 0 - No -  Follow up - - - Falls prevention discussed -     Assessment & Plan  1. Hemiplegic migraine without status migrainosus, not intractable  She is off Nortriptyline and is doing well  2. Metabolic syndrome  - Hemoglobin A1c  3. Gastric reflux  She is taking Omeprazole daily and is doing well   4. Vitamin D deficiency  - VITAMIN D 25 Hydroxy (Vit-D Deficiency, Fractures)  5. Dyslipidemia  - Lipid panel  6. Hypertension, benign  - COMPLETE METABOLIC PANEL WITH GFR - CBC with Differential/Platelet  7. History of TIA (transient ischemic attack)   8. Breast cancer screening  - MM 3D SCREEN BREAST BILATERAL; Future  9. Panic attacks  resolved  I discussed the assessment and treatment plan with the patient. The patient was provided an opportunity to ask questions and all were answered. The patient agreed with the plan and demonstrated an understanding of the instructions.  The patient was advised to call back or seek an in-person evaluation if the symptoms worsen or if the condition fails to improve as anticipated.  I provided 25  minutes of non-face-to-face time during this encounter.

## 2019-04-17 DIAGNOSIS — I10 Essential (primary) hypertension: Secondary | ICD-10-CM | POA: Diagnosis not present

## 2019-04-17 DIAGNOSIS — E785 Hyperlipidemia, unspecified: Secondary | ICD-10-CM | POA: Diagnosis not present

## 2019-04-17 DIAGNOSIS — E8881 Metabolic syndrome: Secondary | ICD-10-CM | POA: Diagnosis not present

## 2019-04-17 DIAGNOSIS — E559 Vitamin D deficiency, unspecified: Secondary | ICD-10-CM | POA: Diagnosis not present

## 2019-04-18 LAB — COMPLETE METABOLIC PANEL WITH GFR
AG Ratio: 1.6 (calc) (ref 1.0–2.5)
ALT: 13 U/L (ref 6–29)
AST: 17 U/L (ref 10–35)
Albumin: 3.8 g/dL (ref 3.6–5.1)
Alkaline phosphatase (APISO): 58 U/L (ref 37–153)
BUN: 16 mg/dL (ref 7–25)
CO2: 29 mmol/L (ref 20–32)
Calcium: 9.3 mg/dL (ref 8.6–10.4)
Chloride: 108 mmol/L (ref 98–110)
Creat: 0.79 mg/dL (ref 0.50–1.05)
GFR, Est African American: 97 mL/min/{1.73_m2} (ref >=60)
GFR, Est Non African American: 84 mL/min/{1.73_m2} (ref >=60)
Globulin: 2.4 g/dL (calc) (ref 1.9–3.7)
Glucose, Bld: 87 mg/dL (ref 65–99)
Potassium: 4.5 mmol/L (ref 3.5–5.3)
Sodium: 142 mmol/L (ref 135–146)
Total Bilirubin: 0.6 mg/dL (ref 0.2–1.2)
Total Protein: 6.2 g/dL (ref 6.1–8.1)

## 2019-04-18 LAB — VITAMIN D 25 HYDROXY (VIT D DEFICIENCY, FRACTURES): Vit D, 25-Hydroxy: 30 ng/mL (ref 30–100)

## 2019-04-18 LAB — HEMOGLOBIN A1C
Hgb A1c MFr Bld: 5.3 % of total Hgb (ref <5.7)
Mean Plasma Glucose: 105 (calc)
eAG (mmol/L): 5.8 (calc)

## 2019-04-18 LAB — CBC WITH DIFFERENTIAL/PLATELET
Absolute Monocytes: 464 cells/uL (ref 200–950)
Basophils Absolute: 82 cells/uL (ref 0–200)
Basophils Relative: 1.6 %
Eosinophils Absolute: 434 cells/uL (ref 15–500)
Eosinophils Relative: 8.5 %
HCT: 41 % (ref 35.0–45.0)
Hemoglobin: 13.3 g/dL (ref 11.7–15.5)
Lymphs Abs: 1678 cells/uL (ref 850–3900)
MCH: 29.4 pg (ref 27.0–33.0)
MCHC: 32.4 g/dL (ref 32.0–36.0)
MCV: 90.7 fL (ref 80.0–100.0)
MPV: 11 fL (ref 7.5–12.5)
Monocytes Relative: 9.1 %
Neutro Abs: 2443 cells/uL (ref 1500–7800)
Neutrophils Relative %: 47.9 %
Platelets: 240 10*3/uL (ref 140–400)
RBC: 4.52 10*6/uL (ref 3.80–5.10)
RDW: 12.9 % (ref 11.0–15.0)
Total Lymphocyte: 32.9 %
WBC: 5.1 10*3/uL (ref 3.8–10.8)

## 2019-04-18 LAB — LIPID PANEL
Cholesterol: 178 mg/dL (ref <200)
HDL: 59 mg/dL (ref >=50)
LDL Cholesterol (Calc): 105 mg/dL (calc) — ABNORMAL HIGH
Non-HDL Cholesterol (Calc): 119 mg/dL (calc) (ref <130)
Total CHOL/HDL Ratio: 3 (calc) (ref <5.0)
Triglycerides: 57 mg/dL (ref <150)

## 2019-08-24 ENCOUNTER — Other Ambulatory Visit: Payer: Self-pay

## 2019-08-24 ENCOUNTER — Encounter: Payer: Self-pay | Admitting: Family Medicine

## 2019-08-24 ENCOUNTER — Ambulatory Visit (INDEPENDENT_AMBULATORY_CARE_PROVIDER_SITE_OTHER): Payer: BC Managed Care – PPO | Admitting: Family Medicine

## 2019-08-24 VITALS — Temp 98.7°F | Ht 62.0 in | Wt 170.2 lb

## 2019-08-24 DIAGNOSIS — U071 COVID-19: Secondary | ICD-10-CM | POA: Diagnosis not present

## 2019-08-24 DIAGNOSIS — R110 Nausea: Secondary | ICD-10-CM

## 2019-08-24 DIAGNOSIS — R059 Cough, unspecified: Secondary | ICD-10-CM

## 2019-08-24 DIAGNOSIS — R05 Cough: Secondary | ICD-10-CM

## 2019-08-24 MED ORDER — ANORO ELLIPTA 62.5-25 MCG/INH IN AEPB: 1.0000 | INHALATION_SPRAY | Freq: Every day | RESPIRATORY_TRACT | 0 refills | Status: DC

## 2019-08-24 MED ORDER — BENZONATATE 100 MG PO CAPS: 100.0000 mg | ORAL_CAPSULE | Freq: Two times a day (BID) | ORAL | 0 refills | Status: DC | PRN

## 2019-08-24 MED ORDER — HYDROCOD POLST-CPM POLST ER 10-8 MG/5ML PO SUER: 5.0000 mL | Freq: Every evening | ORAL | 0 refills | Status: DC | PRN

## 2019-08-24 MED ORDER — ONDANSETRON HCL 4 MG PO TABS: 4.0000 mg | ORAL_TABLET | Freq: Three times a day (TID) | ORAL | 0 refills | Status: DC | PRN

## 2019-08-24 NOTE — Progress Notes (Signed)
Name: Monique Daniels   MRN: [id number]    DOB: Nov 29, 1960   Date:08/24/2019       Progress Note  Subjective  Chief Complaint  Chief Complaint  Patient presents with  . Chest Pain    Chest Tightness  . Cough    Dry Cough  . Shortness of Breath    short winded when she talks alot  . COVID-19    Tested on the 11th of January    I connected with  Virgilio Belling on 08/24/19 at 11:20 AM EST by telephone and verified that I am speaking with the correct person using two identifiers.  I discussed the limitations, risks, security and privacy concerns of performing an evaluation and management service by telephone and the availability of in person appointments. Staff also discussed with the patient that there may be a patient responsible charge related to this service. Patient Location: at home  Provider Location: [facility]   HPI  COVID-19 : she developed diarrhea on January 6 th and mild headache, followed by feeling feverish. She gets regular tests at work and was negative two days earlier and test was repeated because of increase in headaches, abdominal pain, low grade fever followed by body aches and chills. She states lost ability to taste and smell 4 days after onset of symptoms . She states dry cough started early in the course of illness but much worse over the past few days, having some chest tightness and sob . She states her supervisor advised her to go back to work on day 9 of disease onset ( she works at [employer])   Patient Active Problem List   Diagnosis Date Noted  . Metabolic syndrome 123456  . Cervical spinal stenosis 04/08/2016  . Prediabetes 01/09/2016  . Dyslipidemia 10/03/2015  . Seasonal allergic rhinitis 04/29/2015  . History of esophageal stricture 01/28/2015  . Atypical nevus 01/27/2015  . Carpal tunnel syndrome 01/27/2015  . Insomnia, persistent 01/27/2015  . DDD (degenerative disc disease), cervical 01/27/2015  .  Panic attacks 01/27/2015  . Gastric reflux 01/27/2015  . Headache, hemiplegic migraine 01/27/2015  . H/O transient cerebral ischemia 01/27/2015  . Hypertension, benign 01/27/2015  . Calculus of kidney 01/27/2015  . Adult BMI 30+ 01/27/2015  . Vitamin D deficiency 01/27/2015  . Snores 01/27/2015    Past Surgical History:  Procedure Laterality Date  . HYSTEROSCOPY WITH D & C N/A 02/07/2015   Procedure: DILATATION AND CURETTAGE /HYSTEROSCOPY;  Surgeon: Will Bonnet, MD;  Location: [facility] OR[facility];  Service: Gynecology;  Laterality: N/A;  . HYSTEROSCOPY WITH NOVASURE N/A 02/07/2015   Procedure: HYSTEROSCOPY WITH NOVASURE;  Surgeon: Will Bonnet, MD;  Location: [facility] OR[facility];  Service: Gynecology;  Laterality: N/A;  . TOOTH EXTRACTION    . TUBAL LIGATION  1989    Family History  Problem Relation Age of Onset  . Asthma Mother   . Heart disease Mother   . Hyperlipidemia Mother   . Emphysema Mother   . Tremor Mother   . Macular degeneration Mother   . Asthma Son   . Heart attack Father   . Cancer Father        Unknown  . Urolithiasis Son   . Tremor Sister   . Tremor Brother   . Macular degeneration Maternal Grandmother   . Alzheimer's disease Paternal Grandfather   . Breast cancer Neg Hx       Current Outpatient Medications:  .  ALPRAZolam (XANAX) 0.5 MG  tablet, Take 1 tablet (0.5 mg total) by mouth daily as needed for anxiety (05-1 tablet)., Disp: 30 tablet, Rfl: 0 .  aspirin-acetaminophen-caffeine (EXCEDRIN MIGRAINE) [id number] MG per tablet, Take 2 tablets by mouth every 6 (six) hours as needed for pain., Disp: , Rfl:  .  Cholecalciferol (VITAMIN D) 2000 units tablet, Take 2,000 Units by mouth daily., Disp: , Rfl:  .  Multiple Vitamins-Minerals (CENTRUM SILVER ADULT 50+ PO), Take 1 tablet by mouth daily., Disp: , Rfl:  .  omeprazole (PRILOSEC) 20 MG capsule, TAKE 1 CAPSULE BY MOUTH EVERY DAY, Disp: 90 capsule, Rfl: 1 .  sodium chloride (OCEAN) 0.65 % nasal spray, Place 1 spray  into both nostrils daily as needed (for allergies). , Disp: , Rfl:  .  TURMERIC CURCUMIN PO, Take 1 capsule by mouth daily. 900 mg daily, Disp: , Rfl:   Allergies  Allergen Reactions  . Claritin  [Loratadine] Other (See Comments)  . Codeine Other (See Comments)    Severe headache   . Entex Lq  [Phenylephrine-Guaifenesin] Other (See Comments)  . Fexofenadine Other (See Comments)  . Loratadine-Pseudoephedrine Er Other (See Comments)  . Pseudoephedrine Other (See Comments)  . Tramadol Nausea Only    I personally reviewed active problem list, medication list, allergies, family history, social history, health maintenance with the patient/caregiver today.   ROS  Ten systems reviewed and is negative except as mentioned in HPI   Objective  Virtual encounter, vitals not obtained.  Body mass index is 31.13 kg/m.  Physical Exam  Awake, alert and oriented. Constant cough during conversation   PHQ2/9: Depression screen [facility] 2/9 08/24/2019 04/13/2019 10/10/2018 02/09/2018 11/30/2017  Decreased Interest 0 0 0 0 0  Down, Depressed, Hopeless 0 0 0 0 0  PHQ - 2 Score 0 0 0 0 0  Altered sleeping 0 0 0 - -  Tired, decreased energy 0 0 0 - -  Change in appetite 0 0 0 - -  Feeling bad or failure about yourself  0 0 0 - -  Trouble concentrating 0 0 0 - -  Moving slowly or fidgety/restless 0 0 0 - -  Suicidal thoughts 0 0 0 - -  PHQ-9 Score 0 0 0 - -  Difficult doing work/chores Not difficult at all - - - -   PHQ-2/9 Result is negative.    Fall Risk: Fall Risk  08/24/2019 04/13/2019 10/10/2018 02/09/2018 11/30/2017  Falls in the past year? 0 0 0 No Yes  Number falls in past yr: 0 0 0 - 1  Comment - - - - -  Injury with Fall? 0 0 0 - No  Follow up - - - - Falls prevention discussed     Assessment & Plan  1. COVID-19 virus infection  - umeclidinium-vilanterol (ANORO ELLIPTA) 62.5-25 MCG/INH AEPB; Inhale 1 puff into the lungs daily.  Dispense: 60 each; Refill: 0 - DG Chest 2 View;  Future  Excuse for work , return Monday   2. Cough  - benzonatate (TESSALON) 100 MG capsule; Take 1-2 capsules (100-200 mg total) by mouth 2 (two) times daily as needed.  Dispense: 40 capsule; Refill: 0 - chlorpheniramine-HYDROcodone (TUSSIONEX PENNKINETIC ER) 10-8 MG/5ML SUER; Take 5 mLs by mouth at bedtime as needed.  Dispense: 140 mL; Refill: 0 - umeclidinium-vilanterol (ANORO ELLIPTA) 62.5-25 MCG/INH AEPB; Inhale 1 puff into the lungs daily.  Dispense: 60 each; Refill: 0  3. Nausea  - ondansetron (ZOFRAN) 4 MG tablet; Take 1 tablet (4 mg total) by mouth  every 8 (eight) hours as needed for nausea or vomiting.  Dispense: 20 tablet; Refill: 0  I discussed the assessment and treatment plan with the patient. The patient was provided an opportunity to ask questions and all were answered. The patient agreed with the plan and demonstrated an understanding of the instructions.   The patient was advised to call back or seek an in-person evaluation if the symptoms worsen or if the condition fails to improve as anticipated.  I provided 15 minutes of non-face-to-face time during this encounter.  Loistine Chance, MD

## 2019-08-25 ENCOUNTER — Ambulatory Visit: Payer: BC Managed Care – PPO | Admitting: Family Medicine

## 2019-08-25 ENCOUNTER — Telehealth: Payer: Self-pay | Admitting: Family Medicine

## 2019-08-25 NOTE — Telephone Encounter (Signed)
Pt wants to know if Ventolin Inhaler can be called in for her. Her insurance will cover this

## 2019-08-27 ENCOUNTER — Other Ambulatory Visit: Payer: Self-pay | Admitting: Family Medicine

## 2019-08-27 MED ORDER — ALBUTEROL SULFATE HFA 108 (90 BASE) MCG/ACT IN AERS: 2.0000 | INHALATION_SPRAY | Freq: Four times a day (QID) | RESPIRATORY_TRACT | 0 refills | Status: DC | PRN

## 2019-08-28 ENCOUNTER — Encounter: Payer: Self-pay | Admitting: Family Medicine

## 2019-08-28 ENCOUNTER — Telehealth: Payer: Self-pay

## 2019-08-28 NOTE — Telephone Encounter (Signed)
Patient states she is still experiencing SOB, when trying to do something got short winded. Unable to return due to being fatigue, SOB, coughing but getting better.

## 2019-08-29 ENCOUNTER — Ambulatory Visit
Admission: RE | Admit: 2019-08-29 | Discharge: 2019-08-29 | Disposition: A | Discharge status: Home / Self Care | Payer: BC Managed Care – PPO | Source: Ambulatory Visit | Attending: Family Medicine | Admitting: Family Medicine

## 2019-08-29 ENCOUNTER — Ambulatory Visit: Admission: RE | Admit: 2019-08-29 | Payer: BC Managed Care – PPO | Source: Ambulatory Visit | Admitting: *Deleted

## 2019-08-29 ENCOUNTER — Ambulatory Visit (INDEPENDENT_AMBULATORY_CARE_PROVIDER_SITE_OTHER): Payer: BC Managed Care – PPO | Admitting: Internal Medicine

## 2019-08-29 ENCOUNTER — Other Ambulatory Visit: Payer: Self-pay

## 2019-08-29 VITALS — BP 130/71 | HR 67 | Temp 98.5°F | Resp 20 | Ht 62.0 in | Wt 176.2 lb

## 2019-08-29 DIAGNOSIS — E860 Dehydration: Secondary | ICD-10-CM | POA: Diagnosis not present

## 2019-08-29 DIAGNOSIS — R079 Chest pain, unspecified: Secondary | ICD-10-CM | POA: Diagnosis not present

## 2019-08-29 DIAGNOSIS — R531 Weakness: Secondary | ICD-10-CM

## 2019-08-29 DIAGNOSIS — U071 COVID-19: Secondary | ICD-10-CM | POA: Insufficient documentation

## 2019-08-29 DIAGNOSIS — R197 Diarrhea, unspecified: Secondary | ICD-10-CM

## 2019-08-29 DIAGNOSIS — R05 Cough: Secondary | ICD-10-CM

## 2019-08-29 DIAGNOSIS — R059 Cough, unspecified: Secondary | ICD-10-CM

## 2019-08-29 DIAGNOSIS — R5383 Other fatigue: Secondary | ICD-10-CM

## 2019-08-29 DIAGNOSIS — R0602 Shortness of breath: Secondary | ICD-10-CM

## 2019-08-29 NOTE — Telephone Encounter (Signed)
Patient is going to the [facility] today 08/29/2019 at 5:30 p.m. to be seen.

## 2019-08-29 NOTE — Patient Instructions (Addendum)
Take a dose of pepto after each diarrhea episode, but not take more than directed in the box. Avoid dairy Eat 1-3 bananas and potatoes to help replenish your potasium Drink bone organic bone broth which will help heal your intestines 8-16 oz a day.  Follow up with your family Dr if the diarrhea persists in case stool cultures need to be ordered.  Rehydration, Adult Rehydration is the replacement of body fluids and salts and minerals (electrolytes) that are lost during dehydration. Dehydration is when there is not enough fluid or water in the body. This happens when you lose more fluids than you take in. Common causes of dehydration include:  Vomiting.  Diarrhea.  Excessive sweating, such as from heat exposure or exercise.  Taking medicines that cause the body to lose excess fluid (diuretics).  Impaired kidney function.  Not drinking enough fluid.  Certain illnesses or infections.  Certain poorly controlled long-term (chronic) illnesses, such as diabetes, heart disease, and kidney disease.  Symptoms of mild dehydration may include thirst, dry lips and mouth, dry skin, and dizziness. Symptoms of severe dehydration may include increased heart rate, confusion, fainting, and not urinating. You can rehydrate by drinking certain fluids or getting fluids through an IV tube, as told by your health care provider. What are the risks? Generally, rehydration is safe. However, one problem that can happen is taking in too much fluid (overhydration). This is rare. If overhydration happens, it can cause an electrolyte imbalance, kidney failure, or a decrease in salt (sodium) levels in the body. How to rehydrate Follow instructions from your health care provider for rehydration. The kind of fluid you should drink and the amount you should drink depend on your condition.  If directed by your health care provider, drink an oral rehydration solution (ORS). This is a drink designed to treat dehydration  that is found in pharmacies and retail stores. ? Make an ORS by following instructions on the package. ? Start by drinking small amounts, about  cup (120 mL) every 5-10 minutes. ? Slowly increase how much you drink until you have taken the amount recommended by your health care provider.  Drink enough clear fluids to keep your urine clear or pale yellow. If you were instructed to drink an ORS, finish the ORS first, then start slowly drinking other clear fluids. Drink fluids such as: ? Water. Do not drink only water. Doing that can lead to having too little sodium in your body (hyponatremia). ? Ice chips. ? Fruit juice that you have added water to (diluted juice). ? Low-calorie sports drinks.  If you are severely dehydrated, your health care provider may recommend that you receive fluids through an IV tube in the hospital.  Do not take sodium tablets. Doing that can lead to the condition of having too much sodium in your body (hypernatremia). Eating while you rehydrate Follow instructions from your health care provider about what to eat while you rehydrate. Your health care provider may recommend that you slowly begin eating regular foods in small amounts.  Eat foods that contain a healthy balance of electrolytes, such as bananas, oranges, potatoes, tomatoes, and spinach.  Avoid foods that are greasy or contain a lot of fat or sugar.  In some cases, you may get nutrition through a feeding tube that is passed through your nose and into your stomach (nasogastric tube, or NG tube). This may be done if you have uncontrolled vomiting or diarrhea. Beverages to avoid Certain beverages may make dehydration worse. While  you rehydrate, avoid:  Alcohol.  Caffeine.  Drinks that contain a lot of sugar. These include: ? High-calorie sports drinks. ? Fruit juice that is not diluted. ? Soda.  Check nutrition labels to see how much sugar or caffeine a beverage contains. Signs of dehydration  recovery You may be recovering from dehydration if:  You are urinating more often than before you started rehydrating.  Your urine is clear or pale yellow.  Your energy level improves.  You vomit less frequently.  You have diarrhea less frequently.  Your appetite improves or returns to normal.  You feel less dizzy or less light-headed.  Your skin tone and color start to look more normal. Contact a health care provider if:  You continue to have symptoms of mild dehydration, such as: ? Thirst. ? Dry lips. ? Slightly dry mouth. ? Dry, warm skin. ? Dizziness.  You continue to vomit or have diarrhea. Get help right away if:  You have symptoms of dehydration that get worse.  You feel: ? Confused. ? Weak. ? Like you are going to faint.  You have not urinated in 6-8 hours.  You have very dark urine.  You have trouble breathing.  Your heart rate while sitting still is over 100 beats a minute.  You cannot drink fluids without vomiting.  You have vomiting or diarrhea that: ? Gets worse. ? Does not go away.  You have a fever. This information is not intended to replace advice given to you by your health care provider. Make sure you discuss any questions you have with your health care provider. Document Revised: 07/02/2017 Document Reviewed: 09/13/2015 Elsevier Patient Education  [place].  Sinusitis, Adult Sinusitis is soreness and swelling (inflammation) of your sinuses. Sinuses are hollow spaces in the bones around your face. They are located:  Around your eyes.  In the middle of your forehead.  Behind your nose.  In your cheekbones. Your sinuses and nasal passages are lined with a fluid called mucus. Mucus drains out of your sinuses. Swelling can trap mucus in your sinuses. This lets germs (bacteria, virus, or fungus) grow, which leads to infection. Most of the time, this condition is caused by a virus. What are the causes? This condition is caused  by:  Allergies.  Asthma.  Germs.  Things that block your nose or sinuses.  Growths in the nose (nasal polyps).  Chemicals or irritants in the air.  Fungus (rare). What increases the risk? You are more likely to develop this condition if:  You have a weak body defense system (immune system).  You do a lot of swimming or diving.  You use nasal sprays too much.  You smoke. What are the signs or symptoms? The main symptoms of this condition are pain and a feeling of pressure around the sinuses. Other symptoms include:  Stuffy nose (congestion).  Runny nose (drainage).  Swelling and warmth in the sinuses.  Headache.  Toothache.  A cough that may get worse at night.  Mucus that collects in the throat or the back of the nose (postnasal drip).  Being unable to smell and taste.  Being very tired (fatigue).  A fever.  Sore throat.  Bad breath. How is this diagnosed? This condition is diagnosed based on:  Your symptoms.  Your medical history.  A physical exam.  Tests to find out if your condition is short-term (acute) or long-term (chronic). Your doctor may: ? Check your nose for growths (polyps). ? Check your sinuses  using a tool that has a light (endoscope). ? Check for allergies or germs. ? Do imaging tests, such as an MRI or CT scan. How is this treated? Treatment for this condition depends on the cause and whether it is short-term or long-term.  If caused by a virus, your symptoms should go away on their own within 10 days. You may be given medicines to relieve symptoms. They include: ? Medicines that shrink swollen tissue in the nose. ? Medicines that treat allergies (antihistamines). ? A spray that treats swelling of the nostrils. ? Rinses that help get rid of thick mucus in your nose (nasal saline washes).  If caused by bacteria, your doctor may wait to see if you will get better without treatment. You may be given antibiotic medicine if you  have: ? A very bad infection. ? A weak body defense system.  If caused by growths in the nose, you may need to have surgery. Follow these instructions at home: Medicines  Take, use, or apply over-the-counter and prescription medicines only as told by your doctor. These may include nasal sprays.  If you were prescribed an antibiotic medicine, take it as told by your doctor. Do not stop taking the antibiotic even if you start to feel better. Hydrate and humidify   Drink enough water to keep your pee (urine) pale yellow.  Use a cool mist humidifier to keep the humidity level in your home above 50%.  Breathe in steam for 10-15 minutes, 3-4 times a day, or as told by your doctor. You can do this in the bathroom while a hot shower is running.  Try not to spend time in cool or dry air. Rest  Rest as much as you can.  Sleep with your head raised (elevated).  Make sure you get enough sleep each night. General instructions   Put a warm, moist washcloth on your face 3-4 times a day, or as often as told by your doctor. This will help with discomfort.  Wash your hands often with soap and water. If there is no soap and water, use hand sanitizer.  Do not smoke. Avoid being around people who are smoking (secondhand smoke).  Keep all follow-up visits as told by your doctor. This is important. Contact a doctor if:  You have a fever.  Your symptoms get worse.  Your symptoms do not get better within 10 days. Get help right away if:  You have a very bad headache.  You cannot stop throwing up (vomiting).  You have very bad pain or swelling around your face or eyes.  You have trouble seeing.  You feel confused.  Your neck is stiff.  You have trouble breathing. Summary  Sinusitis is swelling of your sinuses. Sinuses are hollow spaces in the bones around your face.  This condition is caused by tissues in your nose that become inflamed or swollen. This traps germs. These can  lead to infection.  If you were prescribed an antibiotic medicine, take it as told by your doctor. Do not stop taking it even if you start to feel better.  Keep all follow-up visits as told by your doctor. This is important. This information is not intended to replace advice given to you by your health care provider. Make sure you discuss any questions you have with your health care provider. Document Revised: 12/20/2017 Document Reviewed: 12/20/2017 Elsevier Patient Education  [place].   Dehydration, Adult Dehydration is condition in which there is not enough water  or other fluids in the body. This happens when a person loses more fluids than he or she takes in. Important body parts cannot work right without the right amount of fluids. Any loss of fluids from the body can cause dehydration. Dehydration can be mild, worse, or very bad. It should be treated right away to keep it from getting very bad. What are the causes? This condition may be caused by:  Conditions that cause loss of water or other fluids, such as: ? Watery poop (diarrhea). ? Vomiting. ? Sweating a lot. ? Peeing (urinating) a lot.  Not drinking enough fluids, especially when you: ? Are ill. ? Are doing things that take a lot of energy to do.  Other illnesses and conditions, such as fever or infection.  Certain medicines, such as medicines that take extra fluid out of the body (diuretics).  Lack of safe drinking water.  Not being able to get enough water and food. What increases the risk? The following factors may make you more likely to develop this condition:  Having a long-term (chronic) illness that has not been treated the right way, such as: ? Diabetes. ? Heart disease. ? Kidney disease.  Being 33 years of age or older.  Having a disability.  Living in a place that is high above the ground or sea (high in altitude). The thinner, dried air causes more fluid loss.  Doing exercises that put  stress on your body for a long time. What are the signs or symptoms? Symptoms of dehydration depend on how bad it is. Mild or worse dehydration  Thirst.  Dry lips or dry mouth.  Feeling dizzy or light-headed, especially when you stand up from sitting.  Muscle cramps.  Your body making: ? Dark pee (urine). Pee may be the color of tea. ? Less pee than normal. ? Less tears than normal.  Headache. Very bad dehydration  Changes in skin. Skin may: ? Be cold to the touch (clammy). ? Be blotchy or pale. ? Not go back to normal right after you lightly pinch it and let it go.  Little or no tears, pee, or sweat.  Changes in vital signs, such as: ? Fast breathing. ? Low blood pressure. ? Weak pulse. ? Pulse that is more than 100 beats a minute when you are sitting still.  Other changes, such as: ? Feeling very thirsty. ? Eyes that look hollow (sunken). ? Cold hands and feet. ? Being mixed up (confused). ? Being very tired (lethargic) or having trouble waking from sleep. ? Short-term weight loss. ? Loss of consciousness. How is this treated? Treatment for this condition depends on how bad it is. Treatment should start right away. Do not wait until your condition gets very bad. Very bad dehydration is an emergency. You will need to go to a hospital.  Mild or worse dehydration can be treated at home. You may be asked to: ? Drink more fluids. ? Drink an oral rehydration solution (ORS). This drink helps get the right amounts of fluids and salts and minerals in the blood (electrolytes).  Very bad dehydration can be treated: ? With fluids through an IV tube. ? By getting normal levels of salts and minerals in your blood. This is often done by giving salts and minerals through a tube. The tube is passed through your nose and into your stomach. ? By treating the root cause. Follow these instructions at home: Oral rehydration solution If told by your doctor, drink an ORS:  Make an  ORS. Use instructions on the package.  Start by drinking small amounts, about  cup (120 mL) every 5-10 minutes.  Slowly drink more until you have had the amount that your doctor said to have. Eating and drinking         Drink enough clear fluid to keep your pee pale yellow. If you were told to drink an ORS, finish the ORS first. Then, start slowly drinking other clear fluids. Drink fluids such as: ? Water. Do not drink only water. Doing that can make the salt (sodium) level in your body get too low. ? Water from ice chips you suck on. ? Fruit juice that you have added water to (diluted). ? Low-calorie sports drinks.  Eat foods that have the right amounts of salts and minerals, such as: ? Bananas. ? Oranges. ? Potatoes. ? Tomatoes. ? Spinach.  Do not drink alcohol.  Avoid: ? Drinks that have a lot of sugar. These include:  High-calorie sports drinks.  Fruit juice that you did not add water to.  Soda.  Caffeine. ? Foods that are greasy or have a lot of fat or sugar. General instructions  Take over-the-counter and prescription medicines only as told by your doctor.  Do not take salt tablets. Doing that can make the salt level in your body get too high.  Return to your normal activities as told by your doctor. Ask your doctor what activities are safe for you.  Keep all follow-up visits as told by your doctor. This is important. Contact a doctor if:  You have pain in your belly (abdomen) and the pain: ? Gets worse. ? Stays in one place.  You have a rash.  You have a stiff neck.  You get angry or annoyed (irritable) more easily than normal.  You are more tired or have a harder time waking than normal.  You feel: ? Weak or dizzy. ? Very thirsty. Get help right away if you have:  Any symptoms of very bad dehydration.  Symptoms of vomiting, such as: ? You cannot eat or drink without vomiting. ? Your vomiting gets worse or does not go away. ? Your vomit  has blood or green stuff in it.  Symptoms that get worse with treatment.  A fever.  A very bad headache.  Problems with peeing or pooping (having a bowel movement), such as: ? Watery poop that gets worse or does not go away. ? Blood in your poop (stool). This may cause poop to look black and tarry. ? Not peeing in 6-8 hours. ? Peeing only a small amount of very dark pee in 6-8 hours.  Trouble breathing. These symptoms may be an emergency. Do not wait to see if the symptoms will go away. Get medical help right away. Call your local emergency services (911 in the [place] Do not drive yourself to the hospital. Summary  Dehydration is a condition in which there is not enough water or other fluids in the body. This happens when a person loses more fluids than he or she takes in.  Treatment for this condition depends on how bad it is. Treatment should be started right away. Do not wait until your condition gets very bad.  Drink enough clear fluid to keep your pee pale yellow. If you were told to drink an oral rehydration solution (ORS), finish the ORS first. Then, start slowly drinking other clear fluids.  Take over-the-counter and prescription medicines only as told by your doctor.  Get  help right away if you have any symptoms of very bad dehydration. This information is not intended to replace advice given to you by your health care provider. Make sure you discuss any questions you have with your health care provider. Document Revised: 03/02/2019 Document Reviewed: 03/02/2019 Elsevier Patient Education  [place].

## 2019-08-29 NOTE — Progress Notes (Signed)
This visit occurred during the SARS-CoV-2 public health emergency.  Safety protocols were in place, including screening questions prior to the visit, additional usage of staff PPE, and extensive cleaning of exam room while observing appropriate contact time as indicated for disinfecting solutions.  Subjective:     Patient ID: Monique Daniels , female    DOB: [birth year] , 59 y.o.   MRN: [id number]   Chief Complaint  Patient presents with  . Shortness of Breath    fatigue, diarrhea, cough and chest tightiness x3 weeks. Pt diagnose with COVID x 3 weeks ago    HPI Onset of diarrhea 1/5, then HA 1/6 and worked the next day, but the HA got worse, that night had chills and fever at highest of 99.5. 1/8 she developed nausea, body aches and fever. She lost taste and smell 1/10. She developed a mild cough 1/11 but her boss wanted her to return to work 1/12 and she did. She had test for covid 1/7 and on 1/11 was positive. She went back to work 1/19 and 20th, and called in 1/21 left work early since she was not feeling better. SOB started 1/13 with activity but denies chest pain. Has had low grade temp all week since she started getting sick. She did not measure it today. Her cough is non productive and has a thigh drainage on the back on her throat. Has been using saline spray. PND when she clears her throat and blows her nose is yellow. Her cough is slightly better. She has had sinus pressure. Her HA's initially were on occipital area, but now are there and sinus area.  She has also been taking Vit C, Elderbarry, Advil. States she has been drinking well. The diarrhea has gotten worse today and has gone x 20+ times, and thought it was better since she only went 7 times yesterday and the day before. Denies any blood in the stool. Denies use of any antibiotics in the past weeks. No one else has diarrhea at home. She has been eating things at home. She works in the [employer] in [place]. Today she feels weak,  fatigued, but denies dizziness or feeling lightheaded. She does not feel she could work the rest of the week.    Past Medical History:  Diagnosis Date  . Allergic rhinitis, cause unspecified   . Benign neoplasm of skin, site unspecified   . Calculus of kidney    stones  . Carpal tunnel syndrome   . Degeneration of cervical intervertebral disc   . Dizziness and giddiness   . Enthesopathy of hip region   . Esophageal reflux   . Essential hypertension, malignant   . Irregular menstrual cycle   . Migraine, unspecified, without mention of intractable migraine without mention of status migrainosus   . Obesity, unspecified   . Other dyspnea and respiratory abnormality   . Other malaise and fatigue   . Ovarian cyst   . Palpitations   . Symptomatic menopausal or female climacteric states   . Transient ischemic attack (TIA), and cerebral infarction without residual deficits(V12.54)   . Unspecified vitamin D deficiency      Family History  Problem Relation Age of Onset  . Asthma Mother   . Heart disease Mother   . Hyperlipidemia Mother   . Emphysema Mother   . Tremor Mother   . Macular degeneration Mother   . Asthma Son   . Heart attack Father   . Cancer Father  Unknown  . Urolithiasis Son   . Tremor Sister   . Tremor Brother   . Macular degeneration Maternal Grandmother   . Alzheimer's disease Paternal Grandfather   . Breast cancer Neg Hx      Current Outpatient Medications:  .  albuterol (VENTOLIN HFA) 108 (90 Base) MCG/ACT inhaler, Inhale 2 puffs into the lungs every 6 (six) hours as needed for wheezing or shortness of breath., Disp: 18 g, Rfl: 0 .  aspirin-acetaminophen-caffeine (EXCEDRIN MIGRAINE) 250-250-65 MG per tablet, Take 2 tablets by mouth every 6 (six) hours as needed for pain., Disp: , Rfl:  .  benzonatate (TESSALON) 100 MG capsule, Take 1-2 capsules (100-200 mg total) by mouth 2 (two) times daily as needed., Disp: 40 capsule, Rfl: 0 .   chlorpheniramine-HYDROcodone (TUSSIONEX PENNKINETIC ER) 10-8 MG/5ML SUER, Take 5 mLs by mouth at bedtime as needed., Disp: 140 mL, Rfl: 0 .  Cholecalciferol (VITAMIN D) 2000 units tablet, Take 2,000 Units by mouth daily., Disp: , Rfl:  .  omeprazole (PRILOSEC) 20 MG capsule, TAKE 1 CAPSULE BY MOUTH EVERY DAY, Disp: 90 capsule, Rfl: 1 .  ondansetron (ZOFRAN) 4 MG tablet, Take 1 tablet (4 mg total) by mouth every 8 (eight) hours as needed for nausea or vomiting., Disp: 20 tablet, Rfl: 0 .  sodium chloride (OCEAN) 0.65 % nasal spray, Place 1 spray into both nostrils daily as needed (for allergies). , Disp: , Rfl:  .  TURMERIC CURCUMIN PO, Take 1 capsule by mouth daily. 900 mg daily, Disp: , Rfl:  .  umeclidinium-vilanterol (ANORO ELLIPTA) 62.5-25 MCG/INH AEPB, Inhale 1 puff into the lungs daily., Disp: 60 each, Rfl: 0 .  ALPRAZolam (XANAX) 0.5 MG tablet, Take 1 tablet (0.5 mg total) by mouth daily as needed for anxiety (05-1 tablet). (Patient not taking: Reported on 08/29/2019), Disp: 30 tablet, Rfl: 0 .  Multiple Vitamins-Minerals (CENTRUM SILVER ADULT 50+ PO), Take 1 tablet by mouth daily., Disp: , Rfl:    Allergies  Allergen Reactions  . Claritin  [Loratadine] Other (See Comments)  . Codeine Other (See Comments)    Severe headache   . Entex Lq  [Phenylephrine-Guaifenesin] Other (See Comments)  . Fexofenadine Other (See Comments)  . Loratadine-Pseudoephedrine Er Other (See Comments)  . Pseudoephedrine Other (See Comments)  . Tramadol Nausea Only     Review of Systems  Had had HTN in the past and off meds since wt loss x 1 y. Denies leg swelling. She cant lay flat due to pulling sensation on chest and PND when she lays flat.  + N/D, no blood in stool.  Today's Vitals   08/29/19 1752  BP: (!) 170/67  Pulse: (!) 58  Resp: 20  Temp: 98.5 F (36.9 C)  TempSrc: Oral  SpO2: 98%  Weight: 176 lb 3.2 oz (79.9 kg)  Height: 5\' 2"  (1.575 m)   Body mass index is 32.23 kg/m.   Objective:   Physical Exam   Constitutional: She is oriented to person, place, and time. She appears well-developed and tired. Is in No distress.  HENT: has mild tender maxillary and frontal sinuses Head: Normocephalic and atraumatic.  Right Ear: External ear normal.  Left Ear: External ear normal.  Nose: Nose normal.  Eyes: Conjunctivae are normal. Right eye exhibits no discharge. Left eye exhibits no discharge. No scleral icterus.  Neck: Neck supple. No thyromegaly present.  No carotid bruits bilaterally  Cardiovascular: Normal rate and regular rhythm.  No murmur heard. Pulmonary/Chest: Effort normal and breath sounds normal.  No respiratory distress.  ABD- + BS, soft, mild tenderness on epigastric region, but no guarding or rebound noted.  Musculoskeletal: Normal range of motion. She exhibits no edema.  Lymphadenopathy:    She has no cervical adenopathy.  Neurological: She is alert and oriented to person, place, and time.  Skin: Skin is warm and dry. Capillary refill takes less than 2 seconds. No rash noted. She is not diaphoretic.  Psychiatric: She has a normal mood and affect. Her behavior is normal. Judgment and thought content normal.  Nursing note reviewed.     Assessment And Plan:  1. SOB (shortness of breath)- with normal PO2. Placed on ventolin inhaler q 4h for 5-7 days.  - DG Chest 2 View - MYCHART COVID-19 HOME MONITORING PROGRAM  2. Cough- improving, but not resolving - DG Chest 2 View - MYCHART COVID-19 HOME MONITORING PROGRAM - Temperature monitoring; Future  3. Other fatigue- post covid - COMPLETE METABOLIC PANEL WITH GFR - CBC With Differential - MYCHART COVID-19 HOME MONITORING PROGRAM  4. Weakness- post covid. Explained this will improve, but could be from dehydration and electrolite inbalance.  - COMPLETE METABOLIC PANEL WITH GFR - CBC With Differential - MYCHART COVID-19 HOME MONITORING PROGRAM - Temperature monitoring; Future   Needs to call PCP tomorrow to have  them review labs with them since we are closed tomorrow.  5. Dehydration- acute. She declined going to ER, wants to try oral hydration at home. See instructions for diarrhea care. If it persists may need stool studies done - MYCHART COVID-19 HOME MONITORING PROGRAM - Temperature monitoring; Future  6. COVID-19 virus infection- with persistent diarrhea, and improved cough,  - MYCHART COVID-19 HOME MONITORING PROGRAM - Temperature monitoring; Future 7. _ Diarrhea needs to take Pepto more regularly and follow BRAT diet. If not better in 48h, needs to see PCP  Geana Walts RODRIGUEZ-SOUTHWORTH, PA-C    THE PATIENT IS ENCOURAGED TO PRACTICE SOCIAL DISTANCING DUE TO THE COVID-19 PANDEMIC.

## 2019-08-30 LAB — CBC WITH DIFFERENTIAL
Basophils Absolute: 0.1 10*3/uL (ref 0.0–0.2)
Basos: 1 %
EOS (ABSOLUTE): 0.4 10*3/uL (ref 0.0–0.4)
Eos: 5 %
Hematocrit: 42.9 % (ref 34.0–46.6)
Hemoglobin: 14.5 g/dL (ref 11.1–15.9)
Immature Grans (Abs): 0.1 10*3/uL (ref 0.0–0.1)
Immature Granulocytes: 1 %
Lymphocytes Absolute: 2.2 10*3/uL (ref 0.7–3.1)
Lymphs: 27 %
MCH: 29.8 pg (ref 26.6–33.0)
MCHC: 33.8 g/dL (ref 31.5–35.7)
MCV: 88 fL (ref 79–97)
Monocytes Absolute: 0.7 10*3/uL (ref 0.1–0.9)
Monocytes: 8 %
Neutrophils Absolute: 4.8 10*3/uL (ref 1.4–7.0)
Neutrophils: 58 %
RBC: 4.87 x10E6/uL (ref 3.77–5.28)
RDW: 12.8 % (ref 11.7–15.4)
WBC: 8.3 10*3/uL (ref 3.4–10.8)

## 2019-09-21 ENCOUNTER — Ambulatory Visit (INDEPENDENT_AMBULATORY_CARE_PROVIDER_SITE_OTHER): Payer: BC Managed Care – PPO | Admitting: Family Medicine

## 2019-09-21 ENCOUNTER — Other Ambulatory Visit: Payer: Self-pay

## 2019-09-21 ENCOUNTER — Encounter: Payer: Self-pay | Admitting: Family Medicine

## 2019-09-21 VITALS — BP 147/71 | Temp 97.7°F

## 2019-09-21 DIAGNOSIS — I10 Essential (primary) hypertension: Secondary | ICD-10-CM | POA: Diagnosis not present

## 2019-09-21 DIAGNOSIS — E8881 Metabolic syndrome: Secondary | ICD-10-CM

## 2019-09-21 DIAGNOSIS — K219 Gastro-esophageal reflux disease without esophagitis: Secondary | ICD-10-CM | POA: Diagnosis not present

## 2019-09-21 DIAGNOSIS — F410 Panic disorder [episodic paroxysmal anxiety] without agoraphobia: Secondary | ICD-10-CM

## 2019-09-21 DIAGNOSIS — Z8616 Personal history of COVID-19: Secondary | ICD-10-CM | POA: Diagnosis not present

## 2019-09-21 MED ORDER — ALPRAZOLAM 0.5 MG PO TABS: 0.5000 mg | ORAL_TABLET | Freq: Every day | ORAL | 0 refills | Status: DC | PRN

## 2019-09-21 MED ORDER — ATENOLOL 25 MG PO TABS: 12.5000 mg | ORAL_TABLET | Freq: Every evening | ORAL | 0 refills | Status: DC

## 2019-09-21 NOTE — Progress Notes (Signed)
Name: Monique Daniels   MRN: [id number]    DOB: [birth year]   Date:09/21/2019       Progress Note  Subjective  Chief Complaint  Chief Complaint  Patient presents with  . Headache    Since 09/17/19 with shaking hands and achy jaw  . Medication Refill    alprazolam    I connected with  Cliffie D Neu  on 09/21/19 at 11:00 AM EST by a video enabled telemedicine application and verified that I am speaking with the correct person using two identifiers.  I discussed the limitations of evaluation and management by telemedicine and the availability of in person appointments. The patient expressed understanding and agreed to proceed. Staff also discussed with the patient that there may be a patient responsible charge related to this service. Patient Location: at work  Provider Location: at home  HPI  History of COVID-19: she was diagnosed on 08/19/2019, she states she still has intermittent diarrhea, nausea when she eats, also still fatigued . She received the vaccine on 09/11/2019.   Headache: she woke up on Feb 14 th later than usual, hands were shaking, felt like her knees were going to buckle under her when she got up. She sat down and felt better afterwards. She continues to have intermittent headaches , on the frontal side and radiates to nuchal area, this past weekend it was associated with nausea, no weakness, visual problems or problems speaking.   Elevated bp she has a history of HTN she states since COVID -19 she has not been as active, no longer going to the gym, but was always below 120/80's and has been in the 140's, she has also gained some weight.   Anxiety: she is tired of of not feeling well, she has been more anxious, not sure if recent panic attacks but would like to get a refill of alprazolam   Metabolic Syndrome: she was doing well with diet, going to the gym, but since back problems and covid she has not been active, she denies polyphagia, polydipsia or polyuria.    GERD: doing well on omeprazole, , discussed trying to skip doses and try activated charcoal   Patient Active Problem List   Diagnosis Date Noted  . Metabolic syndrome 123456  . Cervical spinal stenosis 04/08/2016  . Prediabetes 01/09/2016  . Dyslipidemia 10/03/2015  . Seasonal allergic rhinitis 04/29/2015  . History of esophageal stricture 01/28/2015  . Atypical nevus 01/27/2015  . Carpal tunnel syndrome 01/27/2015  . Insomnia, persistent 01/27/2015  . DDD (degenerative disc disease), cervical 01/27/2015  . Panic attacks 01/27/2015  . Gastric reflux 01/27/2015  . Headache, hemiplegic migraine 01/27/2015  . H/O transient cerebral ischemia 01/27/2015  . Hypertension, benign 01/27/2015  . Calculus of kidney 01/27/2015  . Adult BMI 30+ 01/27/2015  . Vitamin D deficiency 01/27/2015  . Snores 01/27/2015    Past Surgical History:  Procedure Laterality Date  . HYSTEROSCOPY WITH D & C N/A 02/07/2015   Procedure: DILATATION AND CURETTAGE /HYSTEROSCOPY;  Surgeon: Will Bonnet, MD;  Location: [facility] OR[facility];  Service: Gynecology;  Laterality: N/A;  . HYSTEROSCOPY WITH NOVASURE N/A 02/07/2015   Procedure: HYSTEROSCOPY WITH NOVASURE;  Surgeon: Will Bonnet, MD;  Location: [facility] OR[facility];  Service: Gynecology;  Laterality: N/A;  . TOOTH EXTRACTION    . TUBAL LIGATION  1989    Family History  Problem Relation Age of Onset  . Asthma Mother   . Heart disease Mother   . Hyperlipidemia Mother   .  Emphysema Mother   . Tremor Mother   . Macular degeneration Mother   . Asthma Son   . Heart attack Father   . Cancer Father        Unknown  . Urolithiasis Son   . Tremor Sister   . Tremor Brother   . Macular degeneration Maternal Grandmother   . Alzheimer's disease Paternal Grandfather   . Breast cancer Neg Hx       Tobacco Use: Low Risk   . Smoking Tobacco Use: Never Smoker  . Smokeless Tobacco Use: Never Used   Social History   Substance and Sexual Activity  Alcohol Use Not  Currently   Comment: used to drink wine very seldom      Current Outpatient Medications:  .  Ascorbic Acid (VITAMIN C) POWD, Take 500 mg by mouth., Disp: , Rfl:  .  omeprazole (PRILOSEC) 20 MG capsule, TAKE 1 CAPSULE BY MOUTH EVERY DAY, Disp: 90 capsule, Rfl: 1 .  ondansetron (ZOFRAN) 4 MG tablet, Take 1 tablet (4 mg total) by mouth every 8 (eight) hours as needed for nausea or vomiting., Disp: 20 tablet, Rfl: 0 .  sodium chloride (OCEAN) 0.65 % nasal spray, Place 1 spray into both nostrils daily as needed (for allergies). , Disp: , Rfl:  .  TURMERIC CURCUMIN PO, Take 1 capsule by mouth daily. 900 mg daily, Disp: , Rfl:  .  albuterol (VENTOLIN HFA) 108 (90 Base) MCG/ACT inhaler, Inhale 2 puffs into the lungs every 6 (six) hours as needed for wheezing or shortness of breath. (Patient not taking: Reported on 09/21/2019), Disp: 18 g, Rfl: 0 .  ALPRAZolam (XANAX) 0.5 MG tablet, Take 1 tablet (0.5 mg total) by mouth daily as needed for anxiety (05-1 tablet). (Patient not taking: Reported on 08/29/2019), Disp: 30 tablet, Rfl: 0 .  aspirin-acetaminophen-caffeine (EXCEDRIN MIGRAINE) [id number] MG per tablet, Take 2 tablets by mouth every 6 (six) hours as needed for pain., Disp: , Rfl:  .  benzonatate (TESSALON) 100 MG capsule, Take 1-2 capsules (100-200 mg total) by mouth 2 (two) times daily as needed. (Patient not taking: Reported on 09/21/2019), Disp: 40 capsule, Rfl: 0 .  chlorpheniramine-HYDROcodone (TUSSIONEX PENNKINETIC ER) 10-8 MG/5ML SUER, Take 5 mLs by mouth at bedtime as needed. (Patient not taking: Reported on 09/21/2019), Disp: 140 mL, Rfl: 0 .  Cholecalciferol (VITAMIN D) 2000 units tablet, Take 2,000 Units by mouth daily., Disp: , Rfl:  .  Multiple Vitamins-Minerals (CENTRUM SILVER ADULT 50+ PO), Take 1 tablet by mouth daily., Disp: , Rfl:  .  umeclidinium-vilanterol (ANORO ELLIPTA) 62.5-25 MCG/INH AEPB, Inhale 1 puff into the lungs daily. (Patient not taking: Reported on 09/21/2019), Disp: 60  each, Rfl: 0  Allergies  Allergen Reactions  . Claritin  [Loratadine] Other (See Comments)  . Codeine Other (See Comments)    Severe headache   . Entex Lq  [Phenylephrine-Guaifenesin] Other (See Comments)  . Fexofenadine Other (See Comments)  . Loratadine-Pseudoephedrine Er Other (See Comments)  . Pseudoephedrine Other (See Comments)  . Tramadol Nausea Only    I personally reviewed active problem list, medication list, allergies, family history, social history, health maintenance with the patient/caregiver today.   ROS  Ten systems reviewed and is negative except as mentioned in HPI  Objective  Virtual encounter, vitals  Obtained at work  [place]   09/21/19 1042  BP: (!) 147/71  Temp: 97.7 F (36.5 C)  SpO2: 95%   There is no height or weight on file to calculate BMI.  Physical Exam  Awake, alert and oriented    PHQ2/9: Depression screen [facility] 2/9 09/21/2019 08/24/2019 04/13/2019 10/10/2018 02/09/2018  Decreased Interest 0 0 0 0 0  Down, Depressed, Hopeless 0 0 0 0 0  PHQ - 2 Score 0 0 0 0 0  Altered sleeping 0 0 0 0 -  Tired, decreased energy 0 0 0 0 -  Change in appetite 0 0 0 0 -  Feeling bad or failure about yourself  0 0 0 0 -  Trouble concentrating 0 0 0 0 -  Moving slowly or fidgety/restless 0 0 0 0 -  Suicidal thoughts 0 0 0 0 -  PHQ-9 Score 0 0 0 0 -  Difficult doing work/chores Not difficult at all Not difficult at all - - -   PHQ-2/9 Result is negative.    Fall Risk: Fall Risk  09/21/2019 08/24/2019 04/13/2019 10/10/2018 02/09/2018  Falls in the past year? 0 0 0 0 No  Number falls in past yr: 0 0 0 0 -  Comment - - - - -  Injury with Fall? 0 0 0 0 -  Follow up - - - - -    Assessment & Plan  1. Hypertension, benign  - atenolol (TENORMIN) 25 MG tablet; Take 0.5-1 tablets (12.5-25 mg total) by mouth every evening.  Dispense: 90 tablet; Refill: 0  2. Panic attacks  - ALPRAZolam (XANAX) 0.5 MG tablet; Take 1 tablet (0.5 mg total) by mouth  daily as needed for anxiety (05-1 tablet).  Dispense: 10 tablet; Refill: 0  3. Gastric reflux   4. History of COVID-19  Advised activated charcoal and back down on omeprazole to see if diarrhea improves   5. Metabolic syndrome  Resume physical activity   I discussed the assessment and treatment plan with the patient. The patient was provided an opportunity to ask questions and all were answered. The patient agreed with the plan and demonstrated an understanding of the instructions.  The patient was advised to call back or seek an in-person evaluation if the symptoms worsen or if the condition fails to improve as anticipated.  I provided 25 minutes of non-face-to-face time during this encounter.

## 2019-09-28 ENCOUNTER — Other Ambulatory Visit: Payer: Self-pay | Admitting: Family Medicine

## 2019-09-28 NOTE — Telephone Encounter (Signed)
Requested medications are on the active medication list no  Last refill 08/27/2019  Future visit scheduled yes March  Notes to clinic was discontinued after last refill.

## 2019-10-13 ENCOUNTER — Encounter: Payer: Self-pay | Admitting: Family Medicine

## 2019-10-13 ENCOUNTER — Other Ambulatory Visit: Payer: Self-pay

## 2019-10-13 ENCOUNTER — Ambulatory Visit (INDEPENDENT_AMBULATORY_CARE_PROVIDER_SITE_OTHER): Payer: BC Managed Care – PPO | Admitting: Family Medicine

## 2019-10-13 VITALS — BP 124/78 | HR 69 | Temp 97.1°F | Resp 16 | Ht 62.0 in | Wt 183.5 lb

## 2019-10-13 DIAGNOSIS — E8881 Metabolic syndrome: Secondary | ICD-10-CM | POA: Diagnosis not present

## 2019-10-13 DIAGNOSIS — E785 Hyperlipidemia, unspecified: Secondary | ICD-10-CM | POA: Diagnosis not present

## 2019-10-13 DIAGNOSIS — I10 Essential (primary) hypertension: Secondary | ICD-10-CM

## 2019-10-13 DIAGNOSIS — Z1231 Encounter for screening mammogram for malignant neoplasm of breast: Secondary | ICD-10-CM

## 2019-10-13 DIAGNOSIS — Z8673 Personal history of transient ischemic attack (TIA), and cerebral infarction without residual deficits: Secondary | ICD-10-CM

## 2019-10-13 DIAGNOSIS — G43409 Hemiplegic migraine, not intractable, without status migrainosus: Secondary | ICD-10-CM

## 2019-10-13 DIAGNOSIS — Z8616 Personal history of COVID-19: Secondary | ICD-10-CM

## 2019-10-13 DIAGNOSIS — Z0000 Encounter for general adult medical examination without abnormal findings: Secondary | ICD-10-CM

## 2019-10-13 DIAGNOSIS — M5416 Radiculopathy, lumbar region: Secondary | ICD-10-CM

## 2019-10-13 DIAGNOSIS — E559 Vitamin D deficiency, unspecified: Secondary | ICD-10-CM

## 2019-10-13 MED ORDER — ATENOLOL 25 MG PO TABS: 12.5000 mg | ORAL_TABLET | Freq: Every evening | ORAL | 1 refills | Status: DC

## 2019-10-13 NOTE — Progress Notes (Addendum)
Name: Monique Daniels   MRN: [id number]    DOB: [birth year]   Date:10/13/2019       Progress Note  Subjective  Chief Complaint  Chief Complaint  Patient presents with  . Well woman exam    HPI  Patient presents for annual CPE and follow up  Low back pain with radiculitis: symptoms started over one year ago. She had MRI2018she has been exercising and losing weight and is doing well, denies back pain at this time. She no longer takes flexeril. Episodes are getting more frequent, aggravated by running, radiates down right leg. She states she has to monitor her physical activity .   HTN: she was off Atenolol however when she had COVID bp started to go up again and she is back on medication daily, bp spiked last week during a episode of GERD but she is doing well now, bp is 120's/80's   GERD: under control with medication, no heartburn, no regurgitation since esophageal dilation 2016, she is taking Omeprazole prn, but had a severe episode last week and is back taking it daily again   Migraine: hemiplegic, she is no longer taking Topamaxbecause of tingling sensation,she was on Pamelor but stopped taking it months ago and no symptoms since. . Migraine episodes were  a dull like and sometimes a jaw pain like a toothache, associated withblurred vision, no nausea or vomiting, but at times has photophobia and phonophobia.She has been migraine free for a long time now. On Atenolol for bp   Metabolic Syndrome: denies polyphagia, polydipsia or polyuria.She has been going to the gym, no longer obese, eating healthier,  Had gone down from 5.7 %to normal range at 5.3 %   Panic attack: she states she has been feeling great and not panic attack in months, still has alprazolam at home .She will call when she needs a refill   Dyslipidemia and history of TIA: she does not want to take statin therapy, discussed aspirin, but she takes excedrin and has GERD therefore risk of bleeding. Reviewed  last labs and improved.    Diet: she likes fruit, vegetables, advised enough calcium in her diet.  Exercise: going back to the gym, she stopped because of COVID but is back now three days a week and will increase to 5 days a week next week   USPSTF grade A and B recommendations    Office Visit from 09/21/2019 in [facility]  AUDIT-C Score  0     Depression: Phq 9 is  negative Depression screen [facility] 2/9 10/13/2019 09/21/2019 08/24/2019 04/13/2019 10/10/2018  Decreased Interest 0 0 0 0 0  Down, Depressed, Hopeless 0 0 0 0 0  PHQ - 2 Score 0 0 0 0 0  Altered sleeping 0 0 0 0 0  Tired, decreased energy 0 0 0 0 0  Change in appetite 0 0 0 0 0  Feeling bad or failure about yourself  0 0 0 0 0  Trouble concentrating 0 0 0 0 0  Moving slowly or fidgety/restless 0 0 0 0 0  Suicidal thoughts 0 0 0 0 0  PHQ-9 Score 0 0 0 0 0  Difficult doing work/chores Not difficult at all Not difficult at all Not difficult at all - -   Hypertension: BP Readings from Last 3 Encounters:  10/13/19 124/78  09/21/19 (!) 147/71  08/29/19 130/71   Obesity: Wt Readings from Last 3 Encounters:  10/13/19 183 lb 8 oz (83.2 kg)  08/29/19 176 lb  3.2 oz (79.9 kg)  08/24/19 170 lb 3.2 oz (77.2 kg)   BMI Readings from Last 3 Encounters:  10/13/19 33.56 kg/m  08/29/19 32.23 kg/m  08/24/19 31.13 kg/m     Hep C Screening: 2018  STD testing and prevention (HIV/chl/gon/syphilis): Not interested  Intimate partner violence: negative screen  Sexual History (Partners/Practices/Protection from [employer] hx STI/Pregnancy Plans): N/A Pain during Intercourse: sometimes, from dryness  Menstrual History/LMP/Abnormal Bleeding: discussed post menopausal bleeding  Incontinence Symptoms: no symptoms   Breast cancer:  - Last Mammogram: 2019  - BRCA gene screening: N/A  Osteoporosis: Discussed high calcium and vitamin D supplementation, weight bearing exercises  Cervical cancer screening: she asked to  post-pone to 5 years, low risk and normal pap , negative HPV in 2017   Skin cancer: Discussed monitoring for atypical lesions  Colorectal cancer: done in 2015, she received a letter from GI and will contact them back, unable to see report, done at [facility] Lung cancer:  Low Dose CT Chest recommended if Age 28-80 years, 30 pack-year currently smoking OR have quit w/in 15years. Patient does not qualify.   ECG: 2018   Advanced Care Planning: A voluntary discussion about advance care planning including the explanation and discussion of advance directives.  Discussed health care proxy and Living will, and the patient was able to identify a health care proxy as [facility] .  Patient does not have a living will at present time.  Lipids: Lab Results  Component Value Date   CHOL 178 04/17/2019   CHOL 192 11/30/2017   CHOL 182 11/30/2016   Lab Results  Component Value Date   HDL 59 04/17/2019   HDL 42 (L) 11/30/2017   HDL 54 11/30/2016   Lab Results  Component Value Date   LDLCALC 105 (H) 04/17/2019   LDLCALC 130 (H) 11/30/2017   LDLCALC 116 (H) 11/30/2016   Lab Results  Component Value Date   TRIG 57 04/17/2019   TRIG 94 11/30/2017   TRIG 59 11/30/2016   Lab Results  Component Value Date   CHOLHDL 3.0 04/17/2019   CHOLHDL 4.6 11/30/2017   CHOLHDL 3.4 11/30/2016   No results found for: LDLDIRECT  Glucose: Glucose  Date Value Ref Range Status  01/18/2013 88 65 - 99 mg/dL Final  01/12/2013 159 (H) 65 - 99 mg/dL Final  07/06/2012 96 65 - 99 mg/dL Final   Glucose, Bld  Date Value Ref Range Status  04/17/2019 87 65 - 99 mg/dL Final    Comment:    .            Fasting reference interval .   08/24/2017 94 65 - 99 mg/dL Final    Comment:    .            Fasting reference interval .   07/06/2017 87 65 - 99 mg/dL Final   Glucose-Capillary  Date Value Ref Range Status  05/05/2013 80 70 - 99 mg/dL Final    Patient Active Problem List   Diagnosis Date Noted   . Metabolic syndrome 11/91/4782  . Cervical spinal stenosis 04/08/2016  . Prediabetes 01/09/2016  . Dyslipidemia 10/03/2015  . Seasonal allergic rhinitis 04/29/2015  . History of esophageal stricture 01/28/2015  . Atypical nevus 01/27/2015  . Carpal tunnel syndrome 01/27/2015  . Insomnia, persistent 01/27/2015  . DDD (degenerative disc disease), cervical 01/27/2015  . Panic attacks 01/27/2015  . Gastric reflux 01/27/2015  . Headache, hemiplegic migraine 01/27/2015  . H/O transient cerebral ischemia 01/27/2015  .  Hypertension, benign 01/27/2015  . Calculus of kidney 01/27/2015  . Adult BMI 30+ 01/27/2015  . Vitamin D deficiency 01/27/2015  . Snores 01/27/2015    Past Surgical History:  Procedure Laterality Date  . HYSTEROSCOPY WITH D & C N/A 02/07/2015   Procedure: DILATATION AND CURETTAGE /HYSTEROSCOPY;  Surgeon: Will Bonnet, MD;  Location: [facility] ORS;  Service: Gynecology;  Laterality: N/A;  . HYSTEROSCOPY WITH NOVASURE N/A 02/07/2015   Procedure: HYSTEROSCOPY WITH NOVASURE;  Surgeon: Will Bonnet, MD;  Location: [facility] ORS;  Service: Gynecology;  Laterality: N/A;  . TOOTH EXTRACTION    . TUBAL LIGATION  1989    Family History  Problem Relation Age of Onset  . Asthma Mother   . Heart disease Mother   . Hyperlipidemia Mother   . Emphysema Mother   . Tremor Mother   . Macular degeneration Mother   . Asthma Son   . Heart attack Father   . Cancer Father        Unknown  . Urolithiasis Son   . Tremor Sister   . Tremor Brother   . Macular degeneration Maternal Grandmother   . Alzheimer's disease Paternal Grandfather   . Breast cancer Neg Hx     Social History   Socioeconomic History  . Marital status: Married    Spouse name: Merry Proud  . Number of children: 2  . Years of education: 12th  . Highest education level: Not on file  Occupational History  . Occupation: Medical records    Employer: [employer]  Tobacco Use  . Smoking status: Never  Smoker  . Smokeless tobacco: Never Used  Substance and Sexual Activity  . Alcohol use: Not Currently    Comment: used to drink wine very seldom   . Drug use: No  . Sexual activity: Yes    Partners: Male    Comment: BTL  Other Topics Concern  . Not on file  Social History Narrative   Patient lives at home with husband Merry Proud.    Patient works at [employer].    Patient has a high school education.    Patient has 2 children.    Social Determinants of Health   Financial Resource Strain: Low Risk   . Difficulty of Paying Living Expenses: Not hard at all  Food Insecurity: No Food Insecurity  . Worried About Charity fundraiser in the Last Year: Never true  . Ran Out of Food in the Last Year: Never true  Transportation Needs: No Transportation Needs  . Lack of Transportation (Medical): No  . Lack of Transportation (Non-Medical): No  Physical Activity: Inactive  . Days of Exercise per Week: 0 days  . Minutes of Exercise per Session: 0 min  Stress: No Stress Concern Present  . Feeling of Stress : Not at all  Social Connections: Not Isolated  . Frequency of Communication with Friends and Family: More than three times a week  . Frequency of Social Gatherings with Friends and Family: More than three times a week  . Attends Religious Services: More than 4 times per year  . Active Member of Clubs or Organizations: Yes  . Attends Archivist Meetings: More than 4 times per year  . Marital Status: Married  Human resources officer Violence: Not At Risk  . Fear of Current or Ex-Partner: No  . Emotionally Abused: No  . Physically Abused: No  . Sexually Abused: No     Current Outpatient Medications:  .  ALPRAZolam (  XANAX) 0.5 MG tablet, Take 1 tablet (0.5 mg total) by mouth daily as needed for anxiety (05-1 tablet)., Disp: 10 tablet, Rfl: 0 .  Ascorbic Acid (VITAMIN C) POWD, Take 500 mg by mouth., Disp: , Rfl:  .  aspirin-acetaminophen-caffeine (EXCEDRIN MIGRAINE) 250-250-65  MG per tablet, Take 2 tablets by mouth every 6 (six) hours as needed for pain., Disp: , Rfl:  .  atenolol (TENORMIN) 25 MG tablet, Take 0.5-1 tablets (12.5-25 mg total) by mouth every evening., Disp: 90 tablet, Rfl: 1 .  Cholecalciferol (VITAMIN D) 2000 units tablet, Take 2,000 Units by mouth daily., Disp: , Rfl:  .  Multiple Vitamins-Minerals (CENTRUM SILVER ADULT 50+ PO), Take 1 tablet by mouth daily., Disp: , Rfl:  .  omeprazole (PRILOSEC) 20 MG capsule, TAKE 1 CAPSULE BY MOUTH EVERY DAY, Disp: 90 capsule, Rfl: 1 .  sodium chloride (OCEAN) 0.65 % nasal spray, Place 1 spray into both nostrils daily as needed (for allergies). , Disp: , Rfl:  .  TURMERIC CURCUMIN PO, Take 1 capsule by mouth daily. 900 mg daily, Disp: , Rfl:   Allergies  Allergen Reactions  . Claritin  [Loratadine] Other (See Comments)  . Codeine Other (See Comments)    Severe headache   . Entex Lq  [Phenylephrine-Guaifenesin] Other (See Comments)  . Fexofenadine Other (See Comments)  . Loratadine-Pseudoephedrine Er Other (See Comments)  . Pseudoephedrine Other (See Comments)  . Tramadol Nausea Only     ROS  Constitutional: Negative for fever or weight change.  Respiratory: Negative for cough and shortness of breath.   Cardiovascular: Negative for chest pain or palpitations.  Gastrointestinal: Negative for abdominal pain, no bowel changes.  Musculoskeletal: Negative for gait problem or joint swelling.  Skin: Negative for rash.  Neurological: Negative for dizziness or headache.  No other specific complaints in a complete review of systems (except as listed in HPI above).  Objective  Vitals:   10/13/19 0949  BP: 124/78  Pulse: 69  Resp: 16  Temp: (!) 97.1 F (36.2 C)  TempSrc: Temporal  SpO2: 98%  Weight: 183 lb 8 oz (83.2 kg)  Height: _0  (1.575 m)    Body mass index is 33.56 kg/m.  Physical Exam  Constitutional: Patient appears well-developed and  Obese. No distress.  HENT: Head: Normocephalic  and atraumatic. Ears: B TMs ok, no erythema or effusion; Nose: not done  Mouth/Throat: not done Eyes: Conjunctivae and EOM are normal. Pupils are equal, round, and reactive to light. No scleral icterus.  Neck: Normal range of motion. Neck supple. No JVD present. No thyromegaly present.  Cardiovascular: Normal rate, regular rhythm and normal heart sounds.  No murmur heard. No BLE edema. Pulmonary/Chest: Effort normal and breath sounds normal. No respiratory distress. Abdominal: Soft. Bowel sounds are normal, no distension. There is no tenderness. no masses Breast: no lumps or masses, no nipple discharge or rashes FEMALE GENITALIA:  Not done  RECTAL: not done  Musculoskeletal: Normal range of motion, no joint effusions. No gross deformities Neurological: he is alert and oriented to person, place, and time. No cranial nerve deficit. Coordination, balance, strength, speech and gait are normal.  Skin: Skin is warm and dry. No rash noted. No erythema.  Psychiatric: Patient has a normal mood and affect. behavior is normal. Judgment and thought content normal.  Recent Results (from the past 2160 hour(s))  CBC With Differential     Status: None   Collection Time: 08/29/19 12:00 AM  Result Value Ref Range   WBC  8.3 3.4 - 10.8 x10E3/uL   RBC 4.87 3.77 - 5.28 x10E6/uL   Hemoglobin 14.5 11.1 - 15.9 g/dL   Hematocrit 42.9 34.0 - 46.6 %   MCV 88 79 - 97 fL   MCH 29.8 26.6 - 33.0 pg   MCHC 33.8 31.5 - 35.7 g/dL   RDW 12.8 11.7 - 15.4 %   Neutrophils 58 Not Estab. %   Lymphs 27 Not Estab. %   Monocytes 8 Not Estab. %   Eos 5 Not Estab. %   Basos 1 Not Estab. %   Neutrophils Absolute 4.8 1.4 - 7.0 x10E3/uL   Lymphocytes Absolute 2.2 0.7 - 3.1 x10E3/uL   Monocytes Absolute 0.7 0.1 - 0.9 x10E3/uL   EOS (ABSOLUTE) 0.4 0.0 - 0.4 x10E3/uL   Basophils Absolute 0.1 0.0 - 0.2 x10E3/uL   Immature Granulocytes 1 Not Estab. %   Immature Grans (Abs) 0.1 0.0 - 0.1 x10E3/uL     Fall Risk: Fall Risk   10/13/2019 09/21/2019 08/24/2019 04/13/2019 10/10/2018  Falls in the past year? 0 0 0 0 0  Number falls in past yr: 0 0 0 0 0  Comment - - - - -  Injury with Fall? 0 0 0 0 0  Follow up - - - - -     Functional Status Survey: Is the patient deaf or have difficulty hearing?: No Does the patient have difficulty seeing, even when wearing glasses/contacts?: Yes Does the patient have difficulty concentrating, remembering, or making decisions?: No Does the patient have difficulty walking or climbing stairs?: No Does the patient have difficulty dressing or bathing?: No Does the patient have difficulty doing errands alone such as visiting a doctor's office or shopping?: No   Assessment & Plan  1. Well adult exam   2. Encounter for screening mammogram for malignant neoplasm of breast  She will call and scheduled it   3. Dyslipidemia  - Lipid panel  4. Metabolic syndrome  - Hemoglobin A1c  5. Hypertension, benign  - atenolol (TENORMIN) 25 MG tablet; Take 0.5-1 tablets (12.5-25 mg total) by mouth every evening.  Dispense: 90 tablet; Refill: 1 - COMPLETE METABOLIC PANEL WITH GFR - CBC with Differential/Platelet  6. Vitamin D deficiency  Continue Vitamin D 2000 units daily   7. Hemiplegic migraine without status migrainosus, not intractable  - atenolol (TENORMIN) 25 MG tablet; Take 0.5-1 tablets (12.5-25 mg total) by mouth every evening.  Dispense: 90 tablet; Refill: 1  8. History of TIA (transient ischemic attack)   9. Right lumbar radiculitis  Doing better, intermittent symptoms now, avoiding running   10. History of COVID-19  Still a little tired but gradually improving   -USPSTF grade A and B recommendations reviewed with patient; age-appropriate recommendations, preventive care, screening tests, etc discussed and encouraged; healthy living encouraged; see AVS for patient education given to patient -Discussed importance of 150 minutes of physical activity weekly, eat two  servings of fish weekly, eat one serving of tree nuts ( cashews, pistachios, pecans, almonds.Marland Kitchen) every other day, eat 6 servings of fruit/vegetables daily and drink plenty of water and avoid sweet beverages.

## 2019-10-13 NOTE — Patient Instructions (Signed)

## 2019-10-14 LAB — COMPLETE METABOLIC PANEL WITH GFR
AG Ratio: 1.4 (calc) (ref 1.0–2.5)
ALT: 15 U/L (ref 6–29)
AST: 19 U/L (ref 10–35)
Albumin: 3.9 g/dL (ref 3.6–5.1)
Alkaline phosphatase (APISO): 57 U/L (ref 37–153)
BUN: 18 mg/dL (ref 7–25)
CO2: 26 mmol/L (ref 20–32)
Calcium: 9.3 mg/dL (ref 8.6–10.4)
Chloride: 106 mmol/L (ref 98–110)
Creat: 0.68 mg/dL (ref 0.50–1.05)
GFR, Est African American: 113 mL/min/{1.73_m2} (ref >=60)
GFR, Est Non African American: 97 mL/min/{1.73_m2} (ref >=60)
Globulin: 2.7 g/dL (calc) (ref 1.9–3.7)
Glucose, Bld: 77 mg/dL (ref 65–99)
Potassium: 4.1 mmol/L (ref 3.5–5.3)
Sodium: 140 mmol/L (ref 135–146)
Total Bilirubin: 0.5 mg/dL (ref 0.2–1.2)
Total Protein: 6.6 g/dL (ref 6.1–8.1)

## 2019-10-14 LAB — CBC WITH DIFFERENTIAL/PLATELET
Absolute Monocytes: 491 cells/uL (ref 200–950)
Basophils Absolute: 80 cells/uL (ref 0–200)
Basophils Relative: 1.9 %
Eosinophils Absolute: 441 cells/uL (ref 15–500)
Eosinophils Relative: 10.5 %
HCT: 40.6 % (ref 35.0–45.0)
Hemoglobin: 13.4 g/dL (ref 11.7–15.5)
Lymphs Abs: 1651 cells/uL (ref 850–3900)
MCH: 29.3 pg (ref 27.0–33.0)
MCHC: 33 g/dL (ref 32.0–36.0)
MCV: 88.8 fL (ref 80.0–100.0)
MPV: 10.9 fL (ref 7.5–12.5)
Monocytes Relative: 11.7 %
Neutro Abs: 1537 cells/uL (ref 1500–7800)
Neutrophils Relative %: 36.6 %
Platelets: 234 10*3/uL (ref 140–400)
RBC: 4.57 10*6/uL (ref 3.80–5.10)
RDW: 13.4 % (ref 11.0–15.0)
Total Lymphocyte: 39.3 %
WBC: 4.2 10*3/uL (ref 3.8–10.8)

## 2019-10-14 LAB — LIPID PANEL
Cholesterol: 198 mg/dL (ref <200)
HDL: 59 mg/dL (ref >=50)
LDL Cholesterol (Calc): 125 mg/dL (calc) — ABNORMAL HIGH
Non-HDL Cholesterol (Calc): 139 mg/dL (calc) — ABNORMAL HIGH (ref <130)
Total CHOL/HDL Ratio: 3.4 (calc) (ref <5.0)
Triglycerides: 51 mg/dL (ref <150)

## 2019-10-14 LAB — HEMOGLOBIN A1C
Hgb A1c MFr Bld: 5.3 % of total Hgb (ref <5.7)
Mean Plasma Glucose: 105 (calc)
eAG (mmol/L): 5.8 (calc)

## 2019-10-21 ENCOUNTER — Other Ambulatory Visit: Payer: Self-pay | Admitting: Family Medicine

## 2019-10-21 DIAGNOSIS — K219 Gastro-esophageal reflux disease without esophagitis: Secondary | ICD-10-CM

## 2019-11-22 ENCOUNTER — Ambulatory Visit
Admission: RE | Admit: 2019-11-22 | Discharge: 2019-11-22 | Disposition: A | Discharge status: Home / Self Care | Payer: BC Managed Care – PPO | Source: Ambulatory Visit | Attending: Family Medicine | Admitting: Family Medicine

## 2019-11-22 DIAGNOSIS — Z1231 Encounter for screening mammogram for malignant neoplasm of breast: Secondary | ICD-10-CM | POA: Insufficient documentation

## 2019-11-22 DIAGNOSIS — Z1239 Encounter for other screening for malignant neoplasm of breast: Secondary | ICD-10-CM

## 2020-04-09 NOTE — Progress Notes (Signed)
Name: Monique Daniels   MRN: [id number]    DOB: 1960/08/05   Date:04/10/2020       Progress Note  Subjective  Chief Complaint  Medication Refill/Eosinophils 7.6 and CO2 high as well  Husband diagnosed with COVID-19 04/02/2020, he is feeling better but she is in quarantine , she has been vaccinated, husband was not vaccinated   HPI    Low back pain with radiculitis: symptoms started over one year ago. She had MRI2018she has been exercising and losing weight and is doing well, denies back pain at this time. She no longer takes flexeril. She has noticed more buttocks and hamstring pain, she discussed with PT and some exercises has improved. She thinks she strained it while at the gym.   HTN: she was off Atenolol however when she had COVID bp started to go up again and she is back on medication daily, bp was elevated at home today, but she rechecked it and it went down to 134/74   GERD: under control with medication, no heartburn, no regurgitation since esophageal dilation 2016, she is taking Omeprazole and is doing well, change in her diet has improved symptoms   Migraine: hemiplegic, she is no longer taking Topamaxbecause of tingling sensation,she was on Pamelor but stopped taking it months ago and no symptoms since.  Migraine episodes were  a dull like and sometimes a jaw pain like a toothache, associated withblurred vision, no nausea or vomiting, but at times has photophobia and phonophobia.She had one episode last month she took otc medication , symptoms lasted days, she had photophobia and phonophobia . On Atenolol for bp and migraine prevention   Metabolic Syndrome: denies polyphagia, polydipsia or polyuria.She has been going to the gym ( not recently because husband has been sick, but will resume it this Sunday) , no longer obese, eating healthier. Had gone down from 5.7 % to normal range at 5.3 %   Panic attack: she states she has been feeling great and not panic attack in  months, last refill alprazolam refill was 09/2019, she does not want a refill today   Dyslipidemia and history of TIA: she does not want to take statin therapy, discussed aspirin but she forgets to take it. Last LDL was 125 .   Elevated eosinophils: explained usually related to allergies  Post-covid SOB : she still has difficulty breathing when she is physically active or when in the heat, has to use albuterol prior to activity, still not back to baseline . She denies chest pain or palpitation   Patient Active Problem List   Diagnosis Date Noted   Metabolic syndrome 40/98/1191   Cervical spinal stenosis 04/08/2016   Prediabetes 01/09/2016   Dyslipidemia 10/03/2015   Seasonal allergic rhinitis 04/29/2015   History of esophageal stricture 01/28/2015   Atypical nevus 01/27/2015   Carpal tunnel syndrome 01/27/2015   Insomnia, persistent 01/27/2015   DDD (degenerative disc disease), cervical 01/27/2015   Panic attacks 01/27/2015   Gastric reflux 01/27/2015   Headache, hemiplegic migraine 01/27/2015   H/O transient cerebral ischemia 01/27/2015   Hypertension, benign 01/27/2015   Calculus of kidney 01/27/2015   Adult BMI 30+ 01/27/2015   Vitamin D deficiency 01/27/2015   Snores 01/27/2015    Past Surgical History:  Procedure Laterality Date   HYSTEROSCOPY WITH D & C N/A 02/07/2015   Procedure: DILATATION AND CURETTAGE /HYSTEROSCOPY;  Surgeon: Will Bonnet, MD;  Location: [facility];  Service: Gynecology;  Laterality: N/A;   HYSTEROSCOPY WITH NOVASURE N/A  02/07/2015   Procedure: HYSTEROSCOPY WITH NOVASURE;  Surgeon: Will Bonnet, MD;  Location: [facility];  Service: Gynecology;  Laterality: N/A;   TOOTH EXTRACTION     TUBAL LIGATION  1989    Family History  Problem Relation Age of Onset   Asthma Mother    Heart disease Mother    Hyperlipidemia Mother    Emphysema Mother    Tremor Mother    Macular degeneration Mother    Asthma Son    Heart  attack Father    Cancer Father        Unknown   Urolithiasis Son    Tremor Sister    Tremor Brother    Macular degeneration Maternal Grandmother    Alzheimer's disease Paternal Grandfather    Breast cancer Neg Hx     Social History   Tobacco Use   Smoking status: Never Smoker   Smokeless tobacco: Never Used  Substance Use Topics   Alcohol use: Not Currently    Comment: used to drink wine very seldom      Current Outpatient Medications:    albuterol (VENTOLIN HFA) 108 (90 Base) MCG/ACT inhaler, TAKE 2 PUFFS BY MOUTH EVERY 6 HOURS AS NEEDED FOR WHEEZE OR SHORTNESS OF BREATH, Disp: , Rfl:    ALPRAZolam (XANAX) 0.5 MG tablet, Take 1 tablet (0.5 mg total) by mouth daily as needed for anxiety (05-1 tablet)., Disp: 10 tablet, Rfl: 0   Ascorbic Acid (VITAMIN C) POWD, Take 500 mg by mouth., Disp: , Rfl:    aspirin-acetaminophen-caffeine (EXCEDRIN MIGRAINE) 250-037-04 MG per tablet, Take 2 tablets by mouth every 6 (six) hours as needed for pain., Disp: , Rfl:    Cholecalciferol (VITAMIN D) 2000 units tablet, Take 2,000 Units by mouth daily., Disp: , Rfl:    estradiol (VIVELLE-DOT) 0.075 MG/24HR, Place onto the skin., Disp: , Rfl:    Multiple Vitamins-Minerals (CENTRUM SILVER ADULT 50+ PO), Take 1 tablet by mouth daily., Disp: , Rfl:    omeprazole (PRILOSEC) 20 MG capsule, TAKE 1 CAPSULE BY MOUTH EVERY DAY, Disp: 90 capsule, Rfl: 3   progesterone (PROMETRIUM) 200 MG capsule, Take by mouth., Disp: , Rfl:    sodium chloride (OCEAN) 0.65 % nasal spray, Place 1 spray into both nostrils daily as needed (for allergies). , Disp: , Rfl:    TURMERIC CURCUMIN PO, Take 1 capsule by mouth daily. 900 mg daily, Disp: , Rfl:    atenolol (TENORMIN) 25 MG tablet, Take 0.5-1 tablets (12.5-25 mg total) by mouth every evening. (Patient not taking: Reported on 04/10/2020), Disp: 90 tablet, Rfl: 1  Allergies  Allergen Reactions   Claritin  [Loratadine] Other (See Comments)   Codeine Other  (See Comments)    Severe headache    Entex Lq  [Phenylephrine-Guaifenesin] Other (See Comments)   Fexofenadine Other (See Comments)   Loratadine-Pseudoephedrine Er Other (See Comments)   Pseudoephedrine Other (See Comments)   Tramadol Nausea Only    I personally reviewed active problem list, medication list, allergies, family history, social history, health maintenance with the patient/caregiver today.   ROS  Ten systems reviewed and is negative except as mentioned in HPI  Objective  Vitals:   04/10/20 1108 04/10/20 1328  BP: (!) 152/78 134/74  Pulse: (!) 57   SpO2: 98%   Weight: 183 lb 12.8 oz (83.4 kg)   Height: 5\' 2"  (1.575 m)     Body mass index is 33.62 kg/m.  Physical Exam  Awake, alert and oriented   PHQ2/9: Depression screen  [facility] 2/9 04/10/2020 10/13/2019 09/21/2019 08/24/2019 04/13/2019  Decreased Interest 0 0 0 0 0  Down, Depressed, Hopeless 0 0 0 0 0  PHQ - 2 Score 0 0 0 0 0  Altered sleeping - 0 0 0 0  Tired, decreased energy - 0 0 0 0  Change in appetite - 0 0 0 0  Feeling bad or failure about yourself  - 0 0 0 0  Trouble concentrating - 0 0 0 0  Moving slowly or fidgety/restless - 0 0 0 0  Suicidal thoughts - 0 0 0 0  PHQ-9 Score - 0 0 0 0  Difficult doing work/chores - Not difficult at all Not difficult at all Not difficult at all -    phq 9 is negative   Fall Risk: Fall Risk  04/10/2020 10/13/2019 09/21/2019 08/24/2019 04/13/2019  Falls in the past year? 0 0 0 0 0  Number falls in past yr: 0 0 0 0 0  Comment - - - - -  Injury with Fall? 0 0 0 0 0  Follow up - - - - -     Functional Status Survey: Is the patient deaf or have difficulty hearing?: No Does the patient have difficulty seeing, even when wearing glasses/contacts?: No Does the patient have difficulty concentrating, remembering, or making decisions?: No Does the patient have difficulty walking or climbing stairs?: No Does the patient have difficulty dressing or bathing?: No Does the  patient have difficulty doing errands alone such as visiting a doctor's office or shopping?: No    Assessment & Plan  1. SOB (shortness of breath) on exertion  Advised referral to pulmonologist but she would like to hold off for now and try inhaler, she will let me know if she changes her mind  - albuterol (VENTOLIN HFA) 108 (90 Base) MCG/ACT inhaler; TAKE 2 PUFFS BY MOUTH EVERY 6 HOURS AS NEEDED FOR WHEEZE OR SHORTNESS OF BREATH  Dispense: 18 g; Refill: 1  2. History of COVID-19  Likely the cause of SOB  3. Dyslipidemia  Refuses statin therapy   4. History of TIA (transient ischemic attack)  Not on statins or aspirin by choice  5. Hypertension, benign  At goal, continue Atenolol   6. Metabolic syndrome  On life style modification   7. Hemiplegic migraine without status migrainosus, not intractable  Episodes not as frequent   8. Vitamin D deficiency  Take otc supplementation   9. Right lumbar radiculitis  Doing some home PT   10. Gastric reflux  Doing well   11. Post menopausal syndrome  Medication given by Dr. Shary Decamp  - estradiol (VIVELLE-DOT) 0.075 MG/24HR; Place onto the skin. - progesterone (PROMETRIUM) 200 MG capsule; Take by mouth.

## 2020-04-10 ENCOUNTER — Ambulatory Visit (INDEPENDENT_AMBULATORY_CARE_PROVIDER_SITE_OTHER): Payer: BC Managed Care – PPO | Admitting: Family Medicine

## 2020-04-10 ENCOUNTER — Other Ambulatory Visit: Payer: Self-pay

## 2020-04-10 ENCOUNTER — Encounter: Payer: Self-pay | Admitting: Family Medicine

## 2020-04-10 VITALS — BP 134/74 | HR 57 | Ht 62.0 in | Wt 183.8 lb

## 2020-04-10 DIAGNOSIS — R0602 Shortness of breath: Secondary | ICD-10-CM

## 2020-04-10 DIAGNOSIS — I729 Aneurysm of unspecified site: Secondary | ICD-10-CM | POA: Insufficient documentation

## 2020-04-10 DIAGNOSIS — N951 Menopausal and female climacteric states: Secondary | ICD-10-CM

## 2020-04-10 DIAGNOSIS — E8881 Metabolic syndrome: Secondary | ICD-10-CM

## 2020-04-10 DIAGNOSIS — G43409 Hemiplegic migraine, not intractable, without status migrainosus: Secondary | ICD-10-CM

## 2020-04-10 DIAGNOSIS — Z8616 Personal history of COVID-19: Secondary | ICD-10-CM

## 2020-04-10 DIAGNOSIS — M5416 Radiculopathy, lumbar region: Secondary | ICD-10-CM

## 2020-04-10 DIAGNOSIS — E785 Hyperlipidemia, unspecified: Secondary | ICD-10-CM

## 2020-04-10 DIAGNOSIS — K219 Gastro-esophageal reflux disease without esophagitis: Secondary | ICD-10-CM

## 2020-04-10 DIAGNOSIS — Z8673 Personal history of transient ischemic attack (TIA), and cerebral infarction without residual deficits: Secondary | ICD-10-CM

## 2020-04-10 DIAGNOSIS — I10 Essential (primary) hypertension: Secondary | ICD-10-CM

## 2020-04-10 DIAGNOSIS — E559 Vitamin D deficiency, unspecified: Secondary | ICD-10-CM

## 2020-04-10 MED ORDER — ALBUTEROL SULFATE HFA 108 (90 BASE) MCG/ACT IN AERS: INHALATION_SPRAY | RESPIRATORY_TRACT | 1 refills | Status: DC

## 2020-06-04 ENCOUNTER — Other Ambulatory Visit: Payer: Self-pay | Admitting: Family Medicine

## 2020-06-04 DIAGNOSIS — R0602 Shortness of breath: Secondary | ICD-10-CM

## 2020-10-29 ENCOUNTER — Encounter: Payer: Self-pay | Admitting: Family Medicine

## 2020-10-29 ENCOUNTER — Telehealth (INDEPENDENT_AMBULATORY_CARE_PROVIDER_SITE_OTHER): Payer: No Typology Code available for payment source | Admitting: Family Medicine

## 2020-10-29 DIAGNOSIS — R0602 Shortness of breath: Secondary | ICD-10-CM

## 2020-10-29 DIAGNOSIS — R5383 Other fatigue: Secondary | ICD-10-CM

## 2020-10-29 DIAGNOSIS — R059 Cough, unspecified: Secondary | ICD-10-CM

## 2020-10-29 MED ORDER — TRELEGY ELLIPTA 100-62.5-25 MCG/INH IN AEPB: 1.0000 | INHALATION_SPRAY | Freq: Every day | RESPIRATORY_TRACT | 1 refills | Status: DC

## 2020-10-29 MED ORDER — BENZONATATE 100 MG PO CAPS: 100.0000 mg | ORAL_CAPSULE | Freq: Two times a day (BID) | ORAL | 0 refills | Status: DC | PRN

## 2020-10-29 NOTE — Progress Notes (Signed)
Name: Monique Daniels   MRN: [id number]    DOB: [birth year]   Date:10/29/2020       Progress Note  Subjective  Chief Complaint  Chief Complaint  Patient presents with  . URI    I connected with  Virgilio Belling  on 10/29/20 at 10:20 AM EDT by a video enabled telemedicine application and verified that I am speaking with the correct person using two identifiers.  I discussed the limitations of evaluation and management by telemedicine and the availability of in person appointments. The patient expressed understanding and agreed to proceed with the virtual visit  Staff also discussed with the patient that there may be a patient responsible charge related to this service. Patient Location: at home  Provider Location: [facility]  Additional Individuals present: alone   HPI  URI: she stated started Saturday initially with fatigue, followed by nasal congestion, hoarseness, initially a dry cough, but since yesterday she has noticed hoarseness, sore throat, productive cough ( yellow sputum) and also SOB with activity. She was at work and had to use her rescue inhaler. She stayed at home yesterday because she was feeling so sick, tried to go to work today, they did the rapid COVID-19 test that was negative but sent home because she looked pale. She states worse symptoms is fatigue.   She had COVID 2019 , she has a remote history of pneumonia   She had two doses of COVID-19- Moderma,  but not the booster.   Patient Active Problem List   Diagnosis Date Noted  . Aneurysm ([place]) 04/10/2020  . Metabolic syndrome 93/26/7124  . Cervical spinal stenosis 04/08/2016  . Prediabetes 01/09/2016  . Dyslipidemia 10/03/2015  . Seasonal allergic rhinitis 04/29/2015  . History of esophageal stricture 01/28/2015  . Atypical nevus 01/27/2015  . Carpal tunnel syndrome 01/27/2015  . Insomnia, persistent 01/27/2015  . DDD (degenerative disc disease), cervical 01/27/2015  . Panic attacks 01/27/2015  . Gastric  reflux 01/27/2015  . Headache, hemiplegic migraine 01/27/2015  . H/O transient cerebral ischemia 01/27/2015  . Hypertension, benign 01/27/2015  . Calculus of kidney 01/27/2015  . Adult BMI 30+ 01/27/2015  . Vitamin D deficiency 01/27/2015  . Snores 01/27/2015    Past Surgical History:  Procedure Laterality Date  . HYSTEROSCOPY WITH D & C N/A 02/07/2015   Procedure: DILATATION AND CURETTAGE /HYSTEROSCOPY;  Surgeon: Will Bonnet, MD;  Location: [facility];  Service: Gynecology;  Laterality: N/A;  . HYSTEROSCOPY WITH NOVASURE N/A 02/07/2015   Procedure: HYSTEROSCOPY WITH NOVASURE;  Surgeon: Will Bonnet, MD;  Location: [facility];  Service: Gynecology;  Laterality: N/A;  . TOOTH EXTRACTION    . TUBAL LIGATION  1989    Family History  Problem Relation Age of Onset  . Asthma Mother   . Heart disease Mother   . Hyperlipidemia Mother   . Emphysema Mother   . Tremor Mother   . Macular degeneration Mother   . Asthma Son   . Heart attack Father   . Cancer Father        Unknown  . Urolithiasis Son   . Tremor Sister   . Tremor Brother   . Macular degeneration Maternal Grandmother   . Alzheimer's disease Paternal Grandfather   . Breast cancer Neg Hx       Current Outpatient Medications:  .  albuterol (VENTOLIN HFA) 108 (90 Base) MCG/ACT inhaler, TAKE 2 PUFFS BY MOUTH EVERY 6 HOURS AS NEEDED FOR WHEEZE OR SHORTNESS OF BREATH, Disp:  18 g, Rfl: 1 .  ALPRAZolam (XANAX) 0.5 MG tablet, Take 1 tablet (0.5 mg total) by mouth daily as needed for anxiety (05-1 tablet)., Disp: 10 tablet, Rfl: 0 .  Ascorbic Acid (VITAMIN C) POWD, Take 500 mg by mouth., Disp: , Rfl:  .  aspirin-acetaminophen-caffeine (EXCEDRIN MIGRAINE) 299-242-68 MG per tablet, Take 2 tablets by mouth every 6 (six) hours as needed for pain., Disp: , Rfl:  .  Cholecalciferol (VITAMIN D) 2000 units tablet, Take 2,000 Units by mouth daily., Disp: , Rfl:  .  estradiol (VIVELLE-DOT) 0.075 MG/24HR, Place onto the skin., Disp: ,  Rfl:  .  Multiple Vitamins-Minerals (CENTRUM SILVER ADULT 50+ PO), Take 1 tablet by mouth daily., Disp: , Rfl:  .  omeprazole (PRILOSEC) 20 MG capsule, TAKE 1 CAPSULE BY MOUTH EVERY DAY, Disp: 90 capsule, Rfl: 3 .  progesterone (PROMETRIUM) 200 MG capsule, Take by mouth., Disp: , Rfl:  .  sodium chloride (OCEAN) 0.65 % nasal spray, Place 1 spray into both nostrils daily as needed (for allergies). , Disp: , Rfl:  .  TURMERIC CURCUMIN PO, Take 1 capsule by mouth daily. 900 mg daily, Disp: , Rfl:  .  atenolol (TENORMIN) 25 MG tablet, Take 0.5-1 tablets (12.5-25 mg total) by mouth every evening. (Patient not taking: No sig reported), Disp: 90 tablet, Rfl: 1  Allergies  Allergen Reactions  . Claritin  [Loratadine] Other (See Comments)  . Codeine Other (See Comments)    Severe headache   . Entex Lq  [Phenylephrine-Guaifenesin] Other (See Comments)  . Fexofenadine Other (See Comments)  . Loratadine-Pseudoephedrine Er Other (See Comments)  . Pseudoephedrine Other (See Comments)  . Tramadol Nausea Only    I personally reviewed active problem list, medication list, allergies, family history, social history with the patient/caregiver today.   ROS  Ten systems reviewed and is negative except as mentioned in HPI   Objective  Virtual encounter, vitals not obtained.  There is no height or weight on file to calculate BMI.  Physical Exam  Awake, alert and oriented   PHQ2/9: Depression screen [facility] 2/9 10/29/2020 04/10/2020 10/13/2019 09/21/2019 08/24/2019  Decreased Interest 0 0 0 0 0  Down, Depressed, Hopeless 0 0 0 0 0  PHQ - 2 Score 0 0 0 0 0  Altered sleeping - - 0 0 0  Tired, decreased energy - - 0 0 0  Change in appetite - - 0 0 0  Feeling bad or failure about yourself  - - 0 0 0  Trouble concentrating - - 0 0 0  Moving slowly or fidgety/restless - - 0 0 0  Suicidal thoughts - - 0 0 0  PHQ-9 Score - - 0 0 0  Difficult doing work/chores - - Not difficult at all Not difficult at all Not  difficult at all   PHQ-2/9 Result is negative.    Fall Risk: Fall Risk  10/29/2020 04/10/2020 10/13/2019 09/21/2019 08/24/2019  Falls in the past year? 0 0 0 0 0  Number falls in past yr: 0 0 0 0 0  Comment - - - - -  Injury with Fall? 0 0 0 0 0  Follow up - - - - -     Assessment & Plan  1. Cough  - benzonatate (TESSALON) 100 MG capsule; Take 1-2 capsules (100-200 mg total) by mouth 2 (two) times daily as needed.  Dispense: 40 capsule; Refill: 0 - Fluticasone-Umeclidin-Vilant (TRELEGY ELLIPTA) 100-62.5-25 MCG/INH AEPB; Inhale 1 puff into the lungs daily.  Dispense:  1 each; Refill: 1  2. SOB (shortness of breath)  - benzonatate (TESSALON) 100 MG capsule; Take 1-2 capsules (100-200 mg total) by mouth 2 (two) times daily as needed.  Dispense: 40 capsule; Refill: 0 - Fluticasone-Umeclidin-Vilant (TRELEGY ELLIPTA) 100-62.5-25 MCG/INH AEPB; Inhale 1 puff into the lungs daily.  Dispense: 1 each; Refill: 1  3. Fatigue, unspecified type  She had rapid test done at work and it was negative, explained if medications don't improve her symptoms she needs to have CXR and consider labs, she is at higher risk for pneumonia due to her personal history of pneumonia  I discussed the assessment and treatment plan with the patient. The patient was provided an opportunity to ask questions and all were answered. The patient agreed with the plan and demonstrated an understanding of the instructions.  The patient was advised to call back or seek an in-person evaluation if the symptoms worsen or if the condition fails to improve as anticipated.  I provided 15  minutes of non-face-to-face time during this encounter.

## 2020-10-30 ENCOUNTER — Telehealth: Payer: No Typology Code available for payment source | Admitting: Family Medicine

## 2020-11-19 ENCOUNTER — Other Ambulatory Visit: Payer: Self-pay | Admitting: Family Medicine

## 2020-11-19 ENCOUNTER — Other Ambulatory Visit: Payer: Self-pay

## 2020-11-19 ENCOUNTER — Telehealth: Payer: Self-pay | Admitting: Family Medicine

## 2020-11-19 DIAGNOSIS — K219 Gastro-esophageal reflux disease without esophagitis: Secondary | ICD-10-CM

## 2020-11-19 MED ORDER — OMEPRAZOLE 20 MG PO CPDR: 20.0000 mg | DELAYED_RELEASE_CAPSULE | Freq: Every day | ORAL | 1 refills | Status: DC

## 2020-11-19 NOTE — Telephone Encounter (Signed)
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 Pt has new pharmacy

## 2021-03-25 NOTE — Progress Notes (Signed)
Name: Monique Daniels   MRN: [id number]    DOB: 1961/03/10   Date:03/26/2021       Progress Note  Subjective  Chief Complaint  Spot on Chest  HPI  Skin lesion: she was seen by Dr. Phillip Heal years ago for pre-cancerous lesions. She noticed a lesion on anterior chest that is raised, red, scaly and growing rapidly over the past two months, she cannot see a Dermatologist in the next few months and came in today for evaluation  Low back pain with radiculitis: symptoms started over one year ago. She had MRI 2018  she has been exercising and losing weight and is doing well, denies back pain at this time. Pain occasionally goes down her legs and usually secondary to intense physical activity and core work.   HTN: off medication and bp is at goal, no chest pain or palpation. BP back to normal with weigh tloss.    GERD: under control with medication, no heartburn, no regurgitation since esophageal dilation 2016, she eats a healthy diet, and takes Omeprazole daily    Migraine: hemiplegic, off all medications and doing well, taking Excedrin prn only Episodes are very sporadic now    Metabolic Syndrome:  denies polyphagia, polydipsia or polyuria. She has been going to the gym , eating healthier. Had gone down from 5.7 % to normal range at 5.3 % we will recheck prior to her CPE   Panic attack: she states she has been feeling great and not panic attack in months, last refill alprazolam refill was 09/2019, doing welll.    Dyslipidemia and history of TIA: she does not want to take statin therapy, discussed aspirin but she forgets to take it. Last LDL was 125 . We will recheck prior to her CPE   Patient Active Problem List   Diagnosis Date Noted   Aneurysm ([place]) 0000000   Metabolic syndrome [id number]   Cervical spinal stenosis 04/08/2016   Prediabetes 01/09/2016   Dyslipidemia 10/03/2015   Seasonal allergic rhinitis 04/29/2015   History of esophageal stricture 01/28/2015   Atypical nevus  01/27/2015   Carpal tunnel syndrome 01/27/2015   Insomnia, persistent 01/27/2015   DDD (degenerative disc disease), cervical 01/27/2015   Panic attacks 01/27/2015   Gastric reflux 01/27/2015   Headache, hemiplegic migraine 01/27/2015   H/O transient cerebral ischemia 01/27/2015   Hypertension, benign 01/27/2015   Calculus of kidney 01/27/2015   Adult BMI 30+ 01/27/2015   Vitamin D deficiency 01/27/2015   Snores 01/27/2015    Past Surgical History:  Procedure Laterality Date   HYSTEROSCOPY WITH D & C N/A 02/07/2015   Procedure: DILATATION AND CURETTAGE /HYSTEROSCOPY;  Surgeon: Will Bonnet, MD;  Location: [facility];  Service: Gynecology;  Laterality: N/A;   HYSTEROSCOPY WITH NOVASURE N/A 02/07/2015   Procedure: HYSTEROSCOPY WITH NOVASURE;  Surgeon: Will Bonnet, MD;  Location: [facility];  Service: Gynecology;  Laterality: N/A;   TOOTH EXTRACTION     TUBAL LIGATION  1989    Family History  Problem Relation Age of Onset   Asthma Mother    Heart disease Mother    Hyperlipidemia Mother    Emphysema Mother    Tremor Mother    Macular degeneration Mother    Asthma Son    Heart attack Father    Cancer Father        Unknown   Urolithiasis Son    Tremor Sister    Tremor Brother    Macular degeneration Maternal Grandmother    Alzheimer's  disease Paternal Grandfather    Breast cancer Neg Hx     Social History   Tobacco Use   Smoking status: Never   Smokeless tobacco: Never  Substance Use Topics   Alcohol use: Not Currently    Comment: used to drink wine very seldom      Current Outpatient Medications:    albuterol (VENTOLIN HFA) 108 (90 Base) MCG/ACT inhaler, TAKE 2 PUFFS BY MOUTH EVERY 6 HOURS AS NEEDED FOR WHEEZE OR SHORTNESS OF BREATH, Disp: 18 g, Rfl: 1   ALPRAZolam (XANAX) 0.5 MG tablet, Take 1 tablet (0.5 mg total) by mouth daily as needed for anxiety (05-1 tablet)., Disp: 10 tablet, Rfl: 0   Ascorbic Acid (VITAMIN C) POWD, Take 500 mg by mouth., Disp: , Rfl:     aspirin-acetaminophen-caffeine (EXCEDRIN MIGRAINE) [id number] MG per tablet, Take 2 tablets by mouth every 6 (six) hours as needed for pain., Disp: , Rfl:    Cholecalciferol (VITAMIN D) 2000 units tablet, Take 2,000 Units by mouth daily., Disp: , Rfl:    Multiple Vitamins-Minerals (CENTRUM SILVER ADULT 50+ PO), Take 1 tablet by mouth daily., Disp: , Rfl:    omeprazole (PRILOSEC) 20 MG capsule, Take 1 capsule (20 mg total) by mouth daily., Disp: 90 capsule, Rfl: 1   sodium chloride (OCEAN) 0.65 % nasal spray, Place 1 spray into both nostrils daily as needed (for allergies). , Disp: , Rfl:    TURMERIC CURCUMIN PO, Take 1 capsule by mouth daily. 900 mg daily, Disp: , Rfl:    estradiol (VIVELLE-DOT) 0.05 MG/24HR patch, 1 patch 2 (two) times a week., Disp: , Rfl:   Allergies  Allergen Reactions   Claritin  [Loratadine] Other (See Comments)   Codeine Other (See Comments)    Severe headache    Entex Lq  [Phenylephrine-Guaifenesin] Other (See Comments)   Fexofenadine Other (See Comments)   Loratadine-Pseudoephedrine Er Other (See Comments)   Pseudoephedrine Other (See Comments)   Tramadol Nausea Only    I personally reviewed active problem list, medication list, allergies, family history, social history, health maintenance with the patient/caregiver today.   ROS  Constitutional: Negative for fever or weight change.  Respiratory: Negative for cough and shortness of breath.   Cardiovascular: Negative for chest pain or palpitations.  Gastrointestinal: Negative for abdominal pain, no bowel changes.  Musculoskeletal: Negative for gait problem or joint swelling.  Skin: Negative for rash.  Neurological: Negative for dizziness or headache.  No other specific complaints in a complete review of systems (except as listed in HPI above).   Objective  Vitals:   03/26/21 0804  BP: 124/72  Pulse: 65  Resp: 16  Temp: 98.3 F (36.8 C)  SpO2: 97%  Weight: 174 lb (78.9 kg)  Height: '5\' 2"'$  (1.575  m)    Body mass index is 31.83 kg/m.  Physical Exam  Constitutional: Patient appears well-developed and well-nourished. Obese  No distress.  HEENT: head atraumatic, normocephalic, pupils equal and reactive to light, neck supple,  Cardiovascular: Normal rate, regular rhythm and normal heart sounds.  No murmur heard. No BLE edema. Pulmonary/Chest: Effort normal and breath sounds normal. No respiratory distress. Abdominal: Soft.  There is no tenderness. Skin: erythematous 1X1 cm lesion on anterior chest, raised, irregular border and rough to touch , non tender  Psychiatric: Patient has a normal mood and affect. behavior is normal. Judgment and thought content normal.   PHQ2/9: Depression screen [facility] 2/9 03/26/2021 10/29/2020 04/10/2020 10/13/2019 09/21/2019  Decreased Interest 0 0 0 0 0  Down, Depressed, Hopeless 0 0 0 0 0  PHQ - 2 Score 0 0 0 0 0  Altered sleeping - - - 0 0  Tired, decreased energy - - - 0 0  Change in appetite - - - 0 0  Feeling bad or failure about yourself  - - - 0 0  Trouble concentrating - - - 0 0  Moving slowly or fidgety/restless - - - 0 0  Suicidal thoughts - - - 0 0  PHQ-9 Score - - - 0 0  Difficult doing work/chores - - - Not difficult at all Not difficult at all    phq 9 is negative   Fall Risk: Fall Risk  03/26/2021 10/29/2020 04/10/2020 10/13/2019 09/21/2019  Falls in the past year? 0 0 0 0 0  Number falls in past yr: 0 0 0 0 0  Comment - - - - -  Injury with Fall? 0 0 0 0 0  Risk for fall due to : No Fall Risks - - - -  Follow up Falls prevention discussed - - - -      Functional Status Survey: Is the patient deaf or have difficulty hearing?: No Does the patient have difficulty seeing, even when wearing glasses/contacts?: Yes Does the patient have difficulty concentrating, remembering, or making decisions?: No Does the patient have difficulty walking or climbing stairs?: No Does the patient have difficulty dressing or bathing?: No Does the patient  have difficulty doing errands alone such as visiting a doctor's office or shopping?: No    Assessment & Plan  1. Dyslipidemia  - Lipid panel  2. History of TIA (transient ischemic attack)  - Lipid panel  3. Gastric reflux   4. Hypertension, benign  - COMPLETE METABOLIC PANEL WITH GFR - CBC with Differential/Platelet  5. Hemiplegic migraine without status migrainosus, not intractable   6. Vitamin D deficiency  - VITAMIN D 25 Hydroxy (Vit-D Deficiency, Fractures)  7. Metabolic syndrome  - Hemoglobin A1c  8. Skin lesion of chest wall  - Surgical pathology   Consent signed: YES  Procedure: Skin lesion Removal/Shave biopsy  Location: anterior chest 1 x 1 cm    Equipment used: derma-blade, high temperature cautery, sterile scalpel, tweezers Anesthesia: lidocaine out of stock/nation back order, patient agreed on using ethyl chloride spray only  Cleaned and prepped: Betadine  After consent signed, are of skin prepped with betadine. Lidocaine w/o epinephrine injected into skin underneath skin mass. After properly numbed sterile equipment used to remove tag.  Specimen sent for pathology analysis. Instructed on proper care to allow for proper healing. F/U for nursing visit if needed.   9. Need for shingles vaccine  - Varicella-zoster vaccine IM

## 2021-03-26 ENCOUNTER — Encounter: Payer: Self-pay | Admitting: Family Medicine

## 2021-03-26 ENCOUNTER — Ambulatory Visit (INDEPENDENT_AMBULATORY_CARE_PROVIDER_SITE_OTHER): Payer: No Typology Code available for payment source | Admitting: Family Medicine

## 2021-03-26 ENCOUNTER — Other Ambulatory Visit (HOSPITAL_COMMUNITY)
Admission: RE | Admit: 2021-03-26 | Discharge: 2021-03-26 | Disposition: A | Discharge status: Home / Self Care | Payer: Self-pay | Source: Ambulatory Visit | Attending: Family Medicine | Admitting: Family Medicine

## 2021-03-26 ENCOUNTER — Other Ambulatory Visit: Payer: Self-pay

## 2021-03-26 VITALS — BP 124/72 | HR 65 | Temp 98.3°F | Resp 16 | Ht 62.0 in | Wt 174.0 lb

## 2021-03-26 DIAGNOSIS — K219 Gastro-esophageal reflux disease without esophagitis: Secondary | ICD-10-CM

## 2021-03-26 DIAGNOSIS — E785 Hyperlipidemia, unspecified: Secondary | ICD-10-CM

## 2021-03-26 DIAGNOSIS — I10 Essential (primary) hypertension: Secondary | ICD-10-CM | POA: Diagnosis not present

## 2021-03-26 DIAGNOSIS — Z8673 Personal history of transient ischemic attack (TIA), and cerebral infarction without residual deficits: Secondary | ICD-10-CM

## 2021-03-26 DIAGNOSIS — E8881 Metabolic syndrome: Secondary | ICD-10-CM

## 2021-03-26 DIAGNOSIS — L989 Disorder of the skin and subcutaneous tissue, unspecified: Secondary | ICD-10-CM | POA: Insufficient documentation

## 2021-03-26 DIAGNOSIS — Z23 Encounter for immunization: Secondary | ICD-10-CM | POA: Diagnosis not present

## 2021-03-26 DIAGNOSIS — E559 Vitamin D deficiency, unspecified: Secondary | ICD-10-CM

## 2021-03-26 DIAGNOSIS — G43409 Hemiplegic migraine, not intractable, without status migrainosus: Secondary | ICD-10-CM

## 2021-03-26 MED ORDER — OMEPRAZOLE 20 MG PO CPDR: 20.0000 mg | DELAYED_RELEASE_CAPSULE | Freq: Every day | ORAL | 3 refills | Status: DC

## 2021-03-27 LAB — CBC WITH DIFFERENTIAL/PLATELET
Absolute Monocytes: 473 cells/uL (ref 200–950)
Basophils Absolute: 73 cells/uL (ref 0–200)
Basophils Relative: 1.4 %
Eosinophils Absolute: 385 cells/uL (ref 15–500)
Eosinophils Relative: 7.4 %
HCT: 42.2 % (ref 35.0–45.0)
Hemoglobin: 14 g/dL (ref 11.7–15.5)
Lymphs Abs: 1737 cells/uL (ref 850–3900)
MCH: 29.6 pg (ref 27.0–33.0)
MCHC: 33.2 g/dL (ref 32.0–36.0)
MCV: 89.2 fL (ref 80.0–100.0)
MPV: 10.8 fL (ref 7.5–12.5)
Monocytes Relative: 9.1 %
Neutro Abs: 2532 cells/uL (ref 1500–7800)
Neutrophils Relative %: 48.7 %
Platelets: 235 10*3/uL (ref 140–400)
RBC: 4.73 10*6/uL (ref 3.80–5.10)
RDW: 11.9 % (ref 11.0–15.0)
Total Lymphocyte: 33.4 %
WBC: 5.2 10*3/uL (ref 3.8–10.8)

## 2021-03-27 LAB — COMPLETE METABOLIC PANEL WITH GFR
AG Ratio: 1.6 (calc) (ref 1.0–2.5)
ALT: 18 U/L (ref 6–29)
AST: 21 U/L (ref 10–35)
Albumin: 3.9 g/dL (ref 3.6–5.1)
Alkaline phosphatase (APISO): 47 U/L (ref 37–153)
BUN: 17 mg/dL (ref 7–25)
CO2: 29 mmol/L (ref 20–32)
Calcium: 9.1 mg/dL (ref 8.6–10.4)
Chloride: 108 mmol/L (ref 98–110)
Creat: 0.72 mg/dL (ref 0.50–1.03)
Globulin: 2.4 g/dL (calc) (ref 1.9–3.7)
Glucose, Bld: 86 mg/dL (ref 65–99)
Potassium: 4 mmol/L (ref 3.5–5.3)
Sodium: 140 mmol/L (ref 135–146)
Total Bilirubin: 0.6 mg/dL (ref 0.2–1.2)
Total Protein: 6.3 g/dL (ref 6.1–8.1)
eGFR: 97 mL/min/{1.73_m2} (ref >=60)

## 2021-03-27 LAB — VITAMIN D 25 HYDROXY (VIT D DEFICIENCY, FRACTURES): Vit D, 25-Hydroxy: 32 ng/mL (ref 30–100)

## 2021-03-27 LAB — LIPID PANEL
Cholesterol: 183 mg/dL (ref <200)
HDL: 56 mg/dL (ref >=50)
LDL Cholesterol (Calc): 113 mg/dL (calc) — ABNORMAL HIGH
Non-HDL Cholesterol (Calc): 127 mg/dL (calc) (ref <130)
Total CHOL/HDL Ratio: 3.3 (calc) (ref <5.0)
Triglycerides: 53 mg/dL (ref <150)

## 2021-03-27 LAB — HEMOGLOBIN A1C
Hgb A1c MFr Bld: 5.2 % of total Hgb (ref <5.7)
Mean Plasma Glucose: 103 mg/dL
eAG (mmol/L): 5.7 mmol/L

## 2021-03-27 LAB — SURGICAL PATHOLOGY

## 2021-05-08 ENCOUNTER — Encounter: Payer: No Typology Code available for payment source | Admitting: Family Medicine

## 2021-05-21 NOTE — Progress Notes (Signed)
Name: Monique Daniels   MRN: [id number]    DOB: May 04, 1961   Date:05/22/2021       Progress Note  Subjective  Chief Complaint  Annual Exam  HPI  Patient presents for annual CPE.  Diet: balanced  Exercise: continue regular physical activity    Flowsheet Row Office Visit from 04/10/2020 in [facility]  AUDIT-C Score 0      Depression: Phq 9 is  negative Depression screen [facility] 2/9 05/22/2021 03/26/2021 10/29/2020 04/10/2020 10/13/2019  Decreased Interest 0 0 0 0 0  Down, Depressed, Hopeless 0 0 0 0 0  PHQ - 2 Score 0 0 0 0 0  Altered sleeping 0 - - - 0  Tired, decreased energy 0 - - - 0  Change in appetite 0 - - - 0  Feeling bad or failure about yourself  0 - - - 0  Trouble concentrating 0 - - - 0  Moving slowly or fidgety/restless 0 - - - 0  Suicidal thoughts 0 - - - 0  PHQ-9 Score 0 - - - 0  Difficult doing work/chores - - - - Not difficult at all  Some recent data might be hidden   Hypertension: BP Readings from Last 3 Encounters:  05/22/21 130/78  03/26/21 124/72  04/10/20 134/74   Obesity: Wt Readings from Last 3 Encounters:  05/22/21 175 lb (79.4 kg)  03/26/21 174 lb (78.9 kg)  04/10/20 183 lb 12.8 oz (83.4 kg)   BMI Readings from Last 3 Encounters:  05/22/21 32.01 kg/m  03/26/21 31.83 kg/m  04/10/20 33.62 kg/m     Vaccines:   Shingrix: 72-64 yo and ask insurance if covered when patient above 23 yo Pneumonia: educated and discussed with patient. Flu: educated and discussed with patient.  Hep C Screening: 07/06/17 STD testing and prevention (HIV/chl/gon/syphilis): 01/21/03 Intimate partner violence: negative Sexual History :no pain or discomfort  Menstrual History/LMP/Abnormal Bleeding: discussed need to follow up if any post-menopausal bleeding  Incontinence Symptoms: no problems, improved since increase physical activity   Breast cancer:  - Last Mammogram: 11/22/19 - BRCA gene screening: N/A  Osteoporosis: Discussed high  calcium and vitamin D supplementation, weight bearing exercises  Cervical cancer screening: Ordered today  Skin cancer: Discussed monitoring for atypical lesions  Colorectal cancer: 07/17/14   Lung cancer: Low Dose CT Chest recommended if Age 49-80 years, 20 pack-year currently smoking OR have quit w/in 15years. Patient does not qualify.   ECG: 07/06/17  Advanced Care Planning: A voluntary discussion about advance care planning including the explanation and discussion of advance directives.  Discussed health care proxy and Living will, and the patient was able to identify a health care proxy as husband.     Lipids: Lab Results  Component Value Date   CHOL 183 03/26/2021   CHOL 198 10/13/2019   CHOL 178 04/17/2019   Lab Results  Component Value Date   HDL 56 03/26/2021   HDL 59 10/13/2019   HDL 59 04/17/2019   Lab Results  Component Value Date   LDLCALC 113 (H) 03/26/2021   LDLCALC 125 (H) 10/13/2019   LDLCALC 105 (H) 04/17/2019   Lab Results  Component Value Date   TRIG 53 03/26/2021   TRIG 51 10/13/2019   TRIG 57 04/17/2019   Lab Results  Component Value Date   CHOLHDL 3.3 03/26/2021   CHOLHDL 3.4 10/13/2019   CHOLHDL 3.0 04/17/2019   No results found for: LDLDIRECT  Glucose: Glucose  Date Value Ref Range  Status  01/18/2013 88 65 - 99 mg/dL Final  01/12/2013 159 (H) 65 - 99 mg/dL Final  07/06/2012 96 65 - 99 mg/dL Final   Glucose, Bld  Date Value Ref Range Status  03/26/2021 86 65 - 99 mg/dL Final    Comment:    .            Fasting reference interval .   10/13/2019 77 65 - 99 mg/dL Final    Comment:    .            Fasting reference interval .   04/17/2019 87 65 - 99 mg/dL Final    Comment:    .            Fasting reference interval .    Glucose-Capillary  Date Value Ref Range Status  05/05/2013 80 70 - 99 mg/dL Final    Patient Active Problem List   Diagnosis Date Noted   Aneurysm ([place]) 42/70/6237   Metabolic syndrome 62/83/1517    Cervical spinal stenosis 04/08/2016   Prediabetes 01/09/2016   Dyslipidemia 10/03/2015   Seasonal allergic rhinitis 04/29/2015   History of esophageal stricture 01/28/2015   Atypical nevus 01/27/2015   Carpal tunnel syndrome 01/27/2015   Insomnia, persistent 01/27/2015   DDD (degenerative disc disease), cervical 01/27/2015   Panic attacks 01/27/2015   Gastric reflux 01/27/2015   Headache, hemiplegic migraine 01/27/2015   H/O transient cerebral ischemia 01/27/2015   Hypertension, benign 01/27/2015   Calculus of kidney 01/27/2015   Adult BMI 30+ 01/27/2015   Vitamin D deficiency 01/27/2015   Snores 01/27/2015    Past Surgical History:  Procedure Laterality Date   HYSTEROSCOPY WITH D & C N/A 02/07/2015   Procedure: DILATATION AND CURETTAGE /HYSTEROSCOPY;  Surgeon: Will Bonnet, MD;  Location: [facility] ORS;  Service: Gynecology;  Laterality: N/A;   HYSTEROSCOPY WITH NOVASURE N/A 02/07/2015   Procedure: HYSTEROSCOPY WITH NOVASURE;  Surgeon: Will Bonnet, MD;  Location: [facility] ORS;  Service: Gynecology;  Laterality: N/A;   TOOTH EXTRACTION     TUBAL LIGATION  1989    Family History  Problem Relation Age of Onset   Asthma Mother    Heart disease Mother    Hyperlipidemia Mother    Emphysema Mother    Tremor Mother    Macular degeneration Mother    Asthma Son    Heart attack Father    Cancer Father        Unknown   Urolithiasis Son    Tremor Sister    Tremor Brother    Macular degeneration Maternal Grandmother    Alzheimer's disease Paternal Grandfather    Breast cancer Neg Hx     Social History   Socioeconomic History   Marital status: Married    Spouse name: Merry Proud   Number of children: 2   Years of education: 12th   Highest education level: Not on file  Occupational History   Occupation: Medical records    Employer: [employer] features editor  Tobacco Use   Smoking status: Never   Smokeless tobacco: Never  Vaping Use   Vaping Use: Never used  Substance and  Sexual Activity   Alcohol use: Not Currently    Comment: used to drink wine very seldom    Drug use: No   Sexual activity: Yes    Partners: Male    Comment: BTL  Other Topics Concern   Not on file  Social History Narrative   Patient lives at home with husband Merry Proud.  Patient works at [employer].    Patient has a high school education.    Patient has 2 children.    Social Determinants of Health   Financial Resource Strain: Low Risk    Difficulty of Paying Living Expenses: Not hard at all  Food Insecurity: No Food Insecurity   Worried About Charity fundraiser in the Last Year: Never true   [place] in the Last Year: Never true  Transportation Needs: No Transportation Needs   Lack of Transportation (Medical): No   Lack of Transportation (Non-Medical): No  Physical Activity: Sufficiently Active   Days of Exercise per Week: 5 days   Minutes of Exercise per Session: 70 min  Stress: No Stress Concern Present   Feeling of Stress : Only a little  Social Connections: Engineer, building services of Communication with Friends and Family: More than three times a week   Frequency of Social Gatherings with Friends and Family: Once a week   Attends Religious Services: More than 4 times per year   Active Member of [employer] or Organizations: Yes   Attends Archivist Meetings: Never   Marital Status: Married  Human resources officer Violence: Not At Risk   Fear of Current or Ex-Partner: No   Emotionally Abused: No   Physically Abused: No   Sexually Abused: No     Current Outpatient Medications:    albuterol (VENTOLIN HFA) 108 (90 Base) MCG/ACT inhaler, TAKE 2 PUFFS BY MOUTH EVERY 6 HOURS AS NEEDED FOR WHEEZE OR SHORTNESS OF BREATH, Disp: 18 g, Rfl: 1   ALPRAZolam (XANAX) 0.5 MG tablet, Take 1 tablet (0.5 mg total) by mouth daily as needed for anxiety (05-1 tablet)., Disp: 10 tablet, Rfl: 0   Ascorbic Acid (VITAMIN C) POWD, Take 500 mg by mouth., Disp: , Rfl:     aspirin-acetaminophen-caffeine (EXCEDRIN MIGRAINE) 970-263-78 MG per tablet, Take 2 tablets by mouth every 6 (six) hours as needed for pain., Disp: , Rfl:    Cholecalciferol (VITAMIN D) 2000 units tablet, Take 2,000 Units by mouth daily., Disp: , Rfl:    estradiol (VIVELLE-DOT) 0.05 MG/24HR patch, 1 patch 2 (two) times a week., Disp: , Rfl:    Multiple Vitamins-Minerals (CENTRUM SILVER ADULT 50+ PO), Take 1 tablet by mouth daily., Disp: , Rfl:    omeprazole (PRILOSEC) 20 MG capsule, Take 1 capsule (20 mg total) by mouth daily., Disp: 90 capsule, Rfl: 3   sodium chloride (OCEAN) 0.65 % nasal spray, Place 1 spray into both nostrils daily as needed (for allergies). , Disp: , Rfl:    TURMERIC CURCUMIN PO, Take 1 capsule by mouth daily. 900 mg daily, Disp: , Rfl:   Allergies  Allergen Reactions   Claritin  [Loratadine] Other (See Comments)   Codeine Other (See Comments)    Severe headache    Entex Lq  [Phenylephrine-Guaifenesin] Other (See Comments)   Fexofenadine Other (See Comments)   Loratadine-Pseudoephedrine Er Other (See Comments)   Pseudoephedrine Other (See Comments)   Tramadol Nausea Only     ROS  Constitutional: Negative for fever or weight change.  Respiratory: Negative for cough and shortness of breath.   Cardiovascular: Negative for chest pain or palpitations.  Gastrointestinal: Negative for abdominal pain, no bowel changes.  Musculoskeletal: Negative for gait problem or joint swelling.  Skin: Negative for rash.  Neurological: Negative for dizziness or headache.  No other specific complaints in a complete review of systems (except as listed in HPI above).  Objective  Vitals:   05/22/21 0733  BP: 130/78  Pulse: 82  Resp: 16  Temp: 98.3 F (36.8 C)  SpO2: 98%  Weight: 175 lb (79.4 kg)  Height: _0  (1.575 m)    Body mass index is 32.01 kg/m.  Physical Exam  Constitutional: Patient appears well-developed and well-nourished. No distress.  HENT: Head:  Normocephalic and atraumatic. Ears: B TMs ok, no erythema or effusion; Nose: Not done  Mouth/Throat: not done  Eyes: Conjunctivae and EOM are normal. Pupils are equal, round, and reactive to light. No scleral icterus.  Neck: Normal range of motion. Neck supple. No JVD present. No thyromegaly present.  Cardiovascular: Normal rate, regular rhythm and normal heart sounds.  No murmur heard. No BLE edema. Pulmonary/Chest: Effort normal and breath sounds normal. No respiratory distress. Abdominal: Soft. Bowel sounds are normal, no distension. There is no tenderness. no masses Breast: no lumps or masses, no nipple discharge or rashes FEMALE GENITALIA:  External genitalia normal External urethra normal Vaginal vault normal without discharge or lesions Cervix friable  Bimanual exam normal without masses RECTAL: not done  Musculoskeletal: Normal range of motion, no joint effusions. No gross deformities Neurological: he is alert and oriented to person, place, and time. No cranial nerve deficit. Coordination, balance, strength, speech and gait are normal.  Skin: Skin is warm and dry. No rash noted. No erythema.  Psychiatric: Patient has a normal mood and affect. behavior is normal. Judgment and thought content normal.   Recent Results (from the past 2160 hour(s))  Surgical pathology     Status: None   Collection Time: 03/26/21  8:35 AM  Result Value Ref Range   SURGICAL PATHOLOGY      SURGICAL PATHOLOGY CASE: [phone] PATIENT: [facility] Report     Clinical History: skin lesion of chest wall, showed up about 2 months ago and growing fast (cm)     FINAL MICROSCOPIC DIAGNOSIS:  A. SKIN, ANTERIOR CHEST, EXCISION: - Squamous cell carcinoma in situ arising in inflamed actinic keratosis.   COMMENT:  Dermatopathology has reviewed the case.   GROSS DESCRIPTION:  Received in formalin is a 1.1 x 0.8 cm pink-tan centrally granular skin shave which is inked,  sectioned and submitted 1 block.  SW 03/26/2021   Final Diagnosis performed by Vicente Males, MD.   Electronically signed 03/27/2021 Technical component performed at [facility]. [facility], [place] [address] [place], [place] [address].  Professional component performed at [facility], [place] [address], [place], [place] [address].  Immunohistochemistry Technical component (if applicable) was performed at [facility].  [address] [place], [place], [place] [address].   IMMUNOHISTOCHEMISTRY DISCLAIMER (if applicable): Some of these immunohistochemical stains may have been developed and the performance characteristics determine by [facility]. Some may not have been cleared or approved by the U.S. Food and Drug Administration. The FDA has determined that such clearance or approval is not necessary. This test is used for clinical purposes. It should not be regarded as investigational or for research. This laboratory is certified under the [place] (CLIA-88) as qualified to perform high complexity clinical laboratory testing.  The controls stained appropriately.   VITAMIN D 25 Hydroxy (Vit-D Deficiency, Fractures)     Status: None   Collection Time: 03/26/21  8:57 AM  Result Value Ref Range   Vit D, 25-Hydroxy 32 30 - 100 ng/mL    Comment: Vitamin D Status  25-OH Vitamin D: . Deficiency:                    <20 ng/mL Insufficiency:             20 - 29 ng/mL Optimal:                 > or = 30 ng/mL . For 25-OH Vitamin D testing on patients on  D2-supplementation and patients for whom quantitation  of D2 and D3 fractions is required, the QuestAssureD(TM) 25-OH VIT D, (D2,D3), LC/MS/MS is recommended: order  code [id number] (patients >61yr). See Note 1 . Note 1 . For additional information, please refer to  http://education.[facility].com/faq/FAQ199  (This link is being provided  for informational/ educational purposes only.)   Lipid panel     Status: Abnormal   Collection Time: 03/26/21  8:57 AM  Result Value Ref Range   Cholesterol 183 <200 mg/dL   HDL 56 > OR = 50 mg/dL   Triglycerides 53 <150 mg/dL   LDL Cholesterol (Calc) 113 (H) mg/dL (calc)    Comment: Reference range: <100 . Desirable range <100 mg/dL for primary prevention;   <70 mg/dL for patients with CHD or diabetic patients  with > or = 2 CHD risk factors. .Marland KitchenLDL-C is now calculated using the Martin-Hopkins  calculation, which is a validated novel method providing  better accuracy than the Friedewald equation in the  estimation of LDL-C.  MCresenciano Genreet al. JAnnamaria Helling 22800;349(17: [id number]  (http://education.[facility].com/faq/FAQ164)    Total CHOL/HDL Ratio 3.3 <5.0 (calc)   Non-HDL Cholesterol (Calc) 127 <130 mg/dL (calc)    Comment: For patients with diabetes plus 1 major ASCVD risk  factor, treating to a non-HDL-C goal of <100 mg/dL  (LDL-C of <70 mg/dL) is considered a therapeutic  option.   COMPLETE METABOLIC PANEL WITH GFR     Status: None   Collection Time: 03/26/21  8:57 AM  Result Value Ref Range   Glucose, Bld 86 65 - 99 mg/dL    Comment: .            Fasting reference interval .    BUN 17 7 - 25 mg/dL   Creat 0.72 0.50 - 1.03 mg/dL   eGFR 97 > OR = 60 mL/min/1.741m   Comment: The eGFR is based on the CKD-EPI 2021 equation. To calculate  the new eGFR from a previous Creatinine or Cystatin C result, go to https://www.kidney.org/professionals/ kdoqi/gfr%5Fcalculator    BUN/Creatinine Ratio NOT APPLICABLE 6 - 22 (calc)   Sodium 140 135 - 146 mmol/L   Potassium 4.0 3.5 - 5.3 mmol/L   Chloride 108 98 - 110 mmol/L   CO2 29 20 - 32 mmol/L   Calcium 9.1 8.6 - 10.4 mg/dL   Total Protein 6.3 6.1 - 8.1 g/dL   Albumin 3.9 3.6 - 5.1 g/dL   Globulin 2.4 1.9 - 3.7 g/dL (calc)   AG Ratio 1.6 1.0 - 2.5 (calc)   Total Bilirubin 0.6 0.2 - 1.2 mg/dL   Alkaline phosphatase (APISO)  47 37 - 153 U/L   AST 21 10 - 35 U/L   ALT 18 6 - 29 U/L  CBC with Differential/Platelet     Status: None   Collection Time: 03/26/21  8:57 AM  Result Value Ref Range   WBC 5.2 3.8 - 10.8 Thousand/uL   RBC 4.73 3.80 - 5.10 Million/uL   Hemoglobin 14.0 11.7 - 15.5 g/dL   HCT 42.2 35.0 - 45.0 %  MCV 89.2 80.0 - 100.0 fL   MCH 29.6 27.0 - 33.0 pg   MCHC 33.2 32.0 - 36.0 g/dL   RDW 11.9 11.0 - 15.0 %   Platelets 235 140 - 400 Thousand/uL   MPV 10.8 7.5 - 12.5 fL   Neutro Abs 2,532 1,500 - 7,800 cells/uL   Lymphs Abs 1,737 850 - 3,900 cells/uL   Absolute Monocytes 473 200 - 950 cells/uL   Eosinophils Absolute 385 15 - 500 cells/uL   Basophils Absolute 73 0 - 200 cells/uL   Neutrophils Relative % 48.7 %   Total Lymphocyte 33.4 %   Monocytes Relative 9.1 %   Eosinophils Relative 7.4 %   Basophils Relative 1.4 %  Hemoglobin A1c     Status: None   Collection Time: 03/26/21  8:57 AM  Result Value Ref Range   Hgb A1c MFr Bld 5.2 <5.7 % of total Hgb    Comment: For the purpose of screening for the presence of diabetes: . <5.7%       Consistent with the absence of diabetes 5.7-6.4%    Consistent with increased risk for diabetes             (prediabetes) > or =6.5%  Consistent with diabetes . This assay result is consistent with a decreased risk of diabetes. . Currently, no consensus exists regarding use of hemoglobin A1c for diagnosis of diabetes in children. . According to American Diabetes Association (ADA) guidelines, hemoglobin A1c <7.0% represents optimal control in non-pregnant diabetic patients. Different metrics may apply to specific patient populations.  Standards of Medical Care in Diabetes(ADA). .    Mean Plasma Glucose 103 mg/dL   eAG (mmol/L) 5.7 mmol/L     Fall Risk: Fall Risk  05/22/2021 03/26/2021 10/29/2020 04/10/2020 10/13/2019  Falls in the past year? 0 0 0 0 0  Number falls in past yr: 0 0 0 0 0  Comment - - - - -  Injury with Fall? 0 0 0 0 0  Risk for  fall due to : No Fall Risks No Fall Risks - - -  Follow up Falls prevention discussed Falls prevention discussed - - -     Functional Status Survey: Is the patient deaf or have difficulty hearing?: No Does the patient have difficulty seeing, even when wearing glasses/contacts?: No Does the patient have difficulty concentrating, remembering, or making decisions?: No Does the patient have difficulty walking or climbing stairs?: No Does the patient have difficulty dressing or bathing?: No Does the patient have difficulty doing errands alone such as visiting a doctor's office or shopping?: No   Assessment & Plan  1. Well adult exam   2. Cervical cancer screening  - Cytology - PAP  3. Needs flu shot  - Flu Vaccine QUAD 6+ mos PF IM (Fluarix Quad PF)  4. Need for pneumococcal vaccine  - Pneumococcal conjugate vaccine 20-valent (Prevnar 20)  5. Breast cancer screening by mammogram  - MM 3D SCREEN BREAST BILATERAL; Future   -USPSTF grade A and B recommendations reviewed with patient; age-appropriate recommendations, preventive care, screening tests, etc discussed and encouraged; healthy living encouraged; see AVS for patient education given to patient -Discussed importance of 150 minutes of physical activity weekly, eat two servings of fish weekly, eat one serving of tree nuts ( cashews, pistachios, pecans, almonds.Marland Kitchen) every other day, eat 6 servings of fruit/vegetables daily and drink plenty of water and avoid sweet beverages.

## 2021-05-21 NOTE — Patient Instructions (Signed)
Preventive Care 40-58 Years Old, Female Preventive care refers to lifestyle choices and visits with your health care provider that can promote health and wellness. This includes: A yearly physical exam. This is also called an annual wellness visit. Regular dental and eye exams. Immunizations. Screening for certain conditions. Healthy lifestyle choices, such as: Eating a healthy diet. Getting regular exercise. Not using drugs or products that contain nicotine and tobacco. Limiting alcohol use. What can I expect for my preventive care visit? Physical exam Your health care provider will check your: Height and weight. These may be used to calculate your BMI (body mass index). BMI is a measurement that tells if you are at a healthy weight. Heart rate and blood pressure. Body temperature. Skin for abnormal spots. Counseling Your health care provider may ask you questions about your: Past medical problems. Family's medical history. Alcohol, tobacco, and drug use. Emotional well-being. Home life and relationship well-being. Sexual activity. Diet, exercise, and sleep habits. Work and work environment. Access to firearms. Method of birth control. Menstrual cycle. Pregnancy history. What immunizations do I need? Vaccines are usually given at various ages, according to a schedule. Your health care provider will recommend vaccines for you based on your age, medical history, and lifestyle or other factors, such as travel or where you work. What tests do I need? Blood tests Lipid and cholesterol levels. These may be checked every 5 years, or more often if you are over 58 years old. Hepatitis C test. Hepatitis B test. Screening Lung cancer screening. You may have this screening every year starting at age 58 if you have a 30-pack-year history of smoking and currently smoke or have quit within the past 15 years. Colorectal cancer screening. All adults should have this screening starting at  age 58 and continuing until age 75. Your health care provider may recommend screening at age 58 if you are at increased risk. You will have tests every 1-10 years, depending on your results and the type of screening test. Diabetes screening. This is done by checking your blood sugar (glucose) after you have not eaten for a while (fasting). You may have this done every 1-3 years. Mammogram. This may be done every 1-2 years. Talk with your health care provider about when you should start having regular mammograms. This may depend on whether you have a family history of breast cancer. BRCA-related cancer screening. This may be done if you have a family history of breast, ovarian, tubal, or peritoneal cancers. Pelvic exam and Pap test. This may be done every 3 years starting at age 58. Starting at age 58, this may be done every 5 years if you have a Pap test in combination with an HPV test. Other tests STD (sexually transmitted disease) testing, if you are at risk. Bone density scan. This is done to screen for osteoporosis. You may have this scan if you are at high risk for osteoporosis. Talk with your health care provider about your test results, treatment options, and if necessary, the need for more tests. Follow these instructions at home: Eating and drinking  Eat a diet that includes fresh fruits and vegetables, whole grains, lean protein, and low-fat dairy products. Take vitamin and mineral supplements as recommended by your health care provider. Do not drink alcohol if: Your health care provider tells you not to drink. You are pregnant, may be pregnant, or are planning to become pregnant. If you drink alcohol: Limit how much you have to 0-1 drink a day. Be   aware of how much alcohol is in your drink. In the [place] one drink equals one 12 oz bottle of beer (355 mL), one 5 oz glass of wine (148 mL), or one 1 oz glass of hard liquor (44 mL). Lifestyle Take daily care of your teeth and  gums. Brush your teeth every morning and night with fluoride toothpaste. Floss one time each day. Stay active. Exercise for at least 30 minutes 5 or more days each week. Do not use any products that contain nicotine or tobacco, such as cigarettes, e-cigarettes, and chewing tobacco. If you need help quitting, ask your health care provider. Do not use drugs. If you are sexually active, practice safe sex. Use a condom or other form of protection to prevent STIs (sexually transmitted infections). If you do not wish to become pregnant, use a form of birth control. If you plan to become pregnant, see your health care provider for a prepregnancy visit. If told by your health care provider, take low-dose aspirin daily starting at age 58. Find healthy ways to cope with stress, such as: Meditation, yoga, or listening to music. Journaling. Talking to a trusted person. Spending time with friends and family. Safety Always wear your seat belt while driving or riding in a vehicle. Do not drive: If you have been drinking alcohol. Do not ride with someone who has been drinking. When you are tired or distracted. While texting. Wear a helmet and other protective equipment during sports activities. If you have firearms in your house, make sure you follow all gun safety procedures. What's next? Visit your health care provider once a year for an annual wellness visit. Ask your health care provider how often you should have your eyes and teeth checked. Stay up to date on all vaccines. This information is not intended to replace advice given to you by your health care provider. Make sure you discuss any questions you have with your health care provider. Document Revised: 09/27/2020 Document Reviewed: 03/31/2018 Elsevier Patient Education  2022 [employer].

## 2021-05-22 ENCOUNTER — Other Ambulatory Visit: Payer: Self-pay

## 2021-05-22 ENCOUNTER — Other Ambulatory Visit (HOSPITAL_COMMUNITY)
Admission: RE | Admit: 2021-05-22 | Discharge: 2021-05-22 | Disposition: A | Discharge status: Home / Self Care | Payer: Self-pay | Source: Ambulatory Visit | Attending: Family Medicine | Admitting: Family Medicine

## 2021-05-22 ENCOUNTER — Encounter: Payer: Self-pay | Admitting: Family Medicine

## 2021-05-22 ENCOUNTER — Ambulatory Visit (INDEPENDENT_AMBULATORY_CARE_PROVIDER_SITE_OTHER): Payer: No Typology Code available for payment source | Admitting: Family Medicine

## 2021-05-22 VITALS — BP 130/78 | HR 82 | Temp 98.3°F | Resp 16 | Ht 62.0 in | Wt 175.0 lb

## 2021-05-22 DIAGNOSIS — Z23 Encounter for immunization: Secondary | ICD-10-CM

## 2021-05-22 DIAGNOSIS — Z124 Encounter for screening for malignant neoplasm of cervix: Secondary | ICD-10-CM

## 2021-05-22 DIAGNOSIS — Z1231 Encounter for screening mammogram for malignant neoplasm of breast: Secondary | ICD-10-CM | POA: Diagnosis not present

## 2021-05-22 DIAGNOSIS — Z0000 Encounter for general adult medical examination without abnormal findings: Secondary | ICD-10-CM | POA: Diagnosis not present

## 2021-05-23 LAB — CYTOLOGY - PAP
Comment: NEGATIVE
Diagnosis: NEGATIVE
High risk HPV: NEGATIVE

## 2021-11-19 NOTE — Progress Notes (Signed)
Name: Monique Daniels   MRN: [id number]    DOB: [birth year]   Date:11/20/2021 ? ?     Progress Note ? ?Subjective ? ?Chief Complaint ? ?Follow Up ? ?HPI ? ?Skin lesion: she was seen by Dr. Phillip Heal years ago for pre-cancerous lesions. She noticed a lesion on anterior chest that is raised, red, scaly and growing rapidly, it is itchy and because of her insurance she did not go see Dr. Phillip Heal, we will do the biopsy in our office today  ? ?Low back pain with radiculitis: symptoms started over one year ago. She had MRI 2018  she has been exercising and losing weight and is doing well, denies back pain at this time. Doing great  ? ?History of HTN: no longer taking medication, improved with weight loss, she gained 10 lbs in the past 6 months but bp is still at goal  ?  ?GERD: under control with medication, no heartburn, no regurgitation since esophageal dilation 2016, she eats a healthy diet, and takes Omeprazole prn and doing well lately. She states change in her diet has improved symptoms   ?  ?Migraine: hemiplegic, off all medications and doing well, taking Excedrin prn only Episodes are very sporadic now  ?  ?Metabolic Syndrome:  denies polyphagia, polydipsia or polyuria. She has been going to the gym , eating healthier. Had gone down from 5.7 % to normal range at 5.2%  ?  ?Panic attack: she is under more stress lately, she has a 26 yo step son that lives with them , they are supporting him financially, last alprazolam was given  09/2019. She would like to have him moved out .  ?  ?Dyslipidemia and history of TIA: she does not want to take statin therapy, discussed aspirin but she forgets to take it. Explained goal LDL is below 70 . Last LDL was 113  ? ?URI: symptoms last week with rhinorrhea, nasal congestion and a cough, she had a low grade fever last week, had some nausea and fatigue. She is taking allegra, zyrtec and using saline spray and feeling better today. ?  ?Patient Active Problem List  ? Diagnosis Date Noted   ? Metabolic syndrome 14/48/1856  ? Cervical spinal stenosis 04/08/2016  ? Prediabetes 01/09/2016  ? Dyslipidemia 10/03/2015  ? Seasonal allergic rhinitis 04/29/2015  ? History of esophageal stricture 01/28/2015  ? Atypical nevus 01/27/2015  ? Carpal tunnel syndrome 01/27/2015  ? Insomnia, persistent 01/27/2015  ? DDD (degenerative disc disease), cervical 01/27/2015  ? Panic attacks 01/27/2015  ? Gastric reflux 01/27/2015  ? Headache, hemiplegic migraine 01/27/2015  ? H/O transient cerebral ischemia 01/27/2015  ? Hypertension, benign 01/27/2015  ? Calculus of kidney 01/27/2015  ? Adult BMI 30+ 01/27/2015  ? Vitamin D deficiency 01/27/2015  ? Snores 01/27/2015  ? ? ?Past Surgical History:  ?Procedure Laterality Date  ? HYSTEROSCOPY WITH D & C N/A 02/07/2015  ? Procedure: DILATATION AND CURETTAGE /HYSTEROSCOPY;  Surgeon: Will Bonnet, MD;  Location: [facility] OR[facility];  Service: Gynecology;  Laterality: N/A;  ? HYSTEROSCOPY WITH NOVASURE N/A 02/07/2015  ? Procedure: HYSTEROSCOPY WITH NOVASURE;  Surgeon: Will Bonnet, MD;  Location: [facility];  Service: Gynecology;  Laterality: N/A;  ? TOOTH EXTRACTION    ? TUBAL LIGATION  1989  ? ? ?Family History  ?Problem Relation Age of Onset  ? Asthma Mother   ? Heart disease Mother   ? Hyperlipidemia Mother   ? Emphysema Mother   ? Tremor Mother   ?  Macular degeneration Mother   ? Asthma Son   ? Heart attack Father   ? Cancer Father   ?     Unknown  ? Urolithiasis Son   ? Tremor Sister   ? Tremor Brother   ? Macular degeneration Maternal Grandmother   ? Alzheimer's disease Paternal Grandfather   ? Breast cancer Neg Hx   ? ? ?Social History  ? ?Tobacco Use  ? Smoking status: Never  ? Smokeless tobacco: Never  ?Substance Use Topics  ? Alcohol use: Not Currently  ?  Comment: used to drink wine very seldom   ? ? ? ?Current Outpatient Medications:  ?  albuterol (VENTOLIN HFA) 108 (90 Base) MCG/ACT inhaler, TAKE 2 PUFFS BY MOUTH EVERY 6 HOURS AS NEEDED FOR WHEEZE OR SHORTNESS OF BREATH,  Disp: 18 g, Rfl: 1 ?  ALPRAZolam (XANAX) 0.5 MG tablet, Take 1 tablet (0.5 mg total) by mouth daily as needed for anxiety (05-1 tablet)., Disp: 10 tablet, Rfl: 0 ?  Ascorbic Acid (VITAMIN C) POWD, Take 500 mg by mouth., Disp: , Rfl:  ?  aspirin-acetaminophen-caffeine (EXCEDRIN MIGRAINE) 048-889-16 MG per tablet, Take 2 tablets by mouth every 6 (six) hours as needed for pain., Disp: , Rfl:  ?  Cholecalciferol (VITAMIN D) 2000 units tablet, Take 2,000 Units by mouth daily., Disp: , Rfl:  ?  Multiple Vitamins-Minerals (CENTRUM SILVER ADULT 50+ PO), Take 1 tablet by mouth daily., Disp: , Rfl:  ?  omeprazole (PRILOSEC) 20 MG capsule, Take 1 capsule (20 mg total) by mouth daily., Disp: 90 capsule, Rfl: 3 ?  sodium chloride (OCEAN) 0.65 % nasal spray, Place 1 spray into both nostrils daily as needed (for allergies). , Disp: , Rfl:  ?  TURMERIC CURCUMIN PO, Take 1 capsule by mouth daily. 900 mg daily, Disp: , Rfl:  ? ?Allergies  ?Allergen Reactions  ? Claritin  [Loratadine] Other (See Comments)  ? Codeine Other (See Comments)  ?  Severe headache   ? Entex Lq  [Phenylephrine-Guaifenesin] Other (See Comments)  ? Fexofenadine Other (See Comments)  ? Loratadine-Pseudoephedrine Er Other (See Comments)  ? Pseudoephedrine Other (See Comments)  ? Tramadol Nausea Only  ? ? ?I personally reviewed active problem list, medication list, allergies, family history, social history, health maintenance with the patient/caregiver today. ? ? ?ROS ? ?Constitutional: Negative for fever or weight change.  ?Respiratory: Negative for cough and shortness of breath.   ?Cardiovascular: Negative for chest pain or palpitations.  ?Gastrointestinal: Negative for abdominal pain, no bowel changes.  ?Musculoskeletal: Negative for gait problem or joint swelling.  ?Skin: Negative for rash. Positive lesion on anterior chest, white in tone, raised and smooth, borders also smooth, size 0.6 X0.8 mm  ?Neurological: Negative for dizziness or headache.  ?No other  specific complaints in a complete review of systems (except as listed in HPI above).  ? ?Objective ? ?Vitals:  ? 11/20/21 0745  ?BP: 132/84  ?Pulse: 72  ?Resp: 16  ?SpO2: 98%  ?Weight: 185 lb (83.9 kg)  ?Height: '5\' 1"'$  (1.549 m)  ? ? ?Body mass index is 34.96 kg/m?. ? ?Physical Exam ? ?Constitutional: Patient appears well-developed and well-nourished. Obese No distress.  ?HEENT: head atraumatic, normocephalic, pupils equal and reactive to light,  neck supple ?Cardiovascular: Normal rate, regular rhythm and normal heart sounds.  No murmur heard. No BLE edema. ?Pulmonary/Chest: Effort normal and breath sounds normal. No respiratory distress. ?Abdominal: Soft.  There is no tenderness. ?Psychiatric: Patient has a normal mood and affect. behavior is  normal. Judgment and thought content normal.  ? ?PHQ2/9: ? ?  11/20/2021  ?  7:44 AM 05/22/2021  ?  7:33 AM 03/26/2021  ?  8:03 AM 10/29/2020  ? 10:35 AM 04/10/2020  ? 11:16 AM  ?Depression screen PHQ 2/9  ?Decreased Interest 0 0 0 0 0  ?Down, Depressed, Hopeless 0 0 0 0 0  ?PHQ - 2 Score 0 0 0 0 0  ?Altered sleeping 0 0     ?Tired, decreased energy 0 0     ?Change in appetite 0 0     ?Feeling bad or failure about yourself  0 0     ?Trouble concentrating 0 0     ?Moving slowly or fidgety/restless 0 0     ?Suicidal thoughts 0 0     ?PHQ-9 Score 0 0     ?  ?phq 9 is negative ? ? ?Fall Risk: ? ?  11/20/2021  ?  7:44 AM 05/22/2021  ?  7:32 AM 03/26/2021  ?  8:03 AM 10/29/2020  ? 10:35 AM 04/10/2020  ? 11:16 AM  ?Fall Risk   ?Falls in the past year? 0 0 0 0 0  ?Number falls in past yr: 0 0 0 0 0  ?Injury with Fall? 0 0 0 0 0  ?Risk for fall due to : No Fall Risks No Fall Risks No Fall Risks    ?Follow up Falls prevention discussed Falls prevention discussed Falls prevention discussed    ? ? ? ? ?Functional Status Survey: ?Is the patient deaf or have difficulty hearing?: No ?Does the patient have difficulty seeing, even when wearing glasses/contacts?: No ?Does the patient have difficulty  concentrating, remembering, or making decisions?: No ?Does the patient have difficulty walking or climbing stairs?: No ?Does the patient have difficulty dressing or bathing?: No ?Does the patient have difficulty

## 2021-11-20 ENCOUNTER — Other Ambulatory Visit (HOSPITAL_COMMUNITY)
Admission: RE | Admit: 2021-11-20 | Discharge: 2021-11-20 | Disposition: A | Discharge status: Home / Self Care | Payer: Self-pay | Source: Ambulatory Visit | Attending: Family Medicine | Admitting: Family Medicine

## 2021-11-20 ENCOUNTER — Ambulatory Visit (INDEPENDENT_AMBULATORY_CARE_PROVIDER_SITE_OTHER): Payer: No Typology Code available for payment source | Admitting: Family Medicine

## 2021-11-20 ENCOUNTER — Encounter: Payer: Self-pay | Admitting: Family Medicine

## 2021-11-20 VITALS — BP 132/84 | HR 72 | Resp 16 | Ht 61.0 in | Wt 185.0 lb

## 2021-11-20 DIAGNOSIS — E8881 Metabolic syndrome: Secondary | ICD-10-CM

## 2021-11-20 DIAGNOSIS — G43409 Hemiplegic migraine, not intractable, without status migrainosus: Secondary | ICD-10-CM

## 2021-11-20 DIAGNOSIS — L989 Disorder of the skin and subcutaneous tissue, unspecified: Secondary | ICD-10-CM | POA: Insufficient documentation

## 2021-11-20 DIAGNOSIS — Z8679 Personal history of other diseases of the circulatory system: Secondary | ICD-10-CM

## 2021-11-20 DIAGNOSIS — J069 Acute upper respiratory infection, unspecified: Secondary | ICD-10-CM

## 2021-11-20 DIAGNOSIS — Z8673 Personal history of transient ischemic attack (TIA), and cerebral infarction without residual deficits: Secondary | ICD-10-CM

## 2021-11-20 DIAGNOSIS — E785 Hyperlipidemia, unspecified: Secondary | ICD-10-CM

## 2021-11-20 DIAGNOSIS — E559 Vitamin D deficiency, unspecified: Secondary | ICD-10-CM

## 2021-11-20 DIAGNOSIS — K219 Gastro-esophageal reflux disease without esophagitis: Secondary | ICD-10-CM

## 2021-11-20 MED ORDER — BENZONATATE 100 MG PO CAPS: 100.0000 mg | ORAL_CAPSULE | Freq: Two times a day (BID) | ORAL | 0 refills | Status: DC | PRN

## 2021-11-20 MED ORDER — LIDOCAINE HCL (PF) 1 % IJ SOLN
2.0000 mL | Freq: Once | INTRAMUSCULAR | Status: AC
  Administered 2021-11-20: 2 mL via INTRADERMAL

## 2021-11-20 MED ORDER — OMEPRAZOLE 20 MG PO CPDR: 20.0000 mg | DELAYED_RELEASE_CAPSULE | Freq: Every day | ORAL | 3 refills | Status: DC

## 2021-11-24 ENCOUNTER — Other Ambulatory Visit: Payer: Self-pay | Admitting: Family Medicine

## 2021-11-24 DIAGNOSIS — D229 Melanocytic nevi, unspecified: Secondary | ICD-10-CM

## 2021-11-24 LAB — SURGICAL PATHOLOGY

## 2021-12-16 ENCOUNTER — Telehealth: Payer: Self-pay

## 2021-12-16 ENCOUNTER — Other Ambulatory Visit: Payer: Self-pay | Admitting: Family Medicine

## 2021-12-16 DIAGNOSIS — D229 Melanocytic nevi, unspecified: Secondary | ICD-10-CM

## 2021-12-16 NOTE — Telephone Encounter (Signed)
Copied from [place] [phone]. Topic: Referral - Status ?>> Dec 16, 2021 11:02 AM Yvette Rack wrote: ?Reason for CRM: Pt requests that the referral be sent to [facility] ph# [phone] ?

## 2022-04-29 ENCOUNTER — Ambulatory Visit: Payer: Self-pay | Admitting: Dermatology

## 2022-05-27 ENCOUNTER — Encounter: Payer: No Typology Code available for payment source | Admitting: Family Medicine

## 2022-07-08 ENCOUNTER — Encounter: Payer: Self-pay | Admitting: Family Medicine

## 2022-07-08 ENCOUNTER — Telehealth: Payer: No Typology Code available for payment source | Admitting: Family Medicine

## 2022-07-08 VITALS — Ht 61.0 in | Wt 185.0 lb

## 2022-07-08 DIAGNOSIS — Z1231 Encounter for screening mammogram for malignant neoplasm of breast: Secondary | ICD-10-CM

## 2022-07-08 DIAGNOSIS — Z91199 Patient's noncompliance with other medical treatment and regimen due to unspecified reason: Secondary | ICD-10-CM

## 2022-07-08 NOTE — Progress Notes (Signed)
Unable to connect with patient for virtual visit

## 2022-10-12 ENCOUNTER — Ambulatory Visit (INDEPENDENT_AMBULATORY_CARE_PROVIDER_SITE_OTHER): Payer: No Typology Code available for payment source | Admitting: Physician Assistant

## 2022-10-12 ENCOUNTER — Other Ambulatory Visit: Payer: Self-pay

## 2022-10-12 ENCOUNTER — Encounter: Payer: Self-pay | Admitting: Physician Assistant

## 2022-10-12 VITALS — BP 122/84 | HR 71 | Temp 98.3°F | Resp 16 | Ht 61.0 in | Wt 187.5 lb

## 2022-10-12 DIAGNOSIS — L84 Corns and callosities: Secondary | ICD-10-CM | POA: Diagnosis not present

## 2022-10-12 NOTE — Progress Notes (Unsigned)
Acute Office Visit   Patient: Monique Daniels   DOB: Aug 17, 1960   62 y.o. Female  MRN: [id number] Visit Date: 10/12/2022  Today's healthcare provider: Dani Gobble Daimen Shovlin, PA-C  Introduced myself to the patient as a Journalist, newspaper and provided education on APPs in clinical practice.    Chief Complaint  Patient presents with   Foot Injury    Left foot feels like something is in there for 2 months   Subjective    HPI HPI     Foot Injury    Additional comments: Left foot feels like something is in there for 2 months      Last edited by Hollie Salk, RMA on 10/12/2022  3:16 PM.       Left foot Injury  Onset: gradual Duration: about 2 months States it feels like something is in her foot   Location: plantar aspect of foot, pad below fourth toe on the left foot  Radiation: none  Pain level and character: minimal pain but does feel uncomfortable if pressure is applied at certain angles and becomes more uncomfortable when not wearing supportive, cushioned shoes  Skin changes: skin feels a bit hard over the area  Interventions: none  Alleviating: cushioned, supportive shoes Aggravating: wearing house slippers or placing weight on it at a certain angle   She reports she has been more active lately and is going to the gym several times per week    Medications: Outpatient Medications Prior to Visit  Medication Sig   albuterol (VENTOLIN HFA) 108 (90 Base) MCG/ACT inhaler TAKE 2 PUFFS BY MOUTH EVERY 6 HOURS AS NEEDED FOR WHEEZE OR SHORTNESS OF BREATH   ALPRAZolam (XANAX) 0.5 MG tablet Take 1 tablet (0.5 mg total) by mouth daily as needed for anxiety (05-1 tablet).   Ascorbic Acid (VITAMIN C) POWD Take 500 mg by mouth.   aspirin-acetaminophen-caffeine (EXCEDRIN MIGRAINE) 250-250-65 MG per tablet Take 2 tablets by mouth every 6 (six) hours as needed for pain.   benzonatate (TESSALON) 100 MG capsule Take 1 capsule (100 mg total) by mouth 2 (two) times daily as needed for cough.    Cholecalciferol (VITAMIN D) 2000 units tablet Take 2,000 Units by mouth daily.   Multiple Vitamins-Minerals (CENTRUM SILVER ADULT 50+ PO) Take 1 tablet by mouth daily.   omeprazole (PRILOSEC) 20 MG capsule Take 1 capsule (20 mg total) by mouth daily.   sodium chloride (OCEAN) 0.65 % nasal spray Place 1 spray into both nostrils daily as needed (for allergies).    TURMERIC CURCUMIN PO Take 1 capsule by mouth daily. 900 mg daily   No facility-administered medications prior to visit.    Review of Systems  Skin:        Concern for bump on skin of foot        Objective    BP 122/84   Pulse 71   Temp 98.3 F (36.8 C) (Oral)   Resp 16   Ht '5\' 1"'$  (1.549 m)   Wt 187 lb 8 oz (85 kg)   SpO2 98%   BMI 35.43 kg/m    Physical Exam Vitals reviewed.  Constitutional:      General: She is awake.     Appearance: Normal appearance. She is well-developed and well-groomed.  HENT:     Head: Normocephalic and atraumatic.  Cardiovascular:     Pulses:          Dorsalis pedis pulses are 2+ on the left side.  Posterior tibial pulses are 2+ on the left side.  Musculoskeletal:     Left foot: Normal range of motion. No deformity, foot drop or prominent metatarsal heads.       Feet:  Feet:     Left foot:     Skin integrity: Callus and dry skin present. No ulcer, blister, skin breakdown or erythema.  Neurological:     Mental Status: She is alert.  Psychiatric:        Behavior: Behavior is cooperative.       No results found for any visits on 10/12/22.  Assessment & Plan      No follow-ups on file.      Problem List Items Addressed This Visit   None Visit Diagnoses     Corn or callus    -  Primary Acute on chronic, ongoing concern Patient reports she is having mild foot discomfort in left foot and thinks something may be stuck in skin  PE was concerning for several areas of callus/ corn formation on foot and side of great toe Reviewed that this can cause foreign body  sensation and pain if allowed to progress Will place referral to podiatry for potential removal and management Recommend keeping feet clean and dry and using moisturizing ointments at night to help reduce skin irritation and dryness Recommend using supportive shoes with plenty of cushioning to help reduce pain and pressure on feet Follow up as needed     Relevant Orders   Ambulatory referral to Podiatry   Callus of foot       Relevant Orders   Ambulatory referral to Podiatry        No follow-ups on file.   I, Kynisha Memon E Diaz Crago, PA-C, have reviewed all documentation for this visit. The documentation on 10/13/22 for the exam, diagnosis, procedures, and orders are all accurate and complete.   Talitha Givens, MHS, PA-C [facility]

## 2022-10-27 ENCOUNTER — Encounter: Payer: Self-pay | Admitting: Podiatry

## 2022-10-27 ENCOUNTER — Ambulatory Visit (INDEPENDENT_AMBULATORY_CARE_PROVIDER_SITE_OTHER): Payer: No Typology Code available for payment source | Admitting: Podiatry

## 2022-10-27 VITALS — BP 149/80 | HR 55

## 2022-10-27 DIAGNOSIS — Q828 Other specified congenital malformations of skin: Secondary | ICD-10-CM

## 2022-10-27 NOTE — Progress Notes (Signed)
Subjective:  Patient ID: Monique Daniels, female    DOB: Mar 29, 1961,  MRN: [id number]  Chief Complaint  Patient presents with   Callouses    "I have something on the bottom of my foot." N - callus L - 4th met D - 4 weeks O - suddenly, gotten worse C - sore A - walk barefoot, wear house slippers, thinner shoes T - lotion, saw my PA    62 y.o. female presents with the above complaint.  Patient presents with left submetatarsal 4 porokeratotic lesion painful to touch is progressive gotten worse rest of the HPI as above   Review of Systems: Negative except as noted in the HPI. Denies N/V/F/Ch.  Past Medical History:  Diagnosis Date   Allergic rhinitis, cause unspecified    Benign neoplasm of skin, site unspecified    Calculus of kidney    stones   Carpal tunnel syndrome    Degeneration of cervical intervertebral disc    Dizziness and giddiness    Enthesopathy of hip region    Esophageal reflux    Essential hypertension, malignant    Irregular menstrual cycle    Migraine, unspecified, without mention of intractable migraine without mention of status migrainosus    Obesity, unspecified    Other dyspnea and respiratory abnormality    Other malaise and fatigue    Ovarian cyst    Palpitations    Symptomatic menopausal or female climacteric states    Transient ischemic attack (TIA), and cerebral infarction without residual deficits(V12.54)    Unspecified vitamin D deficiency     Current Outpatient Medications:    Ascorbic Acid (VITAMIN C) POWD, Take 500 mg by mouth., Disp: , Rfl:    Cholecalciferol (VITAMIN D) 2000 units tablet, Take 2,000 Units by mouth daily., Disp: , Rfl:    Multiple Vitamins-Minerals (CENTRUM SILVER ADULT 50+ PO), Take 1 tablet by mouth daily., Disp: , Rfl:    albuterol (VENTOLIN HFA) 108 (90 Base) MCG/ACT inhaler, TAKE 2 PUFFS BY MOUTH EVERY 6 HOURS AS NEEDED FOR WHEEZE OR SHORTNESS OF BREATH (Patient not taking: Reported on 10/27/2022), Disp: 18 g, Rfl:  1   ALPRAZolam (XANAX) 0.5 MG tablet, Take 1 tablet (0.5 mg total) by mouth daily as needed for anxiety (05-1 tablet). (Patient not taking: Reported on 10/27/2022), Disp: 10 tablet, Rfl: 0   aspirin-acetaminophen-caffeine (EXCEDRIN MIGRAINE) [id number] MG per tablet, Take 2 tablets by mouth every 6 (six) hours as needed for pain. (Patient not taking: Reported on 10/27/2022), Disp: , Rfl:    benzonatate (TESSALON) 100 MG capsule, Take 1 capsule (100 mg total) by mouth 2 (two) times daily as needed for cough. (Patient not taking: Reported on 10/27/2022), Disp: 40 capsule, Rfl: 0   omeprazole (PRILOSEC) 20 MG capsule, Take 1 capsule (20 mg total) by mouth daily. (Patient not taking: Reported on 10/27/2022), Disp: 90 capsule, Rfl: 3   sodium chloride (OCEAN) 0.65 % nasal spray, Place 1 spray into both nostrils daily as needed (for allergies).  (Patient not taking: Reported on 10/27/2022), Disp: , Rfl:    TURMERIC CURCUMIN PO, Take 1 capsule by mouth daily. 900 mg daily (Patient not taking: Reported on 10/27/2022), Disp: , Rfl:   Social History   Tobacco Use  Smoking Status Never  Smokeless Tobacco Never    Allergies  Allergen Reactions   Claritin  [Loratadine] Other (See Comments)   Codeine Other (See Comments)    Severe headache    Entex Lq  [Phenylephrine-Guaifenesin] Other (See Comments)  Fexofenadine Other (See Comments)   Loratadine-Pseudoephedrine Er Other (See Comments)   Pseudoephedrine Other (See Comments)   Tramadol Nausea Only   Objective:   Vitals:   10/27/22 0900  BP: (!) 149/80  Pulse: (!) 55   There is no height or weight on file to calculate BMI. Constitutional Well developed. Well nourished.  Vascular Dorsalis pedis pulses palpable bilaterally. Posterior tibial pulses palpable bilaterally. Capillary refill normal to all digits.  No cyanosis or clubbing noted. Pedal hair growth normal.  Neurologic Normal speech. Oriented to person, place, and time. Epicritic  sensation to light touch grossly present bilaterally.  Dermatologic Left submet 4 porokeratosis pain on palpation.  No pinpoint bleeding noted upon debridement.  Central nucleated core noted.  Orthopedic: Normal joint ROM without pain or crepitus bilaterally. No visible deformities. No bony tenderness.   Radiographs: None Assessment:   1. Porokeratosis    Plan:  Patient was evaluated and treated and all questions answered.  Left submetatarsal 4 porokeratosis -All questions and concerns were discussed with the patient in extensive detail as a courtesy using chisel blade to handle the lesion was debrided down to healthy striated tissue no complication noted no pinpoint bleeding noted. -I discussed shoe gear modification with her in extensive detail I discussed power step insoles as well.  No follow-ups on file.

## 2022-11-20 NOTE — Progress Notes (Unsigned)
Name: Monique Daniels   MRN: [id number]    DOB: 08-05-1960   Date:11/23/2022       Progress Note  Subjective  Chief Complaint  Follow Up  HPI   Low back pain with radiculitis: symptoms started over one year ago. She had MRI 2018  she has been exercising and losing weight and is doing well, very seldom has problems now   History of HTN: no longer taking medication, BP has been at goal most of the time. It was elevated when she went to podiatrist    GERD: under control with medication, no heartburn, no regurgitation since esophageal dilation 2016, she eats a healthy diet, she is only taking Omeprazole prn only    Migraine: hemiplegic, off all medications and doing well, taking Excedrin prn only Episodes are very sporadic now . She states takes Magnesium at night    Metabolic Syndrome:  denies polyphagia, polydipsia or polyuria. She has been going to the gym , eating a healthy diet Had gone down from 5.7 % to normal range at 5.2% . We will recheck labs today    Panic attack: resolved when her step son moved out of the house.   Dyslipidemia and history of TIA: she does not want to take statin therapy, discussed aspirin but she forgets to take it. We will recheck levels.    Patient Active Problem List   Diagnosis Date Noted   Metabolic syndrome 10/10/2018   Cervical spinal stenosis 04/08/2016   Prediabetes 01/09/2016   Dyslipidemia 10/03/2015   Seasonal allergic rhinitis 04/29/2015   History of esophageal stricture 01/28/2015   Atypical nevus 01/27/2015   Carpal tunnel syndrome 01/27/2015   Insomnia, persistent 01/27/2015   DDD (degenerative disc disease), cervical 01/27/2015   Panic attacks 01/27/2015   Gastric reflux 01/27/2015   Headache, hemiplegic migraine 01/27/2015   H/O transient cerebral ischemia 01/27/2015   Hypertension, benign 01/27/2015   Calculus of kidney 01/27/2015   Adult BMI 30+ 01/27/2015   Vitamin D deficiency 01/27/2015   Snores 01/27/2015    Past  Surgical History:  Procedure Laterality Date   HYSTEROSCOPY WITH D & C N/A 02/07/2015   Procedure: DILATATION AND CURETTAGE /HYSTEROSCOPY;  Surgeon: Conard Novak, MD;  Location: [facility];  Service: Gynecology;  Laterality: N/A;   HYSTEROSCOPY WITH NOVASURE N/A 02/07/2015   Procedure: HYSTEROSCOPY WITH NOVASURE;  Surgeon: Conard Novak, MD;  Location: [facility];  Service: Gynecology;  Laterality: N/A;   TOOTH EXTRACTION     TUBAL LIGATION  1989    Family History  Problem Relation Age of Onset   Asthma Mother    Heart disease Mother    Hyperlipidemia Mother    Emphysema Mother    Tremor Mother    Macular degeneration Mother    Asthma Son    Heart attack Father    Cancer Father        Unknown   Urolithiasis Son    Tremor Sister    Tremor Brother    Macular degeneration Maternal Grandmother    Alzheimer's disease Paternal Grandfather    Breast cancer Neg Hx     Social History   Tobacco Use   Smoking status: Never   Smokeless tobacco: Never  Substance Use Topics   Alcohol use: Not Currently    Comment: used to drink wine very seldom      Current Outpatient Medications:    Ascorbic Acid (VITAMIN C) POWD, Take 500 mg by mouth., Disp: , Rfl:  Cholecalciferol (VITAMIN D) 2000 units tablet, Take 2,000 Units by mouth daily., Disp: , Rfl:    omeprazole (PRILOSEC) 20 MG capsule, Take 1 capsule (20 mg total) by mouth daily., Disp: 90 capsule, Rfl: 3   albuterol (VENTOLIN HFA) 108 (90 Base) MCG/ACT inhaler, TAKE 2 PUFFS BY MOUTH EVERY 6 HOURS AS NEEDED FOR WHEEZE OR SHORTNESS OF BREATH (Patient not taking: Reported on 11/23/2022), Disp: 18 g, Rfl: 1   ALPRAZolam (XANAX) 0.5 MG tablet, Take 1 tablet (0.5 mg total) by mouth daily as needed for anxiety (05-1 tablet). (Patient not taking: Reported on 10/27/2022), Disp: 10 tablet, Rfl: 0   aspirin-acetaminophen-caffeine (EXCEDRIN MIGRAINE) 250-250-65 MG per tablet, Take 2 tablets by mouth every 6 (six) hours as needed for pain.  (Patient not taking: Reported on 10/27/2022), Disp: , Rfl:   Allergies  Allergen Reactions   Claritin  [Loratadine] Other (See Comments)   Codeine Other (See Comments)    Severe headache    Entex Lq  [Phenylephrine-Guaifenesin] Other (See Comments)   Fexofenadine Other (See Comments)   Loratadine-Pseudoephedrine Er Other (See Comments)   Pseudoephedrine Other (See Comments)   Tramadol Nausea Only    I personally reviewed active problem list, medication list, allergies, family history, social history, health maintenance with the patient/caregiver today.   ROS  Constitutional: Negative for fever or weight change.  Respiratory: Negative for cough and shortness of breath.   Cardiovascular: Negative for chest pain or palpitations.  Gastrointestinal: Negative for abdominal pain, no bowel changes.  Musculoskeletal: Negative for gait problem or joint swelling.  Skin: Negative for rash.  Neurological: Negative for dizziness , positive intermittently  headache.  No other specific complaints in a complete review of systems (except as listed in HPI above).   Objective  Vitals:   11/23/22 0819  BP: 122/82  Pulse: 83  Resp: 16  SpO2: 95%  Weight: 185 lb (83.9 kg)  Height: 5\' 1"  (1.549 m)    Body mass index is 34.96 kg/m.  Physical Exam  Constitutional: Patient appears well-developed and well-nourished. Obese  No distress.  HEENT: head atraumatic, normocephalic, pupils equal and reactive to light, neck supple Cardiovascular: Normal rate, regular rhythm and normal heart sounds.  No murmur heard. No BLE edema. Pulmonary/Chest: Effort normal and breath sounds normal. No respiratory distress. Abdominal: Soft.  There is no tenderness. Psychiatric: Patient has a normal mood and affect. behavior is normal. Judgment and thought content normal.   PHQ2/9:    11/23/2022    8:18 AM 10/12/2022    3:17 PM 07/08/2022    1:15 PM 11/20/2021    7:44 AM 05/22/2021    7:33 AM  Depression screen  PHQ 2/9  Decreased Interest 0 0 0 0 0  Down, Depressed, Hopeless 0 0 0 0 0  PHQ - 2 Score 0 0 0 0 0  Altered sleeping 0  0 0 0  Tired, decreased energy 0  1 0 0  Change in appetite 0  1 0 0  Feeling bad or failure about yourself  0  0 0 0  Trouble concentrating 0  0 0 0  Moving slowly or fidgety/restless 0  0 0 0  Suicidal thoughts 0  0 0 0  PHQ-9 Score 0  2 0 0  Difficult doing work/chores   Not difficult at all      phq 9 is negative   Fall Risk:    11/23/2022    8:18 AM 10/12/2022    3:17 PM 07/08/2022  1:15 PM 11/20/2021    7:44 AM 05/22/2021    7:32 AM  Fall Risk   Falls in the past year? 0 0 0 0 0  Number falls in past yr: 0 0  0 0  Injury with Fall? 0 0  0 0  Risk for fall due to : No Fall Risks  No Fall Risks No Fall Risks No Fall Risks  Follow up Falls prevention discussed  Falls prevention discussed;Education provided Falls prevention discussed Falls prevention discussed      Functional Status Survey: Is the patient deaf or have difficulty hearing?: No Does the patient have difficulty seeing, even when wearing glasses/contacts?: No Does the patient have difficulty concentrating, remembering, or making decisions?: No Does the patient have difficulty walking or climbing stairs?: No Does the patient have difficulty dressing or bathing?: No Does the patient have difficulty doing errands alone such as visiting a doctor's office or shopping?: No    Assessment & Plan   1. Hemiplegic migraine without status migrainosus, not intractable  Doing well now   2. History of hypertension  - CBC with Differential/Platelet  3. Gastric reflux  - Omeprazole (PRILOSEC) 20 MG capsule; Take 1 capsule (20 mg total) by mouth daily.  Dispense: 90 capsule; Refill: 0  4. Vitamin D deficiency  - VITAMIN D 25 Hydroxy (Vit-D Deficiency, Fractures)  5. Metabolic syndrome  - COMPLETE METABOLIC PANEL WITH GFR - Hemoglobin A1c  6. Dyslipidemia  - Lipid panel  7. History  of TIA (transient ischemic attack)   8. Breast cancer screening by mammogram  - MM 3D SCREENING MAMMOGRAM BILATERAL BREAST; Future  9. Intermittent low back pain    10. Need for Tdap vaccination  - Tdap vaccine greater than or equal to 7yo IM  11. Need for shingles vaccine  - Varicella-zoster vaccine IM

## 2022-11-23 ENCOUNTER — Telehealth: Payer: Self-pay | Admitting: Family Medicine

## 2022-11-23 ENCOUNTER — Encounter: Payer: Self-pay | Admitting: Family Medicine

## 2022-11-23 ENCOUNTER — Ambulatory Visit (INDEPENDENT_AMBULATORY_CARE_PROVIDER_SITE_OTHER): Payer: No Typology Code available for payment source | Admitting: Family Medicine

## 2022-11-23 VITALS — BP 122/82 | HR 83 | Resp 16 | Ht 61.0 in | Wt 185.0 lb

## 2022-11-23 DIAGNOSIS — G43409 Hemiplegic migraine, not intractable, without status migrainosus: Secondary | ICD-10-CM

## 2022-11-23 DIAGNOSIS — E785 Hyperlipidemia, unspecified: Secondary | ICD-10-CM

## 2022-11-23 DIAGNOSIS — Z23 Encounter for immunization: Secondary | ICD-10-CM

## 2022-11-23 DIAGNOSIS — Z8673 Personal history of transient ischemic attack (TIA), and cerebral infarction without residual deficits: Secondary | ICD-10-CM

## 2022-11-23 DIAGNOSIS — Z1231 Encounter for screening mammogram for malignant neoplasm of breast: Secondary | ICD-10-CM

## 2022-11-23 DIAGNOSIS — K219 Gastro-esophageal reflux disease without esophagitis: Secondary | ICD-10-CM

## 2022-11-23 DIAGNOSIS — E8881 Metabolic syndrome: Secondary | ICD-10-CM

## 2022-11-23 DIAGNOSIS — Z8679 Personal history of other diseases of the circulatory system: Secondary | ICD-10-CM

## 2022-11-23 DIAGNOSIS — M545 Low back pain, unspecified: Secondary | ICD-10-CM

## 2022-11-23 DIAGNOSIS — E559 Vitamin D deficiency, unspecified: Secondary | ICD-10-CM

## 2022-11-23 MED ORDER — OMEPRAZOLE 20 MG PO CPDR: 20.0000 mg | DELAYED_RELEASE_CAPSULE | Freq: Every day | ORAL | 0 refills | Status: DC

## 2022-11-23 NOTE — Telephone Encounter (Signed)
Pt mentioned that she is needing her routine mammogram. Wanted to know if your able to put this in now or if you wanted to wait until her cpe in May.

## 2022-11-23 NOTE — Telephone Encounter (Signed)
Mammogram ordered today. Patient aware.

## 2022-11-24 LAB — COMPLETE METABOLIC PANEL WITH GFR
AG Ratio: 1.8 (calc) (ref 1.0–2.5)
ALT: 16 U/L (ref 6–29)
AST: 18 U/L (ref 10–35)
Albumin: 3.8 g/dL (ref 3.6–5.1)
Alkaline phosphatase (APISO): 61 U/L (ref 37–153)
BUN: 19 mg/dL (ref 7–25)
CO2: 27 mmol/L (ref 20–32)
Calcium: 9.2 mg/dL (ref 8.6–10.4)
Chloride: 107 mmol/L (ref 98–110)
Creat: 0.89 mg/dL (ref 0.50–1.05)
Globulin: 2.1 g/dL (calc) (ref 1.9–3.7)
Glucose, Bld: 93 mg/dL (ref 65–99)
Potassium: 4.5 mmol/L (ref 3.5–5.3)
Sodium: 141 mmol/L (ref 135–146)
Total Bilirubin: 0.4 mg/dL (ref 0.2–1.2)
Total Protein: 5.9 g/dL — ABNORMAL LOW (ref 6.1–8.1)
eGFR: 74 mL/min/{1.73_m2} (ref >=60)

## 2022-11-24 LAB — CBC WITH DIFFERENTIAL/PLATELET
Absolute Monocytes: 473 cells/uL (ref 200–950)
Basophils Absolute: 86 cells/uL (ref 0–200)
Basophils Relative: 1 %
Eosinophils Absolute: 232 cells/uL (ref 15–500)
Eosinophils Relative: 2.7 %
HCT: 44.5 % (ref 35.0–45.0)
Hemoglobin: 14.9 g/dL (ref 11.7–15.5)
Lymphs Abs: 1342 cells/uL (ref 850–3900)
MCH: 29.7 pg (ref 27.0–33.0)
MCHC: 33.5 g/dL (ref 32.0–36.0)
MCV: 88.6 fL (ref 80.0–100.0)
MPV: 11 fL (ref 7.5–12.5)
Monocytes Relative: 5.5 %
Neutro Abs: 6467 cells/uL (ref 1500–7800)
Neutrophils Relative %: 75.2 %
Platelets: 274 10*3/uL (ref 140–400)
RBC: 5.02 10*6/uL (ref 3.80–5.10)
RDW: 12.2 % (ref 11.0–15.0)
Total Lymphocyte: 15.6 %
WBC: 8.6 10*3/uL (ref 3.8–10.8)

## 2022-11-24 LAB — VITAMIN D 25 HYDROXY (VIT D DEFICIENCY, FRACTURES): Vit D, 25-Hydroxy: 28 ng/mL — ABNORMAL LOW (ref 30–100)

## 2022-11-24 LAB — LIPID PANEL
Cholesterol: 218 mg/dL — ABNORMAL HIGH (ref <200)
HDL: 64 mg/dL (ref >=50)
LDL Cholesterol (Calc): 133 mg/dL (calc) — ABNORMAL HIGH
Non-HDL Cholesterol (Calc): 154 mg/dL (calc) — ABNORMAL HIGH (ref <130)
Total CHOL/HDL Ratio: 3.4 (calc) (ref <5.0)
Triglycerides: 105 mg/dL (ref <150)

## 2022-11-24 LAB — HEMOGLOBIN A1C
Hgb A1c MFr Bld: 5.5 % of total Hgb (ref <5.7)
Mean Plasma Glucose: 111 mg/dL
eAG (mmol/L): 6.2 mmol/L

## 2022-12-08 NOTE — Patient Instructions (Signed)
Preventive Care 60-60 Years Old, Female Preventive care refers to lifestyle choices and visits with your health care provider that can promote health and wellness. Preventive care visits are also called wellness exams. What can I expect for my preventive care visit? Counseling Your health care provider may ask you questions about your: Medical history, including: Past medical problems. Family medical history. Pregnancy history. Current health, including: Menstrual cycle. Method of birth control. Emotional well-being. Home life and relationship well-being. Sexual activity and sexual health. Lifestyle, including: Alcohol, nicotine or tobacco, and drug use. Access to firearms. Diet, exercise, and sleep habits. Work and work environment. Sunscreen use. Safety issues such as seatbelt and bike helmet use. Physical exam Your health care provider will check your: Height and weight. These may be used to calculate your BMI (body mass index). BMI is a measurement that tells if you are at a healthy weight. Waist circumference. This measures the distance around your waistline. This measurement also tells if you are at a healthy weight and may help predict your risk of certain diseases, such as type 2 diabetes and high blood pressure. Heart rate and blood pressure. Body temperature. Skin for abnormal spots. What immunizations do I need?  Vaccines are usually given at various ages, according to a schedule. Your health care provider will recommend vaccines for you based on your age, medical history, and lifestyle or other factors, such as travel or where you work. What tests do I need? Screening Your health care provider may recommend screening tests for certain conditions. This may include: Lipid and cholesterol levels. Diabetes screening. This is done by checking your blood sugar (glucose) after you have not eaten for a while (fasting). Pelvic exam and Pap test. Hepatitis B test. Hepatitis C  test. HIV (human immunodeficiency virus) test. STI (sexually transmitted infection) testing, if you are at risk. Lung cancer screening. Colorectal cancer screening. Mammogram. Talk with your health care provider about when you should start having regular mammograms. This may depend on whether you have a family history of breast cancer. BRCA-related cancer screening. This may be done if you have a family history of breast, ovarian, tubal, or peritoneal cancers. Bone density scan. This is done to screen for osteoporosis. Talk with your health care provider about your test results, treatment options, and if necessary, the need for more tests. Follow these instructions at home: Eating and drinking  Eat a diet that includes fresh fruits and vegetables, whole grains, lean protein, and low-fat dairy products. Take vitamin and mineral supplements as recommended by your health care provider. Do not drink alcohol if: Your health care provider tells you not to drink. You are pregnant, may be pregnant, or are planning to become pregnant. If you drink alcohol: Limit how much you have to 0-1 drink a day. Know how much alcohol is in your drink. In the [place] one drink equals one 12 oz bottle of beer (355 mL), one 5 oz glass of wine (148 mL), or one 1 oz glass of hard liquor (44 mL). Lifestyle Brush your teeth every morning and night with fluoride toothpaste. Floss one time each day. Exercise for at least 30 minutes 5 or more days each week. Do not use any products that contain nicotine or tobacco. These products include cigarettes, chewing tobacco, and vaping devices, such as e-cigarettes. If you need help quitting, ask your health care provider. Do not use drugs. If you are sexually active, practice safe sex. Use a condom or other form of protection to   prevent STIs. If you do not wish to become pregnant, use a form of birth control. If you plan to become pregnant, see your health care provider for a  prepregnancy visit. Take aspirin only as told by your health care provider. Make sure that you understand how much to take and what form to take. Work with your health care provider to find out whether it is safe and beneficial for you to take aspirin daily. Find healthy ways to manage stress, such as: Meditation, yoga, or listening to music. Journaling. Talking to a trusted person. Spending time with friends and family. Minimize exposure to UV radiation to reduce your risk of skin cancer. Safety Always wear your seat belt while driving or riding in a vehicle. Do not drive: If you have been drinking alcohol. Do not ride with someone who has been drinking. When you are tired or distracted. While texting. If you have been using any mind-altering substances or drugs. Wear a helmet and other protective equipment during sports activities. If you have firearms in your house, make sure you follow all gun safety procedures. Seek help if you have been physically or sexually abused. What's next? Visit your health care provider once a year for an annual wellness visit. Ask your health care provider how often you should have your eyes and teeth checked. Stay up to date on all vaccines. This information is not intended to replace advice given to you by your health care provider. Make sure you discuss any questions you have with your health care provider. Document Revised: 01/15/2021 Document Reviewed: 01/15/2021 [employer] Patient Education  2023 [employer]  

## 2022-12-08 NOTE — Progress Notes (Unsigned)
Name: Monique Daniels   MRN: [id number]    DOB: 30-Dec-1960   Date:12/09/2022       Progress Note  Subjective  Chief Complaint  Annual Exam  HPI  Patient presents for annual CPE.  HTN: she stopped bp medication years ago because it improved when she became more physically active and lost some weight, but bp has been elevated twice over the past 2 months, discussed risk of elevated bp and importance of resuming medication . She states doing more weight training than cardio lately   Diet: she has balanced meals, likes fish, [place], chicken, fruit and vegetables Exercise:  continue regular activity  Last Eye Exam: up to date Last Dental Exam: up to date   [employer] Visit from 10/12/2022 in [facility]  AUDIT-C Score 0      Depression: Phq 9 is  negative    12/09/2022    9:25 AM 11/23/2022    8:18 AM 10/12/2022    3:17 PM 07/08/2022    1:15 PM 11/20/2021    7:44 AM  Depression screen PHQ 2/9  Decreased Interest 0 0 0 0 0  Down, Depressed, Hopeless 0 0 0 0 0  PHQ - 2 Score 0 0 0 0 0  Altered sleeping 0 0  0 0  Tired, decreased energy 0 0  1 0  Change in appetite 0 0  1 0  Feeling bad or failure about yourself  0 0  0 0  Trouble concentrating 0 0  0 0  Moving slowly or fidgety/restless 0 0  0 0  Suicidal thoughts 0 0  0 0  PHQ-9 Score 0 0  2 0  Difficult doing work/chores    Not difficult at all    Hypertension: BP Readings from Last 3 Encounters:  12/09/22 (!) 150/82  11/23/22 122/82  10/27/22 (!) 149/80   Obesity: Wt Readings from Last 3 Encounters:  12/09/22 183 lb (83 kg)  11/23/22 185 lb (83.9 kg)  10/12/22 187 lb 8 oz (85 kg)   BMI Readings from Last 3 Encounters:  12/09/22 34.58 kg/m  11/23/22 34.96 kg/m  10/12/22 35.43 kg/m     Vaccines:   RSV: discussed with patient  Tdap: up to date Shingrix: up to date Pneumonia: up to date  Flu: 2022 COVID-19: up to date   Hep C Screening: 07/06/17 STD testing and  prevention (HIV/chl/gon/syphilis): 01/21/03 Intimate partner violence: negative screen  Sexual History :vaginal dryness uses lubrication, she has lack of libido  Menstrual History/LMP/Abnormal Bleeding: post-menopausal  Discussed importance of follow up if any post-menopausal bleeding: yes  Incontinence Symptoms: negative for symptoms   Breast cancer:  - Last Mammogram: Scheduled for 12/17/22 - BRCA gene screening: N/A  Osteoporosis Prevention : Discussed high calcium and vitamin D supplementation, weight bearing exercises Bone density: N/A  Cervical cancer screening: 05/22/21  Skin cancer: Discussed monitoring for atypical lesions  Colorectal cancer: 07/17/14  , family history of colon cancer  Lung cancer:  Low Dose CT Chest recommended if Age 51-80 years, 20 pack-year currently smoking OR have quit w/in 15years. Patient does not qualify for screen   ECG: 07/06/17  Advanced Care Planning: A voluntary discussion about advance care planning including the explanation and discussion of advance directives.  Discussed health care proxy and Living will, and the patient was able to identify a health care proxy as husband .  Patient does not have a living will and power of attorney of health care  Lipids: Lab Results  Component Value Date   CHOL 218 (H) 11/23/2022   CHOL 183 03/26/2021   CHOL 198 10/13/2019   Lab Results  Component Value Date   HDL 64 11/23/2022   HDL 56 03/26/2021   HDL 59 10/13/2019   Lab Results  Component Value Date   LDLCALC 133 (H) 11/23/2022   LDLCALC 113 (H) 03/26/2021   LDLCALC 125 (H) 10/13/2019   Lab Results  Component Value Date   TRIG 105 11/23/2022   TRIG 53 03/26/2021   TRIG 51 10/13/2019   Lab Results  Component Value Date   CHOLHDL 3.4 11/23/2022   CHOLHDL 3.3 03/26/2021   CHOLHDL 3.4 10/13/2019   No results found for: "LDLDIRECT"  Glucose: Glucose  Date Value Ref Range Status  01/18/2013 88 65 - 99 mg/dL Final  09/81/1914 782 (H) 65  - 99 mg/dL Final  95/62/1308 96 65 - 99 mg/dL Final   Glucose, Bld  Date Value Ref Range Status  11/23/2022 93 65 - 99 mg/dL Final    Comment:    .            Fasting reference interval .   03/26/2021 86 65 - 99 mg/dL Final    Comment:    .            Fasting reference interval .   10/13/2019 77 65 - 99 mg/dL Final    Comment:    .            Fasting reference interval .    Glucose-Capillary  Date Value Ref Range Status  05/05/2013 80 70 - 99 mg/dL Final    Patient Active Problem List   Diagnosis Date Noted   Metabolic syndrome 10/10/2018   Cervical spinal stenosis 04/08/2016   Prediabetes 01/09/2016   Dyslipidemia 10/03/2015   Seasonal allergic rhinitis 04/29/2015   History of esophageal stricture 01/28/2015   Atypical nevus 01/27/2015   Carpal tunnel syndrome 01/27/2015   Insomnia, persistent 01/27/2015   DDD (degenerative disc disease), cervical 01/27/2015   Panic attacks 01/27/2015   Gastric reflux 01/27/2015   Headache, hemiplegic migraine 01/27/2015   H/O transient cerebral ischemia 01/27/2015   Hypertension, benign 01/27/2015   Calculus of kidney 01/27/2015   Adult BMI 30+ 01/27/2015   Vitamin D deficiency 01/27/2015   Snores 01/27/2015    Past Surgical History:  Procedure Laterality Date   HYSTEROSCOPY WITH D & C N/A 02/07/2015   Procedure: DILATATION AND CURETTAGE /HYSTEROSCOPY;  Surgeon: Conard Novak, MD;  Location: [facility] ORS;  Service: Gynecology;  Laterality: N/A;   HYSTEROSCOPY WITH NOVASURE N/A 02/07/2015   Procedure: HYSTEROSCOPY WITH NOVASURE;  Surgeon: Conard Novak, MD;  Location: [facility] ORS;  Service: Gynecology;  Laterality: N/A;   TOOTH EXTRACTION     TUBAL LIGATION  1989    Family History  Problem Relation Age of Onset   Asthma Mother    Heart disease Mother    Hyperlipidemia Mother    Emphysema Mother    Tremor Mother    Macular degeneration Mother    Asthma Son    Heart attack Father    Cancer Father        Unknown    Urolithiasis Son    Tremor Sister    Tremor Brother    Macular degeneration Maternal Grandmother    Alzheimer's disease Paternal Grandfather    Breast cancer Neg Hx     Social History   Socioeconomic History   Marital status:  Married    Spouse name: Trey Paula   Number of children: 2   Years of education: 12th   Highest education level: 12th grade  Occupational History   Occupation: Medical records    Employer: [employer] research [employer]  Tobacco Use   Smoking status: Never   Smokeless tobacco: Never  Vaping Use   Vaping Use: Never used  Substance and Sexual Activity   Alcohol use: Not Currently    Comment: used to drink wine very seldom    Drug use: No   Sexual activity: Yes    Partners: Male    Comment: BTL  Other Topics Concern   Not on file  Social History Narrative   Patient lives at home with husband Trey Paula.    Patient works at [employer].    Patient has a high school education.    Patient has 2 children.    Social Determinants of Health   Financial Resource Strain: Low Risk  (12/09/2022)   Overall Financial Resource Strain (CARDIA)    Difficulty of Paying Living Expenses: Not hard at all  Food Insecurity: No Food Insecurity (12/09/2022)   Hunger Vital Sign    Worried About Running Out of Food in the Last Year: Never true    Ran Out of Food in the Last Year: Never true  Transportation Needs: No Transportation Needs (12/09/2022)   PRAPARE - Administrator, Civil Service (Medical): No    Lack of Transportation (Non-Medical): No  Physical Activity: Sufficiently Active (12/09/2022)   Exercise Vital Sign    Days of Exercise per Week: 5 days    Minutes of Exercise per Session: 90 min  Stress: No Stress Concern Present (12/09/2022)   [employer] of Occupational Health - Occupational Stress Questionnaire    Feeling of Stress : Not at all  Social Connections: Moderately Integrated (12/09/2022)   Social Connection and Isolation Panel [NHANES]     Frequency of Communication with Friends and Family: More than three times a week    Frequency of Social Gatherings with Friends and Family: More than three times a week    Attends Religious Services: More than 4 times per year    Active Member of [employer] or Organizations: No    Attends Banker Meetings: Never    Marital Status: Married  Catering manager Violence: Not At Risk (12/09/2022)   Humiliation, Afraid, Rape, and Kick questionnaire    Fear of Current or Ex-Partner: No    Emotionally Abused: No    Physically Abused: No    Sexually Abused: No     Current Outpatient Medications:    Ascorbic Acid (VITAMIN C) POWD, Take 500 mg by mouth., Disp: , Rfl:    aspirin-acetaminophen-caffeine (EXCEDRIN MIGRAINE) 250-250-65 MG per tablet, Take 2 tablets by mouth every 6 (six) hours as needed for pain., Disp: , Rfl:    Cholecalciferol (VITAMIN D) 2000 units tablet, Take 2,000 Units by mouth daily., Disp: , Rfl:    cyanocobalamin (VITAMIN B12) 500 MCG tablet, Take 500 mcg by mouth daily., Disp: , Rfl:    omeprazole (PRILOSEC) 20 MG capsule, Take 1 capsule (20 mg total) by mouth daily., Disp: 90 capsule, Rfl: 0   pyridoxine (B-6) 500 MG tablet, Take 500 mg by mouth daily., Disp: , Rfl:   Allergies  Allergen Reactions   Claritin  [Loratadine] Other (See Comments)   Codeine Other (See Comments)    Severe headache    Entex Lq  [Phenylephrine-Guaifenesin] Other (See Comments)  Fexofenadine Other (See Comments)   Loratadine-Pseudoephedrine Er Other (See Comments)   Pseudoephedrine Other (See Comments)   Tramadol Nausea Only     ROS  Constitutional: Negative for fever or weight change.  Respiratory: Negative for cough and shortness of breath.   Cardiovascular: Negative for chest pain or palpitations.  Gastrointestinal: Negative for abdominal pain, no bowel changes.  Musculoskeletal: Negative for gait problem or joint swelling.  Skin: Negative for rash. New lesion on her face   Neurological: Negative for dizziness or headache.  No other specific complaints in a complete review of systems (except as listed in HPI above).   Objective  Vitals:   12/09/22 0922  BP: (!) 150/82  Pulse: 68  Resp: 14  Temp: 97.8 F (36.6 C)  TempSrc: Oral  Weight: 183 lb (83 kg)  Height: 5\' 1"  (1.549 m)    Body mass index is 34.58 kg/m.  Physical Exam  Constitutional: Patient appears well-developed and well-nourished. No distress.  HENT: Head: Normocephalic and atraumatic. Ears: B TMs ok, no erythema or effusion; Nose: Nose normal. Mouth/Throat: Oropharynx is clear and moist. No oropharyngeal exudate.  Eyes: Conjunctivae and EOM are normal. Pupils are equal, round, and reactive to light. No scleral icterus.  Neck: Normal range of motion. Neck supple. No JVD present. No thyromegaly present.  Cardiovascular: Normal rate, regular rhythm and normal heart sounds.  No murmur heard. No BLE edema. Pulmonary/Chest: Effort normal and breath sounds normal. No respiratory distress. Abdominal: Soft. Bowel sounds are normal, no distension. There is no tenderness. no masses Breast: no lumps or masses, no nipple discharge or rashes FEMALE GENITALIA:  Not done  RECTAL: not done  Musculoskeletal: Normal range of motion, no joint effusions. No gross deformities Neurological: he is alert and oriented to person, place, and time. No cranial nerve deficit. Coordination, balance, strength, speech and gait are normal.  Skin: Smoles, red papule on left zygomatic area Psychiatric: Patient has a normal mood and affect. behavior is normal. Judgment and thought content normal.   Recent Results (from the past 2160 hour(s))  Lipid panel     Status: Abnormal   Collection Time: 11/23/22  9:20 AM  Result Value Ref Range   Cholesterol 218 (H) <200 mg/dL   HDL 64 > OR = 50 mg/dL   Triglycerides 161 <096 mg/dL   LDL Cholesterol (Calc) 133 (H) mg/dL (calc)    Comment: Reference range: <100 . Desirable  range <100 mg/dL for primary prevention;   <70 mg/dL for patients with CHD or diabetic patients  with > or = 2 CHD risk factors. Marland Kitchen LDL-C is now calculated using the Martin-Hopkins  calculation, which is a validated novel method providing  better accuracy than the Friedewald equation in the  estimation of LDL-C.  Horald Pollen et al. Lenox Ahr. 0454;098(11): 2061-2068  (http://education.[facility].com/faq/FAQ164)    Total CHOL/HDL Ratio 3.4 <5.0 (calc)   Non-HDL Cholesterol (Calc) 154 (H) <130 mg/dL (calc)    Comment: For patients with diabetes plus 1 major ASCVD risk  factor, treating to a non-HDL-C goal of <100 mg/dL  (LDL-C of <91 mg/dL) is considered a therapeutic  option.   COMPLETE METABOLIC PANEL WITH GFR     Status: Abnormal   Collection Time: 11/23/22  9:20 AM  Result Value Ref Range   Glucose, Bld 93 65 - 99 mg/dL    Comment: .            Fasting reference interval .    BUN 19 7 - 25 mg/dL  Creat 0.89 0.50 - 1.05 mg/dL   eGFR 74 > OR = 60 ZO/XWR/6.04V4   BUN/Creatinine Ratio SEE NOTE: 6 - 22 (calc)    Comment:    Not Reported: BUN and Creatinine are within    reference range. .    Sodium 141 135 - 146 mmol/L   Potassium 4.5 3.5 - 5.3 mmol/L   Chloride 107 98 - 110 mmol/L   CO2 27 20 - 32 mmol/L   Calcium 9.2 8.6 - 10.4 mg/dL   Total Protein 5.9 (L) 6.1 - 8.1 g/dL   Albumin 3.8 3.6 - 5.1 g/dL   Globulin 2.1 1.9 - 3.7 g/dL (calc)   AG Ratio 1.8 1.0 - 2.5 (calc)   Total Bilirubin 0.4 0.2 - 1.2 mg/dL   Alkaline phosphatase (APISO) 61 37 - 153 U/L   AST 18 10 - 35 U/L   ALT 16 6 - 29 U/L  CBC with Differential/Platelet     Status: None   Collection Time: 11/23/22  9:20 AM  Result Value Ref Range   WBC 8.6 3.8 - 10.8 Thousand/uL   RBC 5.02 3.80 - 5.10 Million/uL   Hemoglobin 14.9 11.7 - 15.5 g/dL   HCT 09.8 11.9 - 14.7 %   MCV 88.6 80.0 - 100.0 fL   MCH 29.7 27.0 - 33.0 pg   MCHC 33.5 32.0 - 36.0 g/dL   RDW 82.9 56.2 - 13.0 %   Platelets 274 140 - 400  Thousand/uL   MPV 11.0 7.5 - 12.5 fL   Neutro Abs 6,467 1,500 - 7,800 cells/uL   Lymphs Abs 1,342 850 - 3,900 cells/uL   Absolute Monocytes 473 200 - 950 cells/uL   Eosinophils Absolute 232 15 - 500 cells/uL   Basophils Absolute 86 0 - 200 cells/uL   Neutrophils Relative % 75.2 %   Total Lymphocyte 15.6 %   Monocytes Relative 5.5 %   Eosinophils Relative 2.7 %   Basophils Relative 1.0 %  VITAMIN D 25 Hydroxy (Vit-D Deficiency, Fractures)     Status: Abnormal   Collection Time: 11/23/22  9:20 AM  Result Value Ref Range   Vit D, 25-Hydroxy 28 (L) 30 - 100 ng/mL    Comment: Vitamin D Status         25-OH Vitamin D: . Deficiency:                    <20 ng/mL Insufficiency:             20 - 29 ng/mL Optimal:                 > or = 30 ng/mL . For 25-OH Vitamin D testing on patients on  D2-supplementation and patients for whom quantitation  of D2 and D3 fractions is required, the QuestAssureD(TM) 25-OH VIT D, (D2,D3), LC/MS/MS is recommended: order  code 86578 (patients >56yrs). . See Note 1 . Note 1 . For additional information, please refer to  http://education.[facility].com/faq/FAQ199  (This link is being provided for informational/ educational purposes only.)   Hemoglobin A1c     Status: None   Collection Time: 11/23/22  9:20 AM  Result Value Ref Range   Hgb A1c MFr Bld 5.5 <5.7 % of total Hgb    Comment: For the purpose of screening for the presence of diabetes: . <5.7%       Consistent with the absence of diabetes 5.7-6.4%    Consistent with increased risk for diabetes             (  prediabetes) > or =6.5%  Consistent with diabetes . This assay result is consistent with a decreased risk of diabetes. . Currently, no consensus exists regarding use of hemoglobin A1c for diagnosis of diabetes in children. . According to American Diabetes Association (ADA) guidelines, hemoglobin A1c <7.0% represents optimal control in non-pregnant diabetic patients.  Different metrics may apply to specific patient populations.  Standards of Medical Care in Diabetes(ADA). .    Mean Plasma Glucose 111 mg/dL   eAG (mmol/L) 6.2 mmol/L    Comment: . This test was performed on the Roche cobas c503 platform. Effective 05/11/22, a change in test platforms from the Abbott Architect to the Roche cobas c503 may have shifted HbA1c results compared to historical results. Based on laboratory validation testing conducted at [facility], the Roche platform relative to the Abbott platform had an average increase in HbA1c value of < or = 0.3%. This difference is within accepted  variability established by the [facility]. Note that not all individuals will have had a shift in their results and direct comparisons between historical and current results for testing conducted on different platforms is not recommended.      Fall Risk:    12/09/2022    9:25 AM 11/23/2022    8:18 AM 10/12/2022    3:17 PM 07/08/2022    1:15 PM 11/20/2021    7:44 AM  Fall Risk   Falls in the past year? 0 0 0 0 0  Number falls in past yr:  0 0  0  Injury with Fall?  0 0  0  Risk for fall due to : No Fall Risks No Fall Risks  No Fall Risks No Fall Risks  Follow up Falls prevention discussed;Education provided;Falls evaluation completed Falls prevention discussed  Falls prevention discussed;Education provided Falls prevention discussed     Functional Status Survey: Is the patient deaf or have difficulty hearing?: No Does the patient have difficulty seeing, even when wearing glasses/contacts?: No Does the patient have difficulty concentrating, remembering, or making decisions?: No Does the patient have difficulty walking or climbing stairs?: No Does the patient have difficulty dressing or bathing?: No Does the patient have difficulty doing errands alone such as visiting a doctor's office or shopping?: No   Assessment & Plan  1. Well adult  exam  Continue regular physical activity   2. Family history of colon cancer  - Ambulatory referral to Gastroenterology  3. Colon cancer screening  - Ambulatory referral to Gastroenterology  4. Facial lesion  - Ambulatory referral to Dermatology  5. Family history of melanoma  - Ambulatory referral to Dermatology   6. Hypertension, benign  - hydrochlorothiazide (HYDRODIURIL) 12.5 MG tablet; Take 1 tablet (12.5 mg total) by mouth daily.  Dispense: 90 tablet; Refill: 0   She will take medication at lunch since she goes to the gym early am and is afraid of feeling tired   -USPSTF grade A and B recommendations reviewed with patient; age-appropriate recommendations, preventive care, screening tests, etc discussed and encouraged; healthy living encouraged; see AVS for patient education given to patient -Discussed importance of 150 minutes of physical activity weekly, eat two servings of fish weekly, eat one serving of tree nuts ( cashews, pistachios, pecans, almonds.Marland Kitchen) every other day, eat 6 servings of fruit/vegetables daily and drink plenty of water and avoid sweet beverages.   -Reviewed Health Maintenance: Yes.

## 2022-12-09 ENCOUNTER — Encounter: Payer: Self-pay | Admitting: Family Medicine

## 2022-12-09 ENCOUNTER — Ambulatory Visit (INDEPENDENT_AMBULATORY_CARE_PROVIDER_SITE_OTHER): Payer: No Typology Code available for payment source | Admitting: Family Medicine

## 2022-12-09 VITALS — BP 138/72 | HR 68 | Temp 97.8°F | Resp 14 | Ht 61.0 in | Wt 183.0 lb

## 2022-12-09 DIAGNOSIS — I10 Essential (primary) hypertension: Secondary | ICD-10-CM

## 2022-12-09 DIAGNOSIS — Z808 Family history of malignant neoplasm of other organs or systems: Secondary | ICD-10-CM

## 2022-12-09 DIAGNOSIS — Z800 Family history of malignant neoplasm of digestive organs: Secondary | ICD-10-CM | POA: Diagnosis not present

## 2022-12-09 DIAGNOSIS — Z1211 Encounter for screening for malignant neoplasm of colon: Secondary | ICD-10-CM | POA: Diagnosis not present

## 2022-12-09 DIAGNOSIS — L989 Disorder of the skin and subcutaneous tissue, unspecified: Secondary | ICD-10-CM | POA: Diagnosis not present

## 2022-12-09 DIAGNOSIS — Z0000 Encounter for general adult medical examination without abnormal findings: Secondary | ICD-10-CM

## 2022-12-09 MED ORDER — HYDROCHLOROTHIAZIDE 12.5 MG PO TABS
12.5000 mg | ORAL_TABLET | Freq: Every day | ORAL | 0 refills | Status: DC
Start: 2022-12-09 — End: 2023-03-16

## 2022-12-17 ENCOUNTER — Ambulatory Visit
Admission: RE | Admit: 2022-12-17 | Discharge: 2022-12-17 | Disposition: A | Discharge status: Home / Self Care | Payer: PRIVATE HEALTH INSURANCE | Source: Ambulatory Visit | Attending: Family Medicine | Admitting: Family Medicine

## 2022-12-17 DIAGNOSIS — Z1231 Encounter for screening mammogram for malignant neoplasm of breast: Secondary | ICD-10-CM | POA: Diagnosis not present

## 2023-02-19 ENCOUNTER — Ambulatory Visit: Payer: No Typology Code available for payment source | Admitting: Family Medicine

## 2023-03-15 NOTE — Progress Notes (Unsigned)
Name: Monique Daniels   MRN: [id number]    DOB: May 05, 1961   Date:03/16/2023       Progress Note  Subjective  Chief Complaint  Follow Up  HPI  Low back pain with radiculitis: symptoms started over one year ago. She had MRI 2018  she has been exercising and has been doing well, no recent episodes. She woke up with her left side of neck being sore   HTN: she was able to stop taking BP medication for a period of time, but BP spiked again and we resumed hydrochlorothiazide in April 2024 , BP today is at goal, no side effects, no chest pain or palpitation    GERD: regurgitation since esophageal dilation 2016, she eats a healthy diet, she is only taking Omeprazole prn only , usually with dietary indiscretion.    Migraine: hemiplegic, off all medications and doing well, taking Excedrin prn only Episodes are very sporadic now . She states takes Magnesium at night and doing well    Metabolic Syndrome:  denies polyphagia, polydipsia or polyuria. She has been going to the gym , eating a healthy diet . Last A1C was at goal .  Dyslipidemia and history of TIA: she does not want to take statin therapy, she does not take aspirin either, but she will consider getting it    Patient Active Problem List   Diagnosis Date Noted   Metabolic syndrome 10/10/2018   Cervical spinal stenosis 04/08/2016   Prediabetes 01/09/2016   Dyslipidemia 10/03/2015   Seasonal allergic rhinitis 04/29/2015   History of esophageal stricture 01/28/2015   Atypical nevus 01/27/2015   Carpal tunnel syndrome 01/27/2015   Insomnia, persistent 01/27/2015   DDD (degenerative disc disease), cervical 01/27/2015   Panic attacks 01/27/2015   Gastric reflux 01/27/2015   Headache, hemiplegic migraine 01/27/2015   H/O transient cerebral ischemia 01/27/2015   Hypertension, benign 01/27/2015   Calculus of kidney 01/27/2015   Adult BMI 30+ 01/27/2015   Vitamin D deficiency 01/27/2015   Snores 01/27/2015    Past Surgical History:   Procedure Laterality Date   HYSTEROSCOPY WITH D & C N/A 02/07/2015   Procedure: DILATATION AND CURETTAGE /HYSTEROSCOPY;  Surgeon: Conard Novak, MD;  Location: [facility];  Service: Gynecology;  Laterality: N/A;   HYSTEROSCOPY WITH NOVASURE N/A 02/07/2015   Procedure: HYSTEROSCOPY WITH NOVASURE;  Surgeon: Conard Novak, MD;  Location: [facility];  Service: Gynecology;  Laterality: N/A;   TOOTH EXTRACTION     TUBAL LIGATION  1989    Family History  Problem Relation Age of Onset   Asthma Mother    Heart disease Mother    Hyperlipidemia Mother    Emphysema Mother    Tremor Mother    Macular degeneration Mother    Asthma Son    Heart attack Father    Cancer Father        Unknown   Urolithiasis Son    Tremor Sister    Tremor Brother    Macular degeneration Maternal Grandmother    Alzheimer's disease Paternal Grandfather    Breast cancer Neg Hx     Social History   Tobacco Use   Smoking status: Never   Smokeless tobacco: Never  Substance Use Topics   Alcohol use: Not Currently    Comment: used to drink wine very seldom      Current Outpatient Medications:    Ascorbic Acid (VITAMIN C) POWD, Take 500 mg by mouth., Disp: , Rfl:    aspirin-acetaminophen-caffeine (EXCEDRIN MIGRAINE)  250-250-65 MG per tablet, Take 2 tablets by mouth every 6 (six) hours as needed for pain., Disp: , Rfl:    Cholecalciferol (VITAMIN D) 2000 units tablet, Take 2,000 Units by mouth daily., Disp: , Rfl:    cyanocobalamin (VITAMIN B12) 500 MCG tablet, Take 500 mcg by mouth daily., Disp: , Rfl:    hydrochlorothiazide (HYDRODIURIL) 12.5 MG tablet, Take 1 tablet (12.5 mg total) by mouth daily., Disp: 90 tablet, Rfl: 0   omeprazole (PRILOSEC) 20 MG capsule, Take 1 capsule (20 mg total) by mouth daily., Disp: 90 capsule, Rfl: 0   pyridoxine (B-6) 500 MG tablet, Take 500 mg by mouth daily., Disp: , Rfl:   Allergies  Allergen Reactions   Claritin  [Loratadine] Other (See Comments)   Codeine Other (See  Comments)    Severe headache    Entex Lq  [Phenylephrine-Guaifenesin] Other (See Comments)   Fexofenadine Other (See Comments)   Loratadine-Pseudoephedrine Er Other (See Comments)   Pseudoephedrine Other (See Comments)   Tramadol Nausea Only    I personally reviewed active problem list, medication list, allergies, family history, social history, health maintenance with the patient/caregiver today.   ROS  Constitutional: Negative for fever or weight change.  Respiratory: Negative for cough and shortness of breath.   Cardiovascular: Negative for chest pain or palpitations.  Gastrointestinal: Negative for abdominal pain, no bowel changes.  Musculoskeletal: Negative for gait problem or joint swelling.  Skin: Negative for rash.  Neurological: Negative for dizziness or headache.  No other specific complaints in a complete review of systems (except as listed in HPI above).   Objective  Vitals:   03/16/23 0853  BP: 118/76  Pulse: 79  Resp: 16  SpO2: 97%  Weight: 189 lb (85.7 kg)  Height: 5\' 1"  (1.549 m)    Body mass index is 35.71 kg/m.  Physical Exam  Constitutional: Patient appears well-developed and well-nourished. No distress.  HEENT: head atraumatic, normocephalic, pupils equal and reactive to light, neck supple, throat within normal limits Cardiovascular: Normal rate, regular rhythm and normal heart sounds.  No murmur heard. No BLE edema. Pulmonary/Chest: Effort normal and breath sounds normal. No respiratory distress. Abdominal: Soft.  There is no tenderness. Psychiatric: Patient has a normal mood and affect. behavior is normal. Judgment and thought content normal.    PHQ2/9:    03/16/2023    8:52 AM 12/09/2022    9:25 AM 11/23/2022    8:18 AM 10/12/2022    3:17 PM 07/08/2022    1:15 PM  Depression screen PHQ 2/9  Decreased Interest 0 0 0 0 0  Down, Depressed, Hopeless 0 0 0 0 0  PHQ - 2 Score 0 0 0 0 0  Altered sleeping 0 0 0  0  Tired, decreased energy 0 0 0  1   Change in appetite 0 0 0  1  Feeling bad or failure about yourself  0 0 0  0  Trouble concentrating 0 0 0  0  Moving slowly or fidgety/restless 0 0 0  0  Suicidal thoughts 0 0 0  0  PHQ-9 Score 0 0 0  2  Difficult doing work/chores     Not difficult at all    phq 9 is negative   Fall Risk:    03/16/2023    8:52 AM 12/09/2022    9:25 AM 11/23/2022    8:18 AM 10/12/2022    3:17 PM 07/08/2022    1:15 PM  Fall Risk   Falls in the  past year? 0 0 0 0 0  Number falls in past yr: 0  0 0   Injury with Fall? 0  0 0   Risk for fall due to : No Fall Risks No Fall Risks No Fall Risks  No Fall Risks  Follow up Falls prevention discussed Falls prevention discussed;Education provided;Falls evaluation completed Falls prevention discussed  Falls prevention discussed;Education provided      Functional Status Survey: Is the patient deaf or have difficulty hearing?: No Does the patient have difficulty seeing, even when wearing glasses/contacts?: No Does the patient have difficulty concentrating, remembering, or making decisions?: No Does the patient have difficulty walking or climbing stairs?: No Does the patient have difficulty dressing or bathing?: No Does the patient have difficulty doing errands alone such as visiting a doctor's office or shopping?: No    Assessment & Plan  1. Hypertension, benign  - hydrochlorothiazide (HYDRODIURIL) 12.5 MG tablet; Take 1 tablet (12.5 mg total) by mouth daily.  Dispense: 90 tablet; Refill: 3  2. Gastric reflux  - omeprazole (PRILOSEC) 20 MG capsule; Take 1 capsule (20 mg total) by mouth daily.  Dispense: 90 capsule; Refill: 3  3. Hemiplegic migraine without status migrainosus, not intractable  Doing well   4. Vitamin D deficiency  Resume vitamin D supplements  5. History of TIA (transient ischemic attack)  Discussed statin therapy   6. Dyslipidemia  Not on statin therapy, HDL is at goal   7. Metabolic syndrome   Doing well with life  style modification

## 2023-03-16 ENCOUNTER — Other Ambulatory Visit: Payer: Self-pay | Admitting: Family Medicine

## 2023-03-16 ENCOUNTER — Encounter: Payer: Self-pay | Admitting: Family Medicine

## 2023-03-16 ENCOUNTER — Ambulatory Visit (INDEPENDENT_AMBULATORY_CARE_PROVIDER_SITE_OTHER): Payer: No Typology Code available for payment source | Admitting: Family Medicine

## 2023-03-16 VITALS — BP 118/76 | HR 79 | Resp 16 | Ht 61.0 in | Wt 189.0 lb

## 2023-03-16 DIAGNOSIS — E559 Vitamin D deficiency, unspecified: Secondary | ICD-10-CM

## 2023-03-16 DIAGNOSIS — G43409 Hemiplegic migraine, not intractable, without status migrainosus: Secondary | ICD-10-CM | POA: Diagnosis not present

## 2023-03-16 DIAGNOSIS — I10 Essential (primary) hypertension: Secondary | ICD-10-CM | POA: Diagnosis not present

## 2023-03-16 DIAGNOSIS — Z8673 Personal history of transient ischemic attack (TIA), and cerebral infarction without residual deficits: Secondary | ICD-10-CM

## 2023-03-16 DIAGNOSIS — E785 Hyperlipidemia, unspecified: Secondary | ICD-10-CM

## 2023-03-16 DIAGNOSIS — E8881 Metabolic syndrome: Secondary | ICD-10-CM

## 2023-03-16 DIAGNOSIS — K219 Gastro-esophageal reflux disease without esophagitis: Secondary | ICD-10-CM | POA: Diagnosis not present

## 2023-03-16 MED ORDER — OMEPRAZOLE 20 MG PO CPDR: 20.0000 mg | DELAYED_RELEASE_CAPSULE | Freq: Every day | ORAL | 3 refills | Status: DC

## 2023-03-16 MED ORDER — HYDROCHLOROTHIAZIDE 12.5 MG PO TABS: 12.5000 mg | ORAL_TABLET | Freq: Every day | ORAL | 3 refills | Status: DC

## 2023-03-25 ENCOUNTER — Other Ambulatory Visit: Payer: Self-pay | Admitting: Family Medicine

## 2023-03-25 DIAGNOSIS — I10 Essential (primary) hypertension: Secondary | ICD-10-CM

## 2023-03-25 NOTE — Telephone Encounter (Signed)
Patient called in and stated her insurance will not cover medication to be filled at the local pharmacy that it needs to be sent to the mail order pharmacy. Updated pharmacy in patients chart.Patient stated if you do the "E Sign" you have to include # [id number]    hydrochlorothiazide (HYDRODIURIL) 12.5 MG tablet    [facility] [address] [place], [place] [address]  FAX # [phone] PHONE # [phone]

## 2023-03-29 MED ORDER — HYDROCHLOROTHIAZIDE 12.5 MG PO TABS
12.5000 mg | ORAL_TABLET | Freq: Every day | ORAL | 3 refills | Status: DC
Start: 2023-03-29 — End: 2024-05-05

## 2023-03-29 NOTE — Telephone Encounter (Signed)
Resend to [facility] - [place], [place] - [address] .

## 2023-03-29 NOTE — Telephone Encounter (Signed)
Requested Prescriptions  Pending Prescriptions Disp Refills   hydrochlorothiazide (HYDRODIURIL) 12.5 MG tablet 90 tablet 3    Sig: Take 1 tablet (12.5 mg total) by mouth daily.     Cardiovascular: Diuretics - Thiazide Passed - 03/29/2023  9:55 AM      Passed - Cr in normal range and within 180 days    Creat  Date Value Ref Range Status  11/23/2022 0.89 0.50 - 1.05 mg/dL Final         Passed - K in normal range and within 180 days    Potassium  Date Value Ref Range Status  11/23/2022 4.5 3.5 - 5.3 mmol/L Final  02/07/2015 3.2 mmol/L Final         Passed - Na in normal range and within 180 days    Sodium  Date Value Ref Range Status  11/23/2022 141 135 - 146 mmol/L Final  11/05/2015 140 134 - 144 mmol/L Final  02/07/2015 139  Final         Passed - Last BP in normal range    BP Readings from Last 1 Encounters:  03/16/23 118/76         Passed - Valid encounter within last 6 months    Recent Outpatient Visits           1 week ago Hypertension, benign   [facility] Alba Cory, MD   3 months ago Well adult exam   [facility] Alba Cory, MD   4 months ago Hemiplegic migraine without status migrainosus, not intractable   [facility] Alba Cory, MD   5 months ago [employer] or callus   [facility], Oswaldo Conroy, PA-C   8 months ago No-show for appointment   [facility] Alba Cory, MD       Future Appointments             In 8 months Carlynn Purl, Danna Hefty, MD [facility]   In 11 months Alba Cory, MD [facility], [facility]

## 2023-03-29 NOTE — Telephone Encounter (Signed)
Pt following up on request to have her med re sent to a mail order.   Her insurance has changed and she must use mail order now. Pt states she called to the mail order, and they had not heard anything.  She is out of her  hydrochlorothiazide (HYDRODIURIL) 12.5 MG tablet.  Please re send asap to  [facility] - [place], [place] - [address]

## 2023-10-12 ENCOUNTER — Other Ambulatory Visit: Payer: Self-pay

## 2023-10-12 ENCOUNTER — Encounter: Payer: Self-pay | Admitting: Internal Medicine

## 2023-10-12 ENCOUNTER — Ambulatory Visit (INDEPENDENT_AMBULATORY_CARE_PROVIDER_SITE_OTHER): Payer: PRIVATE HEALTH INSURANCE | Admitting: Internal Medicine

## 2023-10-12 VITALS — BP 128/72 | HR 80 | Resp 16 | Ht 61.0 in | Wt 204.1 lb

## 2023-10-12 DIAGNOSIS — N6452 Nipple discharge: Secondary | ICD-10-CM | POA: Diagnosis not present

## 2023-10-12 NOTE — Progress Notes (Signed)
   Acute Office Visit  Subjective:     Patient ID: Monique Daniels, female    DOB: 1960-10-09, 63 y.o.   MRN: [id number]  Chief Complaint  Patient presents with   Breast Problem    Right nipple has discharge     HPI Patient is in today for discharge from the right nipple.  Patient states she first noticed this about 2 weeks ago, was cleaning her house when she felt a wet sensation around her nipple and noticed watery like discharge through her shirt.  She was unable to tell if there is any color to the discharge or if it was milky but denies bloody discharge.  She did have this back in 2019, same symptoms of right nipple discharge.  At the time mammogram and ultrasounds were negative.  She did have another ultrasound in May 2024 which was BI-RADS 1.  She does endorse some mild right breast tenderness, especially while working out but denies any skin changes, masses or lumps.    Review of Systems  Constitutional:  Negative for chills, fever and weight loss.        Objective:    BP 128/72 (Cuff Size: Large)   Pulse 80   Resp 16   Ht 5\' 1"  (1.549 m)   Wt 204 lb 1.6 oz (92.6 kg)   SpO2 99%   BMI 38.56 kg/m  BP Readings from Last 3 Encounters:  10/12/23 128/72  03/16/23 118/76  12/09/22 138/72   Wt Readings from Last 3 Encounters:  10/12/23 204 lb 1.6 oz (92.6 kg)  03/16/23 189 lb (85.7 kg)  12/09/22 183 lb (83 kg)      Physical Exam Exam conducted with a chaperone present.  Constitutional:      Appearance: Normal appearance.  HENT:     Head: Normocephalic and atraumatic.  Eyes:     Conjunctiva/sclera: Conjunctivae normal.  Cardiovascular:     Rate and Rhythm: Normal rate and regular rhythm.  Pulmonary:     Effort: Pulmonary effort is normal.     Breath sounds: Normal breath sounds.  Chest:  Breasts:    Right: Normal. No nipple discharge.     Left: Normal.  Lymphadenopathy:     Upper Body:     Right upper body: No supraclavicular, axillary or pectoral  adenopathy.     Left upper body: No supraclavicular, axillary or pectoral adenopathy.  Skin:    General: Skin is warm and dry.  Neurological:     General: No focal deficit present.     Mental Status: She is alert. Mental status is at baseline.  Psychiatric:        Mood and Affect: Mood normal.        Behavior: Behavior normal.     No results found for any visits on 10/12/23.      Assessment & Plan:   1. Discharge from right nipple (Primary): Bilateral breast exam normal.  I am concerned that the discharge is unilateral and reoccurring.  Will assess thyroid function, renal function and check prolactin today.  Will also repeat bilateral breast ultrasound and mammogram.  - MM 3D SCREENING MAMMOGRAM BILATERAL BREAST; Future - [place] LIMITED ULTRASOUND INCLUDING AXILLA LEFT BREAST ; Future - [place] LIMITED ULTRASOUND INCLUDING AXILLA RIGHT BREAST; Future - Prolactin - TSH - COMPLETE METABOLIC PANEL WITH GFR  Return if symptoms worsen or fail to improve.  Margarita Mail, DO

## 2023-10-13 LAB — COMPLETE METABOLIC PANEL WITH GFR
AG Ratio: 1.6 (calc) (ref 1.0–2.5)
ALT: 16 U/L (ref 6–29)
AST: 16 U/L (ref 10–35)
Albumin: 3.8 g/dL (ref 3.6–5.1)
Alkaline phosphatase (APISO): 51 U/L (ref 37–153)
BUN: 13 mg/dL (ref 7–25)
CO2: 28 mmol/L (ref 20–32)
Calcium: 8.8 mg/dL (ref 8.6–10.4)
Chloride: 106 mmol/L (ref 98–110)
Creat: 1.02 mg/dL (ref 0.50–1.05)
Globulin: 2.4 g/dL (ref 1.9–3.7)
Glucose, Bld: 92 mg/dL (ref 65–99)
Potassium: 4.3 mmol/L (ref 3.5–5.3)
Sodium: 139 mmol/L (ref 135–146)
Total Bilirubin: 0.3 mg/dL (ref 0.2–1.2)
Total Protein: 6.2 g/dL (ref 6.1–8.1)
eGFR: 63 mL/min/{1.73_m2} (ref >=60)

## 2023-10-13 LAB — PROLACTIN: Prolactin: 9.7 ng/mL

## 2023-10-13 LAB — TSH: TSH: 2.01 m[IU]/L (ref 0.40–4.50)

## 2023-11-03 ENCOUNTER — Ambulatory Visit
Admission: RE | Admit: 2023-11-03 | Discharge: 2023-11-03 | Disposition: A | Discharge status: Home / Self Care | Payer: PRIVATE HEALTH INSURANCE | Source: Ambulatory Visit | Attending: Internal Medicine | Admitting: Internal Medicine

## 2023-11-03 DIAGNOSIS — N6452 Nipple discharge: Secondary | ICD-10-CM | POA: Insufficient documentation

## 2023-11-23 ENCOUNTER — Ambulatory Visit: Payer: Self-pay | Admitting: Family Medicine

## 2023-12-14 ENCOUNTER — Ambulatory Visit (INDEPENDENT_AMBULATORY_CARE_PROVIDER_SITE_OTHER): Payer: PRIVATE HEALTH INSURANCE | Admitting: Family Medicine

## 2023-12-14 ENCOUNTER — Encounter: Payer: Self-pay | Admitting: Family Medicine

## 2023-12-14 VITALS — BP 128/84 | HR 69 | Resp 16 | Ht 61.0 in | Wt 200.7 lb

## 2023-12-14 DIAGNOSIS — E559 Vitamin D deficiency, unspecified: Secondary | ICD-10-CM | POA: Diagnosis not present

## 2023-12-14 DIAGNOSIS — Z0001 Encounter for general adult medical examination with abnormal findings: Secondary | ICD-10-CM | POA: Diagnosis not present

## 2023-12-14 DIAGNOSIS — Z1211 Encounter for screening for malignant neoplasm of colon: Secondary | ICD-10-CM

## 2023-12-14 DIAGNOSIS — M2560 Stiffness of unspecified joint, not elsewhere classified: Secondary | ICD-10-CM

## 2023-12-14 DIAGNOSIS — R5383 Other fatigue: Secondary | ICD-10-CM | POA: Diagnosis not present

## 2023-12-14 DIAGNOSIS — Z0000 Encounter for general adult medical examination without abnormal findings: Secondary | ICD-10-CM

## 2023-12-14 DIAGNOSIS — E785 Hyperlipidemia, unspecified: Secondary | ICD-10-CM

## 2023-12-14 NOTE — Progress Notes (Addendum)
 Name: Monique Daniels   MRN: [id number]    DOB: May 25, 1961   Date:12/14/2023       Progress Note  Subjective  Chief Complaint  Chief Complaint  Patient presents with   Annual Exam    HPI  Patient presents for annual CPE and follow up  Discussed the use of AI scribe software for clinical note transcription with the patient, who gave verbal consent to proceed.  History of Present Illness Monique Daniels is a 63 year old female who presents with nipple discharge.  She experienced a random nipple discharge lasting for two weeks. A workup in March included normal prolactin levels, thyroid function, blood sugar, and kidney function. A mammogram on April 2nd showed no abnormalities, and an ultrasound of the right side was conducted. She has not noticed any further issues since then.  She has a history of vitamin D  deficiency noted last year and has not been taking her vitamin D  supplements regularly. Her A1c levels have been normal since 2020, with previous elevated levels in 2017 and 2019. Her cholesterol increased slightly from 113 to 133 last year.   She is due for a colonoscopy in December, as it has been ten years since her last one. She has a family history of cancer, with her grandfather having had prostate cancer and her father having died from an unspecified cancer.  She reports a decrease in physical activity over the past four months due to back and right hip pain, which has since improved. She has resumed going to the gym, engaging in walking and weight training five days a week. Her weight has decreased by four pounds since March.  She expresses concerns about hormonal changes, feeling tired, and considering estrogen therapy. She previously used an estrogen patch and progesterone pill but has not used them for a couple of years. She experiences vaginal dryness and occasional dyspareunia, as well as urinary incontinence when coughing hard.  She experiences stiffness and  fatigue, particularly when getting up, which persists throughout the day. She drinks about a gallon of water daily and has been trying natural remedies like turmeric, ginger, lemon water, and honey. She is discouraged by her stiffness, which affects her ability to exercise and perform daily activities.  She occasionally experiences nausea lasting about fifteen minutes, which occurs periodically without a clear trigger. She manages it by sipping water. No chest pain, palpitations, or vomiting.    Diet: balanced diet  Exercise: she is going to the gym  -5 days a week.  Last Eye Exam: completed Last Dental Exam: completed  Flowsheet Row Office Visit from 10/12/2023 in [facility]  AUDIT-C Score 0      Depression: Phq 9 is  positive    12/14/2023    9:22 AM 10/12/2023    8:03 AM 03/16/2023    8:52 AM 12/09/2022    9:25 AM 11/23/2022    8:18 AM  Depression screen PHQ 2/9  Decreased Interest 1 0 0 0 0  Down, Depressed, Hopeless 1 0 0 0 0  PHQ - 2 Score 2 0 0 0 0  Altered sleeping 0  0 0 0  Tired, decreased energy 1  0 0 0  Change in appetite 0  0 0 0  Feeling bad or failure about yourself  1  0 0 0  Trouble concentrating 1  0 0 0  Moving slowly or fidgety/restless 0  0 0 0  Suicidal thoughts 0  0 0 0  PHQ-9 Score 5  0 0 0  Difficult doing work/chores Somewhat difficult       Hypertension: BP Readings from Last 3 Encounters:  12/14/23 128/84  10/12/23 128/72  03/16/23 118/76   Obesity: Wt Readings from Last 3 Encounters:  12/14/23 200 lb 11.2 oz (91 kg)  10/12/23 204 lb 1.6 oz (92.6 kg)  03/16/23 189 lb (85.7 kg)   BMI Readings from Last 3 Encounters:  12/14/23 37.92 kg/m  10/12/23 38.56 kg/m  03/16/23 35.71 kg/m     Vaccines: reviewed with the patient.   Hep C Screening: completed STD testing and prevention (HIV/chl/gon/syphilis): N/A Intimate partner violence: negative screen  Sexual History :  vaginal dryness, uses lube, sometimes painful   Menstrual History/LMP/Abnormal Bleeding: post menopausal  Discussed importance of follow up if any post-menopausal bleeding: yes  Incontinence Symptoms: positive when coughing hard, mild symptoms   Breast cancer:  - Last Mammogram: up to date  - BRCA gene screening: N/A  Osteoporosis Prevention : Discussed high calcium and vitamin D  supplementation, weight bearing exercises Bone density :she will call insurance to find out if covered    Cervical cancer screening: up-to-date  Skin cancer: Discussed monitoring for atypical lesions  Colorectal cancer: due end of 2025    Lung cancer:  Low Dose CT Chest recommended if Age 49-80 years, 20 pack-year currently smoking OR have quit w/in 15years. Patient does not qualify for screen   ECG: 2018   Advanced Care Planning: A voluntary discussion about advance care planning including the explanation and discussion of advance directives.  Discussed health care proxy and Living will, and the patient was able to identify a health care proxy as husband .  Patient does not have a living will and power of attorney of health care   Patient Active Problem List   Diagnosis Date Noted   Metabolic syndrome 10/10/2018   Cervical spinal stenosis 04/08/2016   Prediabetes 01/09/2016   Dyslipidemia 10/03/2015   Seasonal allergic rhinitis 04/29/2015   History of esophageal stricture 01/28/2015   Atypical nevus 01/27/2015   Carpal tunnel syndrome 01/27/2015   Insomnia, persistent 01/27/2015   DDD (degenerative disc disease), cervical 01/27/2015   Panic attacks 01/27/2015   Gastric reflux 01/27/2015   Headache, hemiplegic migraine 01/27/2015   H/O transient cerebral ischemia 01/27/2015   Hypertension, benign 01/27/2015   Calculus of kidney 01/27/2015   Adult BMI 30+ 01/27/2015   Vitamin D  deficiency 01/27/2015   Snores 01/27/2015    Past Surgical History:  Procedure Laterality Date   CESAREAN SECTION  1980   HYSTEROSCOPY WITH D & C N/A 02/07/2015    Procedure: DILATATION AND CURETTAGE /HYSTEROSCOPY;  Surgeon: Kris Pester, MD;  Location: [facility] ORS;  Service: Gynecology;  Laterality: N/A;   HYSTEROSCOPY WITH NOVASURE N/A 02/07/2015   Procedure: HYSTEROSCOPY WITH NOVASURE;  Surgeon: Kris Pester, MD;  Location: [facility] OR[facility];  Service: Gynecology;  Laterality: N/A;   TOOTH EXTRACTION     TUBAL LIGATION  1989    Family History  Problem Relation Age of Onset   Asthma Mother    Heart disease Mother    Hyperlipidemia Mother    Emphysema Mother    Tremor Mother    Macular degeneration Mother    Asthma Son    Heart attack Father    Cancer Father        Unknown   Diabetes Father    Urolithiasis Son    Tremor Sister    Tremor Brother  Macular degeneration Maternal Grandmother    Alzheimer's disease Paternal Grandfather    Breast cancer Neg Hx     Social History   Socioeconomic History   Marital status: Married    Spouse name: Dee Farber   Number of children: 2   Years of education: 12th   Highest education level: 12th grade  Occupational History   Occupation: Administrator records    Employer: [employer]  Tobacco Use   Smoking status: Never   Smokeless tobacco: Never  Vaping Use   Vaping status: Never Used  Substance and Sexual Activity   Alcohol use: Not Currently    Comment: used to drink wine very seldom    Drug use: No   Sexual activity: Yes    Partners: Male    Birth control/protection: Post-menopausal    Comment: BTL  Other Topics Concern   Not on file  Social History Narrative   Patient lives at home with husband Dee Farber.    Patient works at [employer].    Patient has a high school education.    Patient has 2 children.    Social Drivers of Corporate investment banker Strain: Low Risk  (10/10/2023)   Overall Financial Resource Strain (CARDIA)    Difficulty of Paying Living Expenses: Not hard at all  Food Insecurity: No Food Insecurity (10/10/2023)   Hunger Vital Sign    Worried About  Running Out of Food in the Last Year: Never true    Ran Out of Food in the Last Year: Never true  Transportation Needs: No Transportation Needs (10/10/2023)   PRAPARE - Administrator, Civil Service (Medical): No    Lack of Transportation (Non-Medical): No  Physical Activity: Sufficiently Active (10/10/2023)   Exercise Vital Sign    Days of Exercise per Week: 5 days    Minutes of Exercise per Session: 60 min  Stress: No Stress Concern Present (10/10/2023)   [employer] of Occupational Health - Occupational Stress Questionnaire    Feeling of Stress : Only a little  Social Connections: Socially Integrated (10/10/2023)   Social Connection and Isolation Panel [NHANES]    Frequency of Communication with Friends and Family: Three times a week    Frequency of Social Gatherings with Friends and Family: Once a week    Attends Religious Services: 1 to 4 times per year    Active Member of [employer] or Organizations: Yes    Attends Banker Meetings: 1 to 4 times per year    Marital Status: Married  Catering manager Violence: Not At Risk (12/14/2023)   Humiliation, Afraid, Rape, and Kick questionnaire    Fear of Current or Ex-Partner: No    Emotionally Abused: No    Physically Abused: No    Sexually Abused: No     Current Outpatient Medications:    Ascorbic Acid (VITAMIN C) POWD, Take 500 mg by mouth., Disp: , Rfl:    aspirin -acetaminophen -caffeine (EXCEDRIN MIGRAINE) 250-250-65 MG per tablet, Take 2 tablets by mouth every 6 (six) hours as needed for pain., Disp: , Rfl:    Cholecalciferol (VITAMIN D ) 2000 units tablet, Take 2,000 Units by mouth daily., Disp: , Rfl:    cyanocobalamin  (VITAMIN B12) 500 MCG tablet, Take 500 mcg by mouth daily., Disp: , Rfl:    hydrochlorothiazide  (HYDRODIURIL ) 12.5 MG tablet, Take 1 tablet (12.5 mg total) by mouth daily., Disp: 90 tablet, Rfl: 3   omeprazole  (PRILOSEC) 20 MG capsule, Take 1 capsule (20 mg total)  by mouth daily., Disp: 90 capsule,  Rfl: 3   pyridoxine (B-6) 500 MG tablet, Take 500 mg by mouth daily., Disp: , Rfl:   Allergies  Allergen Reactions   Claritin  [Loratadine] Other (See Comments)   Codeine Other (See Comments)    Severe headache    Entex Lq  [Phenylephrine-Guaifenesin] Other (See Comments)   Fexofenadine Other (See Comments)   Loratadine-Pseudoephedrine Er Other (See Comments)   Pseudoephedrine Other (See Comments)   Tramadol Nausea Only     ROS  Constitutional: Negative for fever or weight change.  Respiratory: Negative for cough and shortness of breath.   Cardiovascular: Negative for chest pain or palpitations.  Gastrointestinal: Negative for abdominal pain, no bowel changes.  Musculoskeletal: Negative for gait problem or joint swelling.  Skin: Negative for rash.  Neurological: Negative for dizziness or headache.  No other specific complaints in a complete review of systems (except as listed in HPI above).   Objective  Vitals:   12/14/23 0929  BP: 128/84  Pulse: 69  Resp: 16  SpO2: 98%  Weight: 200 lb 11.2 oz (91 kg)  Height: 5\' 1"  (1.549 m)    Body mass index is 37.92 kg/m.  Physical Exam  Constitutional: Patient appears well-developed and well-nourished. No distress.  HENT: Head: Normocephalic and atraumatic. Ears: B TMs ok, no erythema or effusion; Nose: Nose normal. Mouth/Throat: Oropharynx is clear and moist. No oropharyngeal exudate.  Eyes: Conjunctivae and EOM are normal. Pupils are equal, round, and reactive to light. No scleral icterus.  Neck: Normal range of motion. Neck supple. No JVD present. No thyromegaly present.  Cardiovascular: Normal rate, regular rhythm and normal heart sounds.  No murmur heard. No BLE edema. Pulmonary/Chest: Effort normal and breath sounds normal. No respiratory distress. Abdominal: Soft. Bowel sounds are normal, no distension. There is no tenderness. no masses Breast: not done  FEMALE GENITALIA:  Not done  RECTAL: not done  Musculoskeletal:  Normal range of motion, no joint effusions. No gross deformities Neurological: he is alert and oriented to person, place, and time. No cranial nerve deficit. Coordination, balance, strength, speech and gait are normal.  Skin: Skin is warm and dry. No rash noted. No erythema.  Psychiatric: Patient has a normal mood and affect. behavior is normal. Judgment and thought content normal.     Assessment & Plan Nipple discharge Symptoms resolved. Normal prolactin, thyroid, and imaging. Advised further evaluation if symptoms recur or become bloody due to ductal cancer risk. - Monitor for recurrence or bloody discharge. - Consider MRI or surgical referral if symptoms recur.  Postmenopausal symptoms Discussed estrogen therapy risks at age 70 and alternatives like black cohosh and duloxetine for symptoms and chronic pain. - Consider black cohosh for hot flashes. - Discuss estrogen therapy with Dr. Krista Peters if desired. - Consider duloxetine for symptoms and chronic pain.  Joint stiffness Stiffness throughout the day for four months. Investigating inflammatory causes with lab tests. - Order CRP, sed rate, and ANA.  Urinary incontinence Incontinence with hard coughing, especially during colds. No regular incontinence noted.  Dyslipidemia:  - on lifestyle modification only  Nausea Occasional, short-lived nausea without specific triggers, resolves spontaneously.  General Health Maintenance Low vitamin D  last year, weight loss noted, colonoscopy due, and bone density screening discussed. - Take 2000 units of vitamin D  daily. - Referral for colonoscopy in December. - Consider bone density screening if covered by insurance.  Goals of Care Discussed advanced directives and living will importance. Husband designated for medical decisions. -  Consider establishing a living will.  Follow-up Discussed follow-up for health management and lab results. - Follow up in six months for lab results and joint  stiffness.     -USPSTF grade A and B recommendations reviewed with patient; age-appropriate recommendations, preventive care, screening tests, etc discussed and encouraged; healthy living encouraged; see AVS for patient education given to patient -Discussed importance of 150 minutes of physical activity weekly, eat two servings of fish weekly, eat one serving of tree nuts ( cashews, pistachios, pecans, almonds.Aaron Aas) every other day, eat 6 servings of fruit/vegetables daily and drink plenty of water and avoid sweet beverages.   -Reviewed Health Maintenance: Yes.

## 2023-12-14 NOTE — Patient Instructions (Signed)
 Preventive Care 16-61 Years Old, Female  Preventive care refers to lifestyle choices and visits with your health care provider that can promote health and wellness. Preventive care visits are also called wellness exams.  What can I expect for my preventive care visit?  Counseling  Your health care provider may ask you questions about your:  Medical history, including:  Past medical problems.  Family medical history.  Pregnancy history.  Current health, including:  Menstrual cycle.  Method of birth control.  Emotional well-being.  Home life and relationship well-being.  Sexual activity and sexual health.  Lifestyle, including:  Alcohol, nicotine or tobacco, and drug use.  Access to firearms.  Diet, exercise, and sleep habits.  Work and work Astronomer.  Sunscreen use.  Safety issues such as seatbelt and bike helmet use.  Physical exam  Your health care provider will check your:  Height and weight. These may be used to calculate your BMI (body mass index). BMI is a measurement that tells if you are at a healthy weight.  Waist circumference. This measures the distance around your waistline. This measurement also tells if you are at a healthy weight and may help predict your risk of certain diseases, such as type 2 diabetes and high blood pressure.  Heart rate and blood pressure.  Body temperature.  Skin for abnormal spots.  What immunizations do I need?    Vaccines are usually given at various ages, according to a schedule. Your health care provider will recommend vaccines for you based on your age, medical history, and lifestyle or other factors, such as travel or where you work.  What tests do I need?  Screening  Your health care provider may recommend screening tests for certain conditions. This may include:  Lipid and cholesterol levels.  Diabetes screening. This is done by checking your blood sugar (glucose) after you have not eaten for a while (fasting).  Pelvic exam and Pap test.  Hepatitis B test.  Hepatitis C  test.  HIV (human immunodeficiency virus) test.  STI (sexually transmitted infection) testing, if you are at risk.  Lung cancer screening.  Colorectal cancer screening.  Mammogram. Talk with your health care provider about when you should start having regular mammograms. This may depend on whether you have a family history of breast cancer.  BRCA-related cancer screening. This may be done if you have a family history of breast, ovarian, tubal, or peritoneal cancers.  Bone density scan. This is done to screen for osteoporosis.  Talk with your health care provider about your test results, treatment options, and if necessary, the need for more tests.  Follow these instructions at home:  Eating and drinking    Eat a diet that includes fresh fruits and vegetables, whole grains, lean protein, and low-fat dairy products.  Take vitamin and mineral supplements as recommended by your health care provider.  Do not drink alcohol if:  Your health care provider tells you not to drink.  You are pregnant, may be pregnant, or are planning to become pregnant.  If you drink alcohol:  Limit how much you have to 0-1 drink a day.  Know how much alcohol is in your drink. In the [place] one drink equals one 12 oz bottle of beer (355 mL), one 5 oz glass of wine (148 mL), or one 1 oz glass of hard liquor (44 mL).  Lifestyle  Brush your teeth every morning and night with fluoride toothpaste. Floss one time each day.  Exercise for at least  30 minutes 5 or more days each week.  Do not use any products that contain nicotine or tobacco. These products include cigarettes, chewing tobacco, and vaping devices, such as e-cigarettes. If you need help quitting, ask your health care provider.  Do not use drugs.  If you are sexually active, practice safe sex. Use a condom or other form of protection to prevent STIs.  If you do not wish to become pregnant, use a form of birth control. If you plan to become pregnant, see your health care provider for a  prepregnancy visit.  Take aspirin only as told by your health care provider. Make sure that you understand how much to take and what form to take. Work with your health care provider to find out whether it is safe and beneficial for you to take aspirin daily.  Find healthy ways to manage stress, such as:  Meditation, yoga, or listening to music.  Journaling.  Talking to a trusted person.  Spending time with friends and family.  Minimize exposure to UV radiation to reduce your risk of skin cancer.  Safety  Always wear your seat belt while driving or riding in a vehicle.  Do not drive:  If you have been drinking alcohol. Do not ride with someone who has been drinking.  When you are tired or distracted.  While texting.  If you have been using any mind-altering substances or drugs.  Wear a helmet and other protective equipment during sports activities.  If you have firearms in your house, make sure you follow all gun safety procedures.  Seek help if you have been physically or sexually abused.  What's next?  Visit your health care provider once a year for an annual wellness visit.  Ask your health care provider how often you should have your eyes and teeth checked.  Stay up to date on all vaccines.  This information is not intended to replace advice given to you by your health care provider. Make sure you discuss any questions you have with your health care provider.  Document Revised: 01/15/2021 Document Reviewed: 01/15/2021  Elsevier Patient Education  2024 [employer].

## 2023-12-16 LAB — ANA: Anti Nuclear Antibody (ANA): NEGATIVE

## 2023-12-16 LAB — RHEUMATOID FACTOR: Rheumatoid fact SerPl-aCnc: <10 [IU]/mL (ref <14)

## 2023-12-16 LAB — C-REACTIVE PROTEIN: CRP: <3 mg/L (ref <8.0)

## 2023-12-16 LAB — CYCLIC CITRUL PEPTIDE ANTIBODY, IGG: Cyclic Citrullin Peptide Ab: <16 U

## 2023-12-16 LAB — SEDIMENTATION RATE: Sed Rate: 14 mm/h (ref 0–30)

## 2023-12-17 ENCOUNTER — Ambulatory Visit: Payer: Self-pay | Admitting: Family Medicine

## 2023-12-24 ENCOUNTER — Telehealth: Payer: Self-pay

## 2023-12-24 NOTE — Telephone Encounter (Signed)
 LVM for patient to let her know that since her colonoscopy is not due until November, I plan on mailing her a reminder letter to call our office closer to the month of November for her to call and schedule.  Thanks,  [place], CMA

## 2024-03-16 ENCOUNTER — Ambulatory Visit: Payer: Self-pay | Admitting: Family Medicine

## 2024-05-05 ENCOUNTER — Telehealth: Payer: Self-pay | Admitting: Family Medicine

## 2024-05-05 DIAGNOSIS — I10 Essential (primary) hypertension: Secondary | ICD-10-CM

## 2024-05-05 MED ORDER — HYDROCHLOROTHIAZIDE 12.5 MG PO TABS: 12.5000 mg | ORAL_TABLET | Freq: Every day | ORAL | 0 refills | Status: DC

## 2024-05-05 NOTE — Telephone Encounter (Signed)
 hydrochlorothiazide  (HYDRODIURIL ) 12.5 MG tablet   #90 tab

## 2024-05-05 NOTE — Telephone Encounter (Signed)
 30 day until pt is seen

## 2024-05-09 ENCOUNTER — Ambulatory Visit (INDEPENDENT_AMBULATORY_CARE_PROVIDER_SITE_OTHER): Payer: PRIVATE HEALTH INSURANCE | Admitting: Family Medicine

## 2024-05-09 ENCOUNTER — Encounter: Payer: Self-pay | Admitting: Family Medicine

## 2024-05-09 VITALS — BP 134/78 | HR 78 | Resp 16 | Ht 61.0 in | Wt 201.5 lb

## 2024-05-09 DIAGNOSIS — I10 Essential (primary) hypertension: Secondary | ICD-10-CM | POA: Diagnosis not present

## 2024-05-09 DIAGNOSIS — E559 Vitamin D deficiency, unspecified: Secondary | ICD-10-CM

## 2024-05-09 DIAGNOSIS — E8881 Metabolic syndrome: Secondary | ICD-10-CM

## 2024-05-09 DIAGNOSIS — K219 Gastro-esophageal reflux disease without esophagitis: Secondary | ICD-10-CM

## 2024-05-09 DIAGNOSIS — E785 Hyperlipidemia, unspecified: Secondary | ICD-10-CM | POA: Diagnosis not present

## 2024-05-09 DIAGNOSIS — I8393 Asymptomatic varicose veins of bilateral lower extremities: Secondary | ICD-10-CM

## 2024-05-09 MED ORDER — LISINOPRIL 2.5 MG PO TABS: 2.5000 mg | ORAL_TABLET | Freq: Every day | ORAL | 1 refills | Status: AC

## 2024-05-09 MED ORDER — OMEPRAZOLE 20 MG PO CPDR: 20.0000 mg | DELAYED_RELEASE_CAPSULE | Freq: Every day | ORAL | 3 refills | Status: AC

## 2024-05-09 NOTE — Progress Notes (Signed)
 Name: Monique Daniels   MRN: [id number]    DOB: 1960-11-18   Date:05/09/2024       Progress Note  Subjective  Chief Complaint  Chief Complaint  Patient presents with   Medical Management of Chronic Issues   Discussed the use of AI scribe software for clinical note transcription with the patient, who gave verbal consent to proceed.  History of Present Illness Monique Daniels is a 63 year old female with hypertension who presents for blood pressure management.  Her blood pressure was previously well-controlled on hydrochlorothiazide , but she discontinued it due to hypotension and associated symptoms of fatigue and lightheadedness when resuming gym activities. Her current blood pressure is 134/78, which is higher than her previous reading of 128/84. No chest pain or palpitations are present.  She is not currently on cholesterol medications despite a history of dyslipidemia, with a last recorded LDL of 133.  She is concerned about her weight, noting an increase from the 170s to 201 pounds despite regular gym attendance and weight training. She has a history of weight fluctuations, with her highest weight being around 218 pounds back in 2016 , and attributes some challenges to hormonal changes and low estrogen levels.  She experiences leg aches, particularly after prolonged standing or activity, described as a heavy, aching sensation that improves with ibuprofen  and rest. She has not tried compression stockings.  She takes omeprazole  daily for heartburn and indigestion and is mindful of her carbohydrate intake due to metabolic syndrome. She also takes a vitamin D  supplement. Her A1c has improved over the past year.   The 10-year ASCVD risk score (Arnett DK, et al., 2019) is: 3.9%   Values used to calculate the score:     Age: 77 years     Clincally relevant sex: Female     Is Non-Hispanic African American: No     Diabetic: No     Tobacco smoker: No     Systolic Blood Pressure: 134  mmHg     Is BP treated: No     HDL Cholesterol: 64 mg/dL     Total Cholesterol: 218 mg/dL  Patient Active Problem List   Diagnosis Date Noted   Metabolic syndrome 10/10/2018   Cervical spinal stenosis 04/08/2016   Prediabetes 01/09/2016   Dyslipidemia 10/03/2015   Seasonal allergic rhinitis 04/29/2015   History of esophageal stricture 01/28/2015   Atypical nevus 01/27/2015   Carpal tunnel syndrome 01/27/2015   Insomnia, persistent 01/27/2015   DDD (degenerative disc disease), cervical 01/27/2015   Panic attacks 01/27/2015   Gastric reflux 01/27/2015   Headache, hemiplegic migraine 01/27/2015   H/O transient cerebral ischemia 01/27/2015   Hypertension, benign 01/27/2015   Calculus of kidney 01/27/2015   Adult BMI 30+ 01/27/2015   Vitamin D  deficiency 01/27/2015   Snores 01/27/2015    Past Surgical History:  Procedure Laterality Date   CESAREAN SECTION  1980   HYSTEROSCOPY WITH D & C N/A 02/07/2015   Procedure: DILATATION AND CURETTAGE /HYSTEROSCOPY;  Surgeon: Garnette JONETTA Mace, MD;  Location: [facility];  Service: Gynecology;  Laterality: N/A;   HYSTEROSCOPY WITH NOVASURE N/A 02/07/2015   Procedure: HYSTEROSCOPY WITH NOVASURE;  Surgeon: Garnette JONETTA Mace, MD;  Location: [facility];  Service: Gynecology;  Laterality: N/A;   TOOTH EXTRACTION     TUBAL LIGATION  1989    Family History  Problem Relation Age of Onset   Asthma Mother    Heart disease Mother    Hyperlipidemia Mother  Emphysema Mother    Tremor Mother    Macular degeneration Mother    Asthma Son    Heart attack Father    Cancer Father        Unknown   Diabetes Father    Urolithiasis Son    Tremor Sister    Tremor Brother    Macular degeneration Maternal Grandmother    Alzheimer's disease Paternal Grandfather    Breast cancer Neg Hx     Social History   Tobacco Use   Smoking status: Never   Smokeless tobacco: Never  Substance Use Topics   Alcohol use: Not Currently    Comment: used to drink wine  very seldom      Current Outpatient Medications:    Ascorbic Acid (VITAMIN C) POWD, Take 500 mg by mouth., Disp: , Rfl:    aspirin -acetaminophen -caffeine (EXCEDRIN MIGRAINE) 250-250-65 MG per tablet, Take 2 tablets by mouth every 6 (six) hours as needed for pain., Disp: , Rfl:    Cholecalciferol (VITAMIN D ) 2000 units tablet, Take 2,000 Units by mouth daily., Disp: , Rfl:    cyanocobalamin  (VITAMIN B12) 500 MCG tablet, Take 500 mcg by mouth daily., Disp: , Rfl:    hydrochlorothiazide  (HYDRODIURIL ) 12.5 MG tablet, Take 1 tablet (12.5 mg total) by mouth daily., Disp: 90 tablet, Rfl: 0   omeprazole  (PRILOSEC) 20 MG capsule, Take 1 capsule (20 mg total) by mouth daily., Disp: 90 capsule, Rfl: 3   pyridoxine (B-6) 500 MG tablet, Take 500 mg by mouth daily., Disp: , Rfl:   Allergies  Allergen Reactions   Claritin  [Loratadine] Other (See Comments)   Codeine Other (See Comments)    Severe headache    Entex Lq  [Phenylephrine-Guaifenesin] Other (See Comments)   Fexofenadine Other (See Comments)   Loratadine-Pseudoephedrine Er Other (See Comments)   Pseudoephedrine Other (See Comments)   Tramadol Nausea Only    I personally reviewed active problem list, medication list, allergies with the patient/caregiver today.   ROS  Ten systems reviewed and is negative except as mentioned in HPI    Objective Physical Exam VITALS: P- 63, BP- 134/78 MEASUREMENTS: Weight- 201. CONSTITUTIONAL: Patient appears well-developed and well-nourished. No distress. HEENT: Head atraumatic, normocephalic, neck supple. CARDIOVASCULAR: Normal rate, regular rhythm and normal heart sounds. No murmur heard. 1+ edema in extremities. PULMONARY: Effort normal and breath sounds normal. Lungs clear to auscultation bilaterally. No respiratory distress. ABDOMINAL: There is no tenderness or distention. MUSCULOSKELETAL: Normal gait. Without gross motor or sensory deficit. PSYCHIATRIC: Patient has a normal mood and affect.  Behavior is normal. Judgment and thought content normal.  Vitals:   05/09/24 1548  BP: 134/78  Pulse: 78  Resp: 16  SpO2: 95%  Weight: 201 lb 8 oz (91.4 kg)  Height: 5' 1 (1.549 m)    Body mass index is 38.07 kg/m.  PHQ2/9:    05/09/2024    3:47 PM 12/14/2023    9:22 AM 10/12/2023    8:03 AM 03/16/2023    8:52 AM 12/09/2022    9:25 AM  Depression screen PHQ 2/9  Decreased Interest 0 1 0 0 0  Down, Depressed, Hopeless 0 1 0 0 0  PHQ - 2 Score 0 2 0 0 0  Altered sleeping  0  0 0  Tired, decreased energy  1  0 0  Change in appetite  0  0 0  Feeling bad or failure about yourself   1  0 0  Trouble concentrating  1  0 0  Moving slowly or fidgety/restless  0  0 0  Suicidal thoughts  0  0 0  PHQ-9 Score  5  0 0  Difficult doing work/chores  Somewhat difficult       phq 9 is negative  Fall Risk:    05/09/2024    3:45 PM 12/14/2023    9:22 AM 10/12/2023    8:03 AM 03/16/2023    8:52 AM 12/09/2022    9:25 AM  Fall Risk   Falls in the past year? 0 0 0 0 0  Number falls in past yr: 0 0 0 0   Injury with Fall? 0 0 0 0   Risk for fall due to : No Fall Risks No Fall Risks No Fall Risks No Fall Risks No Fall Risks  Follow up Falls evaluation completed Falls prevention discussed;Education provided;Falls evaluation completed Falls evaluation completed Falls prevention discussed Falls prevention discussed;Education provided;Falls evaluation completed     Assessment & Plan Essential hypertension Blood pressure elevated at 134/78 mmHg. Previous HCTZ caused fatigue and lightheadedness. - Discontinue HCTZ. - Start lisinopril 2.5 mg daily. - Provide 68-month supply of lisinopril. - Schedule nurse visit for blood pressure check. - Monitor for lisinopril side effects, report if occur.  Metabolic syndrome Managed with lifestyle modifications. Improved A1c levels indicate better glucose control. - Continue lifestyle modifications, reduce carbohydrates. - Monitor weight and metabolic  parameters.  Hyperlipidemia Previous LDL 133 mg/dL. Low cardiovascular risk at 3.9%, no statin needed. - Monitor lipid levels. - Encourage lifestyle modifications.  Morbid obesity Weight 201 lbs, BMI >35 with co-morbidities   such as HTN and dyslipidemia. Weight management challenging despite efforts. - Consider weight loss medications if covered by insurance. - Encourage exercise, focus on weight training. - Discuss water aerobics for low-impact exercise.  Vitamin D  deficiency Previously low vitamin D  levels, currently supplemented with vitamin D  and K. - Continue vitamin D  supplementation. - Recheck vitamin D  levels.  Chronic leg pain likely related to venous insufficiency and spider veins Leg pain likely due to spider veins and venous insufficiency. - Consider referral to vascular specialist. - Recommend compression tights for support.  Gastroesophageal reflux disease (GERD) Managed with daily omeprazole . - Continue omeprazole  as prescribed.  General Health Maintenance - Ensure flu vaccination received at work.

## 2024-05-10 ENCOUNTER — Ambulatory Visit: Payer: Self-pay | Admitting: Family Medicine

## 2024-05-10 LAB — CBC WITH DIFFERENTIAL/PLATELET
Absolute Lymphocytes: 1499 {cells}/uL (ref 850–3900)
Absolute Monocytes: 490 {cells}/uL (ref 200–950)
Basophils Absolute: 61 {cells}/uL (ref 0–200)
Basophils Relative: 1.2 %
Eosinophils Absolute: 372 {cells}/uL (ref 15–500)
Eosinophils Relative: 7.3 %
HCT: 41.1 % (ref 35.0–45.0)
Hemoglobin: 13.2 g/dL (ref 11.7–15.5)
MCH: 27.9 pg (ref 27.0–33.0)
MCHC: 32.1 g/dL (ref 32.0–36.0)
MCV: 86.9 fL (ref 80.0–100.0)
MPV: 11 fL (ref 7.5–12.5)
Monocytes Relative: 9.6 %
Neutro Abs: 2678 {cells}/uL (ref 1500–7800)
Neutrophils Relative %: 52.5 %
Platelets: 250 Thousand/uL (ref 140–400)
RBC: 4.73 Million/uL (ref 3.80–5.10)
RDW: 13.4 % (ref 11.0–15.0)
Total Lymphocyte: 29.4 %
WBC: 5.1 Thousand/uL (ref 3.8–10.8)

## 2024-05-10 LAB — COMPREHENSIVE METABOLIC PANEL WITH GFR
AG Ratio: 1.5 (calc) (ref 1.0–2.5)
ALT: 21 U/L (ref 6–29)
AST: 19 U/L (ref 10–35)
Albumin: 3.9 g/dL (ref 3.6–5.1)
Alkaline phosphatase (APISO): 57 U/L (ref 37–153)
BUN: 20 mg/dL (ref 7–25)
CO2: 27 mmol/L (ref 20–32)
Calcium: 9.3 mg/dL (ref 8.6–10.4)
Chloride: 104 mmol/L (ref 98–110)
Creat: 1.05 mg/dL (ref 0.50–1.05)
Globulin: 2.6 g/dL (ref 1.9–3.7)
Glucose, Bld: 81 mg/dL (ref 65–139)
Potassium: 3.7 mmol/L (ref 3.5–5.3)
Sodium: 139 mmol/L (ref 135–146)
Total Bilirubin: 0.3 mg/dL (ref 0.2–1.2)
Total Protein: 6.5 g/dL (ref 6.1–8.1)
eGFR: 60 mL/min/1.73m2 (ref >=60)

## 2024-05-10 LAB — VITAMIN D 25 HYDROXY (VIT D DEFICIENCY, FRACTURES): Vit D, 25-Hydroxy: 41 ng/mL (ref 30–100)

## 2024-05-10 LAB — LIPID PANEL
Cholesterol: 190 mg/dL (ref <200)
HDL: 49 mg/dL — ABNORMAL LOW (ref >=50)
LDL Cholesterol (Calc): 118 mg/dL — ABNORMAL HIGH
Non-HDL Cholesterol (Calc): 141 mg/dL — ABNORMAL HIGH (ref <130)
Total CHOL/HDL Ratio: 3.9 (calc) (ref <5.0)
Triglycerides: 123 mg/dL (ref <150)

## 2024-05-10 LAB — HEMOGLOBIN A1C
Hgb A1c MFr Bld: 5.6 % (ref <5.7)
Mean Plasma Glucose: 114 mg/dL
eAG (mmol/L): 6.3 mmol/L

## 2024-08-07 ENCOUNTER — Telehealth: Payer: Self-pay | Admitting: Family Medicine

## 2024-08-07 NOTE — Telephone Encounter (Signed)
 Hydrochlorothiazide  12.5 90 tab

## 2024-08-07 NOTE — Telephone Encounter (Signed)
 Not on current med list.

## 2024-08-29 ENCOUNTER — Ambulatory Visit: Payer: Self-pay

## 2024-08-29 NOTE — Telephone Encounter (Signed)
 FYI Only or Action Required?: FYI only for provider: appointment scheduled on 08/30/2024.  Patient was last seen in primary care on 05/09/2024 by Glenard Mire, MD.  Called Nurse Triage reporting Mass.  Symptoms began about a month ago.  Interventions attempted: Nothing.  Symptoms are: unchanged.  Triage Disposition: See PCP When Office is Open (Within 3 Days)  Patient/caregiver understands and will follow disposition?: Yes  Message from [facility] sent at 08/29/2024  2:06 PM EST  Summary: swollen lymph node   Reason for Triage: pt called and stated that her lymph node on her right side of neck is swollen. Mouth is dry and taste for food has changed.         Reason for Disposition  [1] Small swelling or lump AND [2] unexplained AND [3] present > 1 week  Answer Assessment - Initial Assessment Questions 1. APPEARANCE of SWELLING: What does it look like?     Unable to see 2. SIZE: How large is the swelling? (e.g., inches, cm; or compare to size of pinhead, tip of pen, eraser, coin, pea, grape, ping pong ball)      Knot that is larger than a pea 3. LOCATION: Where is the swelling located?     Left neck 4. ONSET: When did the swelling start?     About four weeks ago 5. COLOR: What color is it? Is there more than one color?     Not seen 6. PAIN: Is there any pain? If Yes, ask: How bad is the pain? (Scale 1-10; or mild, moderate, severe)       Denies pain 7. ITCH: Does it itch? If Yes, ask: How bad is the itch?      denies 8. CAUSE: What do you think caused the swelling?     unsure 9 OTHER SYMPTOMS: Do you have any other symptoms? (e.g., fever)     Causes discomfort for biting/eating. Taste for food has changed as well  Protocols used: Skin Lump or Localized Swelling-A-AH

## 2024-08-30 ENCOUNTER — Ambulatory Visit: Payer: PRIVATE HEALTH INSURANCE | Admitting: Family Medicine

## 2024-08-30 VITALS — BP 132/76 | HR 62 | Resp 16 | Ht 61.0 in | Wt 203.1 lb

## 2024-08-30 DIAGNOSIS — R221 Localized swelling, mass and lump, neck: Secondary | ICD-10-CM | POA: Diagnosis not present

## 2024-08-30 NOTE — Progress Notes (Signed)
 Name: Monique Daniels   MRN: [id number]    DOB: 1960/08/28   Date:08/30/2024       Progress Note  Subjective  Chief Complaint  Chief Complaint  Patient presents with   Mass    R side of neck, Knot that is larger than a pea,   Causes discomfort for biting/eating. Taste for food has changed as well    Discussed the use of AI scribe software for clinical note transcription with the patient, who gave verbal consent to proceed.  History of Present Illness Monique Daniels is a 64 year old female who presents with swelling on the right side of her neck.  She noticed swelling on the right side of her neck yesterday. The swelling was initially more pronounced and has since decreased in size. She describes the swelling as a 'puffy feeling' and not hard. No associated pain.  No difficulty swallowing, dry mouth, toothache, or swelling in her arms or hands. No fever or chills. She took ibuprofen  earlier.  She has a history of Stenland-Jak disease, which involves visible veins on her chest and legs. She notes that these veins have been worsening over time.    Patient Active Problem List   Diagnosis Date Noted   Metabolic syndrome 10/10/2018   Cervical spinal stenosis 04/08/2016   Prediabetes 01/09/2016   Dyslipidemia 10/03/2015   Seasonal allergic rhinitis 04/29/2015   History of esophageal stricture 01/28/2015   Atypical nevus 01/27/2015   Carpal tunnel syndrome 01/27/2015   Insomnia, persistent 01/27/2015   DDD (degenerative disc disease), cervical 01/27/2015   Panic attacks 01/27/2015   Gastric reflux 01/27/2015   Headache, hemiplegic migraine 01/27/2015   H/O transient cerebral ischemia 01/27/2015   Hypertension, benign 01/27/2015   Calculus of kidney 01/27/2015   Adult BMI 30+ 01/27/2015   Vitamin D  deficiency 01/27/2015   Snores 01/27/2015    Social History   Tobacco Use   Smoking status: Never   Smokeless tobacco: Never  Substance Use Topics   Alcohol use: Not  Currently    Comment: used to drink wine very seldom     Current Medications[1]  Allergies[2]  ROS  Ten systems reviewed and is negative except as mentioned in HPI    Objective  Vitals:   08/30/24 1537  BP: 132/76  Pulse: 62  Resp: 16  SpO2: 98%  Weight: 203 lb 1.6 oz (92.1 kg)  Height: 5' 1 (1.549 m)    Body mass index is 38.38 kg/m.   Physical Exam  Constitutional: Patient appears well-developed and well-nourished. Obese  No distress.  HEENT: head atraumatic, normocephalic, pupils equal and reactive to light,, neck supple, soft tissue mass on right side of neck, towards trapezium , above clavicle , no bruit . No thyromegaly  Cardiovascular: Normal rate, regular rhythm and normal heart sounds.  No murmur heard. No BLE edema. Pulmonary/Chest: Effort normal and breath sounds normal. No respiratory distress. Abdominal: Soft.  There is no tenderness. Psychiatric: Patient has a normal mood and affect. behavior is normal. Judgment and thought content normal.    Assessment & Plan Localized swelling, mass, or lump of the neck Localized swelling on the right side of the neck, likely lipoma or soft tissue mass. No thyroid involvement. Possible inflammation indicated by response to ibuprofen . Further evaluation needed to rule out vascular issues if soft tissue US  negative . - Ordered soft tissue ultrasound of the head and neck. - If inconclusive, will order vascular ultrasound.          [  1]  Current Outpatient Medications:    Ascorbic Acid (VITAMIN C) POWD, Take 500 mg by mouth., Disp: , Rfl:    aspirin -acetaminophen -caffeine (EXCEDRIN MIGRAINE) 250-250-65 MG per tablet, Take 2 tablets by mouth every 6 (six) hours as needed for pain., Disp: , Rfl:    Cholecalciferol (VITAMIN D ) 2000 units tablet, Take 2,000 Units by mouth daily., Disp: , Rfl:    cyanocobalamin  (VITAMIN B12) 500 MCG tablet, Take 500 mcg by mouth daily., Disp: , Rfl:    lisinopril  (ZESTRIL ) 2.5 MG tablet,  Take 1 tablet (2.5 mg total) by mouth daily., Disp: 90 tablet, Rfl: 1   omeprazole  (PRILOSEC) 20 MG capsule, Take 1 capsule (20 mg total) by mouth daily., Disp: 90 capsule, Rfl: 3   pyridoxine (B-6) 500 MG tablet, Take 500 mg by mouth daily., Disp: , Rfl:  [2]  Allergies Allergen Reactions   Claritin  [Loratadine] Other (See Comments)   Codeine Other (See Comments)    Severe headache    Entex Lq  [Phenylephrine-Guaifenesin] Other (See Comments)   Fexofenadine Other (See Comments)   Loratadine-Pseudoephedrine Er Other (See Comments)   Pseudoephedrine Other (See Comments)   Tramadol Nausea Only

## 2024-09-11 ENCOUNTER — Ambulatory Visit: Payer: PRIVATE HEALTH INSURANCE
